# Patient Record
Sex: Female | Born: 1980 | Race: Black or African American | Hispanic: No | Marital: Married | Smoking: Never smoker
Health system: Southern US, Community
[De-identification: ages and names within clinical notes are randomized; demographics above are authoritative.]

## PROBLEM LIST (undated history)

## (undated) DIAGNOSIS — Z9889 Other specified postprocedural states: Secondary | ICD-10-CM

## (undated) DIAGNOSIS — R590 Localized enlarged lymph nodes: Secondary | ICD-10-CM

## (undated) DIAGNOSIS — N361 Urethral diverticulum: Secondary | ICD-10-CM

## (undated) DIAGNOSIS — C8110 Nodular sclerosis classical Hodgkin lymphoma, unspecified site: Secondary | ICD-10-CM

## (undated) DIAGNOSIS — R519 Headache, unspecified: Secondary | ICD-10-CM

## (undated) DIAGNOSIS — Z889 Allergy status to unspecified drugs, medicaments and biological substances status: Secondary | ICD-10-CM

## (undated) DIAGNOSIS — N750 Cyst of Bartholin's gland: Secondary | ICD-10-CM

## (undated) DIAGNOSIS — R112 Nausea with vomiting, unspecified: Secondary | ICD-10-CM

## (undated) DIAGNOSIS — D649 Anemia, unspecified: Secondary | ICD-10-CM

## (undated) DIAGNOSIS — C8190 Hodgkin lymphoma, unspecified, unspecified site: Secondary | ICD-10-CM

## (undated) HISTORY — DX: Urethral diverticulum: N36.1

## (undated) HISTORY — PX: OTHER SURGICAL HISTORY: SHX169

---
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
## 2006-03-27 ENCOUNTER — Other Ambulatory Visit: Admission: RE | Admit: 2006-03-27 | Discharge: 2006-03-27 | Payer: Self-pay | Admitting: Obstetrics and Gynecology

## 2007-11-11 DIAGNOSIS — R519 Headache, unspecified: Secondary | ICD-10-CM | POA: Insufficient documentation

## 2007-11-11 DIAGNOSIS — J310 Chronic rhinitis: Secondary | ICD-10-CM | POA: Insufficient documentation

## 2007-11-12 DIAGNOSIS — R3915 Urgency of urination: Secondary | ICD-10-CM | POA: Insufficient documentation

## 2010-08-03 ENCOUNTER — Ambulatory Visit: Payer: Self-pay | Admitting: Physician Assistant

## 2010-08-03 LAB — CONVERTED CEMR LAB
Eosinophils Absolute: 0.1 10*3/uL (ref 0.0–0.7)
HIV: NONREACTIVE
Hepatitis B Surface Ag: NEGATIVE
Hgb A2 Quant: 2.5 % (ref 2.2–3.2)
Hgb S Quant: 0 % (ref 0.0–0.0)
Lymphocytes Relative: 32 % (ref 12–46)
Lymphs Abs: 2.9 10*3/uL (ref 0.7–4.0)
MCV: 93.2 fL (ref 78.0–100.0)
Neutrophils Relative %: 62 % (ref 43–77)
Nitrite: NEGATIVE
Platelets: 244 10*3/uL (ref 150–400)
Protein, ur: NEGATIVE mg/dL
Urine Glucose: NEGATIVE mg/dL
Urobilinogen, UA: 1 (ref 0.0–1.0)
WBC: 9.2 10*3/uL (ref 4.0–10.5)

## 2010-08-04 ENCOUNTER — Encounter: Payer: Self-pay | Admitting: Physician Assistant

## 2010-08-06 LAB — HM PAP SMEAR: HM Pap smear: NEGATIVE

## 2010-08-31 ENCOUNTER — Ambulatory Visit: Payer: Self-pay | Admitting: Family

## 2010-09-25 ENCOUNTER — Ambulatory Visit (HOSPITAL_COMMUNITY)
Admission: RE | Admit: 2010-09-25 | Discharge: 2010-09-25 | Payer: Self-pay | Source: Home / Self Care | Attending: Obstetrics and Gynecology | Admitting: Obstetrics and Gynecology

## 2010-09-28 ENCOUNTER — Ambulatory Visit
Admission: RE | Admit: 2010-09-28 | Discharge: 2010-09-28 | Payer: Self-pay | Source: Home / Self Care | Attending: Family | Admitting: Family

## 2010-10-26 DIAGNOSIS — Z3480 Encounter for supervision of other normal pregnancy, unspecified trimester: Secondary | ICD-10-CM

## 2010-10-29 ENCOUNTER — Other Ambulatory Visit: Payer: Self-pay | Admitting: Obstetrics & Gynecology

## 2010-10-29 DIAGNOSIS — Z3689 Encounter for other specified antenatal screening: Secondary | ICD-10-CM

## 2010-11-19 ENCOUNTER — Ambulatory Visit (HOSPITAL_COMMUNITY)
Admission: RE | Admit: 2010-11-19 | Discharge: 2010-11-19 | Disposition: A | Discharge status: Home / Self Care | Payer: 59 | Source: Ambulatory Visit | Attending: Obstetrics & Gynecology | Admitting: Obstetrics & Gynecology

## 2010-11-19 DIAGNOSIS — O358XX0 Maternal care for other (suspected) fetal abnormality and damage, not applicable or unspecified: Secondary | ICD-10-CM | POA: Insufficient documentation

## 2010-11-19 DIAGNOSIS — Z3689 Encounter for other specified antenatal screening: Secondary | ICD-10-CM | POA: Insufficient documentation

## 2010-11-20 ENCOUNTER — Encounter: Payer: Self-pay | Admitting: Obstetrics & Gynecology

## 2010-11-20 LAB — CONVERTED CEMR LAB
Basophils Relative: 0 % (ref 0–1)
Eosinophils Absolute: 0.1 10*3/uL (ref 0.0–0.7)
Eosinophils Relative: 1 % (ref 0–5)
HCT: 34.9 % — ABNORMAL LOW (ref 36.0–46.0)
Hemoglobin: 11.9 g/dL — ABNORMAL LOW (ref 12.0–15.0)
MCHC: 34.1 g/dL (ref 30.0–36.0)
MCV: 97.2 fL (ref 78.0–100.0)
Monocytes Absolute: 0.6 10*3/uL (ref 0.1–1.0)
Monocytes Relative: 6 % (ref 3–12)
RBC: 3.59 M/uL — ABNORMAL LOW (ref 3.87–5.11)

## 2010-11-30 DIAGNOSIS — O34219 Maternal care for unspecified type scar from previous cesarean delivery: Secondary | ICD-10-CM

## 2010-11-30 DIAGNOSIS — Z3480 Encounter for supervision of other normal pregnancy, unspecified trimester: Secondary | ICD-10-CM

## 2010-12-14 DIAGNOSIS — Z3480 Encounter for supervision of other normal pregnancy, unspecified trimester: Secondary | ICD-10-CM

## 2010-12-28 DIAGNOSIS — Z3480 Encounter for supervision of other normal pregnancy, unspecified trimester: Secondary | ICD-10-CM

## 2010-12-31 DIAGNOSIS — Z3480 Encounter for supervision of other normal pregnancy, unspecified trimester: Secondary | ICD-10-CM

## 2010-12-31 DIAGNOSIS — N361 Urethral diverticulum: Secondary | ICD-10-CM

## 2010-12-31 DIAGNOSIS — O34219 Maternal care for unspecified type scar from previous cesarean delivery: Secondary | ICD-10-CM

## 2011-01-16 ENCOUNTER — Encounter (INDEPENDENT_AMBULATORY_CARE_PROVIDER_SITE_OTHER): Payer: 59 | Admitting: Obstetrics & Gynecology

## 2011-01-16 DIAGNOSIS — Z3480 Encounter for supervision of other normal pregnancy, unspecified trimester: Secondary | ICD-10-CM

## 2011-01-30 ENCOUNTER — Encounter (INDEPENDENT_AMBULATORY_CARE_PROVIDER_SITE_OTHER): Payer: 59 | Admitting: Obstetrics & Gynecology

## 2011-01-30 DIAGNOSIS — O99019 Anemia complicating pregnancy, unspecified trimester: Secondary | ICD-10-CM

## 2011-01-30 DIAGNOSIS — Z3480 Encounter for supervision of other normal pregnancy, unspecified trimester: Secondary | ICD-10-CM

## 2011-02-06 ENCOUNTER — Encounter (INDEPENDENT_AMBULATORY_CARE_PROVIDER_SITE_OTHER): Payer: 59 | Admitting: Obstetrics & Gynecology

## 2011-02-06 DIAGNOSIS — Z3480 Encounter for supervision of other normal pregnancy, unspecified trimester: Secondary | ICD-10-CM

## 2011-02-15 ENCOUNTER — Encounter (INDEPENDENT_AMBULATORY_CARE_PROVIDER_SITE_OTHER): Payer: 59

## 2011-02-15 DIAGNOSIS — O99019 Anemia complicating pregnancy, unspecified trimester: Secondary | ICD-10-CM

## 2011-02-15 DIAGNOSIS — O34219 Maternal care for unspecified type scar from previous cesarean delivery: Secondary | ICD-10-CM

## 2011-02-15 DIAGNOSIS — IMO0002 Reserved for concepts with insufficient information to code with codable children: Secondary | ICD-10-CM

## 2011-02-15 DIAGNOSIS — Z3480 Encounter for supervision of other normal pregnancy, unspecified trimester: Secondary | ICD-10-CM

## 2011-02-20 ENCOUNTER — Inpatient Hospital Stay (HOSPITAL_COMMUNITY)
Admission: AD | Admit: 2011-02-20 | Discharge: 2011-02-22 | DRG: 775 | Disposition: A | Discharge status: Home / Self Care | Payer: 59 | Source: Ambulatory Visit | Attending: Family Medicine | Admitting: Family Medicine

## 2011-02-20 DIAGNOSIS — O99892 Other specified diseases and conditions complicating childbirth: Secondary | ICD-10-CM | POA: Diagnosis present

## 2011-02-20 DIAGNOSIS — O324XX0 Maternal care for high head at term, not applicable or unspecified: Secondary | ICD-10-CM

## 2011-02-20 DIAGNOSIS — O34219 Maternal care for unspecified type scar from previous cesarean delivery: Secondary | ICD-10-CM | POA: Diagnosis present

## 2011-02-20 DIAGNOSIS — Z22330 Carrier of Group B streptococcus: Secondary | ICD-10-CM

## 2011-02-20 LAB — RPR: RPR Ser Ql: NONREACTIVE

## 2011-02-20 LAB — CBC
HCT: 37.7 % (ref 36.0–46.0)
Hemoglobin: 12.7 g/dL (ref 12.0–15.0)
MCHC: 33.7 g/dL (ref 30.0–36.0)
MCV: 97.2 fL (ref 78.0–100.0)
RDW: 13.6 % (ref 11.5–15.5)

## 2011-02-21 LAB — CBC
Hemoglobin: 9.4 g/dL — ABNORMAL LOW (ref 12.0–15.0)
MCHC: 33.5 g/dL (ref 30.0–36.0)
RDW: 13.8 % (ref 11.5–15.5)

## 2011-02-22 NOTE — Op Note (Addendum)
  NAMEELBONY, Elizabeth Beasley            ACCOUNT NO.:  [id number]  MEDICAL RECORD NO.:  [id number]  LOCATION:  9108                          FACILITY:  [facility]  PHYSICIAN:  Allie Bossier, MD        DATE OF BIRTH:  1981/05/08  DATE OF PROCEDURE:  02/20/2011 DATE OF DISCHARGE:                              OPERATIVE REPORT   PREOPERATIVE DIAGNOSES:  Maternal exhaustion and arrest of descent.  POSTOPERATIVE DIAGNOSES:  Maternal exhaustion and arrest of descent.  PROCEDURE:  Vacuum-assisted vaginal birth after cesarean section.  COMPLICATIONS:  With a second-degree laceration.  ESTIMATED BLOOD LOSS:  500 mL.  INDICATIONS:  This is a 30 year old gravida 2, para 1 at 36 weeks and 6 days with an intrauterine pregnancy with history of previous C-section who presented with spontaneous onset of labor and desired a trial of labor.  After cesarean section, her labor course was complicated by chorioamnionitis for which she was started on gentamicin.  She did push for 2 hours with not much moving of the fetal head.  Fetal presentation was assessed and was found to be in OA presentation.  Due to fetal tachycardia and maternal exhaustion, it was discussed with the patient for vacuum-assisted vaginal delivery.  Risks and benefits were discussed with the patient including but not limited to bleeding, pain, infection, possible 3rd or 4th degrees laceration, episiotomy, cephalohematoma, subdural hematoma for the baby or additional procedures that need to be done.  The patient expressed understanding and agreed to proceed with a vacuum-assisted vaginal delivery.  PROCEDURE IN DETAIL:  After informed consent was obtained, a Kiwi vacuum was then placed on the infant's head.  It was inflated to the green for 1 application, 3 pulls, no pop off after presentation and was deflated, infant was delivered otherwise in LAO presentation of spontaneous cry. Mouth and nose were bulb suctioned.  Cord was cut and clamped  and infant was handed off to awaiting RN.  Apgars were 9 and 9.  Weight was 6 pounds 10 ounces.  Cord blood was sampled.  Placenta was delivered spontaneously intact with three-vessel cord.  The fundus was firm with fundal massage and Pitocin.  The patient had a second-degree laceration that was slightly extensive superficially.  Rectal exam was performed. The patient had good rectal tone and rectal mucosa and muscle were found to be intact.  I proceeded with the repair.  Again as mentioned with 3-0 Vicryl in a normal fashion, hemostasis was found to be adequate.  The patient tolerated the procedure well.  Additional local anesthesia was then used, approximately 25 mL of 1% lidocaine.  The patient tolerated the procedure well and the patient was taken back to postpartum area in stable condition.  We will check a CBC in the morning for blood loss and chorioamnionitis, otherwise mother is recovering in stable condition.    ______________________________ Elizabeth Kaufmann, MD   ______________________________ Allie Bossier, MD    LC/MEDQ  D:  02/20/2011  T:  02/21/2011  Job:  [id number]  Electronically Signed by Nicholaus Bloom MD on 03/12/2011 09:15:34 AM Electronically Signed by Elizabeth Kaufmann MD on 03/26/2011 08:45:15 AM

## 2011-04-03 ENCOUNTER — Ambulatory Visit: Payer: 59 | Admitting: Obstetrics & Gynecology

## 2011-04-05 ENCOUNTER — Encounter: Payer: Self-pay | Admitting: *Deleted

## 2011-04-05 ENCOUNTER — Ambulatory Visit: Payer: 59

## 2011-04-05 DIAGNOSIS — N361 Urethral diverticulum: Secondary | ICD-10-CM | POA: Insufficient documentation

## 2011-04-05 HISTORY — DX: Urethral diverticulum: N36.1

## 2011-04-15 ENCOUNTER — Other Ambulatory Visit (HOSPITAL_COMMUNITY): Payer: Self-pay | Admitting: *Deleted

## 2011-04-18 ENCOUNTER — Other Ambulatory Visit (HOSPITAL_COMMUNITY): Payer: Self-pay | Admitting: *Deleted

## 2011-04-18 DIAGNOSIS — N361 Urethral diverticulum: Secondary | ICD-10-CM

## 2011-04-24 ENCOUNTER — Other Ambulatory Visit (HOSPITAL_COMMUNITY): Payer: Self-pay | Admitting: *Deleted

## 2011-04-24 ENCOUNTER — Ambulatory Visit (HOSPITAL_COMMUNITY)
Admission: RE | Admit: 2011-04-24 | Discharge: 2011-04-24 | Disposition: A | Discharge status: Home / Self Care | Payer: 59 | Source: Ambulatory Visit | Attending: Diagnostic Radiology | Admitting: Diagnostic Radiology

## 2011-04-24 DIAGNOSIS — Z0189 Encounter for other specified special examinations: Secondary | ICD-10-CM | POA: Insufficient documentation

## 2011-04-24 DIAGNOSIS — N361 Urethral diverticulum: Secondary | ICD-10-CM

## 2011-04-24 MED ORDER — GADOBENATE DIMEGLUMINE 529 MG/ML IV SOLN
20.0000 mL | Freq: Once | INTRAVENOUS | Status: AC
  Administered 2011-04-24: 20 mL via INTRAVENOUS

## 2011-08-14 ENCOUNTER — Ambulatory Visit (INDEPENDENT_AMBULATORY_CARE_PROVIDER_SITE_OTHER): Payer: 59 | Admitting: Family Medicine

## 2011-08-14 ENCOUNTER — Encounter: Payer: Self-pay | Admitting: Family Medicine

## 2011-08-14 VITALS — BP 104/53 | HR 75 | Ht 65.0 in | Wt 204.0 lb

## 2011-08-14 DIAGNOSIS — E669 Obesity, unspecified: Secondary | ICD-10-CM

## 2011-08-14 MED ORDER — PHENTERMINE HCL 37.5 MG PO CAPS: 37.5000 mg | ORAL_CAPSULE | ORAL | Status: DC

## 2011-08-14 NOTE — Progress Notes (Signed)
Subjective:    Patient ID: Elizabeth Beasley, female    DOB: [birth year], 30 y.o.   MRN: [id number]  HPI Weight Loss - Tryeing to be mindful of what she eating. No fried foods.  Working out 3 times a week. Tried to walk at work a cuple of times a week. Was caloried counting. Very little soda. Has only lost 3 lbs with her diet and exercise efforts.  Doesn't eat breakfast. No family hx of thryoid problems. No prior hx of heart problems. Pre-preg weight was 192.  Feels her weight has platuead.    Review of Systems  Constitutional: Negative for fever, diaphoresis and unexpected weight change.  HENT: Negative for hearing loss, rhinorrhea and tinnitus.   Eyes: Negative for visual disturbance.  Respiratory: Negative for cough and wheezing.   Cardiovascular: Negative for chest pain and palpitations.  Gastrointestinal: Negative for nausea, vomiting, diarrhea and blood in stool.  Genitourinary: Negative for vaginal bleeding, vaginal discharge and difficulty urinating.  Musculoskeletal: Negative for myalgias and arthralgias.  Skin: Negative for rash.  Neurological: Negative for headaches.  Hematological: Negative for adenopathy. Does not bruise/bleed easily.  Psychiatric/Behavioral: Negative for sleep disturbance and dysphoric mood. The patient is not nervous/anxious.        BP 104/53  Pulse 75  Ht 5\' 5"  (1.651 m)  Wt 204 lb (92.534 kg)  BMI 33.95 kg/m2    No Known Allergies  Past Medical History  Diagnosis Date  . Urethral diverticulum     Past Surgical History  Procedure Date  . Cesarean section 2/10    Breech    History   Social History  . Marital Status: Married    Spouse Name: Ladene Artist    Number of Children: 2  . Years of Education: Assoc   Occupational History  . CMA    Social History Main Topics  . Smoking status: Never Smoker   . Smokeless tobacco: Never Used  . Alcohol Use: No  . Drug Use: No  . Sexually Active: Yes -- Female partner(s)    Birth Control/  Protection: Condom   Other Topics Concern  . Not on file   Social History Narrative   Regular exercise. No regular caffeine.    Family History  Problem Relation Age of Onset  . Colon cancer Father   . Colon polyps Mother   . Hypertension Mother   . Diabetes Paternal Grandmother   . Stroke Paternal Grandmother   . Alcohol abuse Paternal Uncle   . Colon cancer Paternal Aunt     Objective:   Physical Exam  Constitutional: She is oriented to person, place, and time. She appears well-developed and well-nourished.  HENT:  Head: Normocephalic and atraumatic.  Cardiovascular: Normal rate, regular rhythm and normal heart sounds.   Pulmonary/Chest: Effort normal and breath sounds normal.  Musculoskeletal: She exhibits no edema.  Neurological: She is alert and oriented to person, place, and time.  Skin: Skin is warm and dry.  Psychiatric: She has a normal mood and affect. Her behavior is normal.          Assessment & Plan:  Obesity-BMI is 33. We discussed different options including the phentermine which is most interested in. We discussed potential side effects of the medication. If she exhibits any chest pain or shortness of breath she is to stop immediately. We also discussed the importance of following up every month for blood pressure and weight checks to make sure this is well controlled. If she does not lose weight on  the medication then we will not refill it. She agrees to care plan. Also discussed the importance of continuing with eating healthy and exercising regularly. She may need to also go back to calorie counting as I think this might be helpful for her, though it is difficult and time-consuming. Followup one month. I did encourage her to breakfast regularly. Also consider checking a TSH.

## 2011-08-14 NOTE — Patient Instructions (Signed)
Follow up in one month for Blood pressure and weight check.

## 2011-08-21 ENCOUNTER — Ambulatory Visit (INDEPENDENT_AMBULATORY_CARE_PROVIDER_SITE_OTHER): Payer: 59 | Admitting: Family Medicine

## 2011-08-21 VITALS — BP 119/72 | HR 105 | Temp 98.5°F

## 2011-08-21 DIAGNOSIS — J029 Acute pharyngitis, unspecified: Secondary | ICD-10-CM

## 2011-08-21 LAB — POCT RAPID STREP A (OFFICE): Rapid Strep A Screen: NEGATIVE

## 2011-08-21 MED ORDER — PENICILLIN V POTASSIUM 500 MG PO TABS: 500.0000 mg | ORAL_TABLET | Freq: Two times a day (BID) | ORAL | Status: DC

## 2011-08-21 NOTE — Progress Notes (Signed)
  Subjective:    Patient ID: Elizabeth Beasley, female    DOB: 1981-02-28, 30 y.o.   MRN: [id number] Pt here for strep test. White patch on back of throat, painful sore throat x1 day- feels like knife stabbing in it, chills her to have had upper respiratory infections and one child has an ear infection. No fever. HPI    Review of Systems     Objective:   Physical Exam        Assessment & Plan:  Pharyngitis-it could be strep or viral etiology. Because she does have a white spot in her pain is severe I will have prescribed penicillin, as she denies any allergies. And sent for culture as well. If it returns and is negative then she can stop the antibiotic. In the meantime salt water gargles and lozenges for discomfort.

## 2011-09-20 ENCOUNTER — Ambulatory Visit: Payer: 59 | Admitting: Family Medicine

## 2011-09-20 DIAGNOSIS — E669 Obesity, unspecified: Secondary | ICD-10-CM

## 2011-09-20 MED ORDER — PHENTERMINE HCL 37.5 MG PO CAPS: 37.5000 mg | ORAL_CAPSULE | ORAL | Status: DC

## 2011-09-20 NOTE — Progress Notes (Signed)
  Subjective:    Patient ID: Elizabeth Beasley, female    DOB: 1980-11-14, 31 y.o.   MRN: [id number] WT Check; needs Phenteramine refill HPI    Review of Systems     Objective:   Physical Exam        Assessment & Plan:

## 2011-09-20 NOTE — Progress Notes (Signed)
  Subjective:    Patient ID: Elizabeth Beasley, female    DOB: 18-Nov-1980, 31 y.o.   MRN: [id number]  HPI Here for weight/ BP check on phentermine. Doing well. No SE or CP or dizziness. Exercising and eating healthy.    Review of Systems     Objective:   Physical Exam        Assessment & Plan:  Weight Loss - Has lost 4 lbs. Doing well.  BP at goal. Will refill her phentermine.  F/U in 1 mo for BP and weight check.

## 2011-09-27 ENCOUNTER — Ambulatory Visit: Payer: 59

## 2011-10-01 ENCOUNTER — Telehealth: Payer: Self-pay | Admitting: *Deleted

## 2011-10-01 DIAGNOSIS — R635 Abnormal weight gain: Secondary | ICD-10-CM

## 2011-10-01 DIAGNOSIS — R5383 Other fatigue: Secondary | ICD-10-CM

## 2011-10-01 NOTE — Telephone Encounter (Signed)
Lab order entered.

## 2011-10-25 ENCOUNTER — Ambulatory Visit: Payer: 59 | Admitting: Family Medicine

## 2011-10-25 MED ORDER — PHENTERMINE HCL 37.5 MG PO CAPS: 37.5000 mg | ORAL_CAPSULE | ORAL | Status: DC

## 2011-10-25 NOTE — Progress Notes (Signed)
  Subjective:    Patient ID: Elizabeth Beasley, female    DOB: [birth year], 31 y.o.   MRN: [id number]  HPI  Here for BP and weight check. On phentermine. Dong well. No SE, She is doing zumba for exercise.   Review of Systems     Objective:   Physical Exam        Assessment & Plan:  Overweight - BP under control. Has last 7 lbs in 1 months. Will refill meds. F/U in 1 mo for BP and weight check.

## 2011-10-25 NOTE — Progress Notes (Signed)
  Subjective:    Patient ID: Elizabeth Beasley, female    DOB: [birth year], 31 y.o.   MRN: [id number] BP check and weight check. [facility] HPI    Review of Systems     Objective:   Physical Exam        Assessment & Plan:

## 2011-11-29 ENCOUNTER — Ambulatory Visit: Payer: 59 | Admitting: Family Medicine

## 2011-11-29 VITALS — BP 102/62 | HR 98 | Ht 64.5 in | Wt 187.0 lb

## 2011-11-29 DIAGNOSIS — E663 Overweight: Secondary | ICD-10-CM

## 2011-11-29 MED ORDER — PHENTERMINE HCL 37.5 MG PO CAPS: 37.5000 mg | ORAL_CAPSULE | ORAL | Status: DC

## 2011-11-29 NOTE — Progress Notes (Signed)
Patient ID: Elizabeth Beasley, female   DOB: 30-Jun-1981, 31 y.o.   MRN: [id number]  Pt denies chest pain, SOB, dizziness, or heart palpitations.  Taking meds as directed w/o problems.  Denies medication side effects.  5 min spent with pt. Patient doing well on appetite suppressant.  Here for nurse visit, weight, BP, HR check.  Denies problems with insomnia, heart palpitations or tremors.  Satisfied with weight loss thus far and is working on [employer] and regular exercise.      Oveweight - BP well controlled. Weight is down 4 pounds. I will refill her phentermine. Followup in one month for repeat blood pressure and weight check. C.Metheney, MD

## 2012-01-07 ENCOUNTER — Telehealth: Payer: Self-pay | Admitting: *Deleted

## 2012-01-07 NOTE — Telephone Encounter (Signed)
Pt received bill from insurance company for recent visits. Pt called insurance company and was told that they do not cover appointments for obesity. She was also told that provider may consider changing Dx code and resubmit claim. Please advise.

## 2012-01-10 ENCOUNTER — Telehealth: Payer: Self-pay | Admitting: *Deleted

## 2012-01-10 MED ORDER — PHENTERMINE HCL 37.5 MG PO CAPS: 37.5000 mg | ORAL_CAPSULE | ORAL | Status: DC

## 2012-01-10 NOTE — Telephone Encounter (Signed)
Pt requests refill on phentermine. Weight today 182. BP today is 102/63.

## 2012-01-10 NOTE — Telephone Encounter (Signed)
Ok to refill. Recheck in one month.

## 2012-01-22 NOTE — Telephone Encounter (Signed)
Will talk to [facility].

## 2012-02-26 ENCOUNTER — Ambulatory Visit (INDEPENDENT_AMBULATORY_CARE_PROVIDER_SITE_OTHER): Payer: 59 | Admitting: Family Medicine

## 2012-02-26 VITALS — BP 111/68 | HR 97 | Wt 179.0 lb

## 2012-02-26 DIAGNOSIS — R635 Abnormal weight gain: Secondary | ICD-10-CM

## 2012-02-26 MED ORDER — PHENTERMINE HCL 37.5 MG PO CAPS: 37.5000 mg | ORAL_CAPSULE | ORAL | Status: DC

## 2012-02-26 NOTE — Progress Notes (Signed)
  Subjective:    Patient ID: Elizabeth Beasley, female    DOB: [birth year], 31 y.o.   MRN: [id number] BP and Wt check HPI    Review of Systems     Objective:   Physical Exam        Assessment & Plan:  Abnormal weight gain - Down 3 lbs in one month.  BP at goal. Ok to refill med.  F/U in one month. Trying to exercise some C. Metheney, CM.

## 2012-04-17 ENCOUNTER — Ambulatory Visit (INDEPENDENT_AMBULATORY_CARE_PROVIDER_SITE_OTHER): Payer: 59 | Admitting: Family Medicine

## 2012-04-17 VITALS — BP 106/66 | HR 86 | Ht 64.5 in | Wt 177.0 lb

## 2012-04-17 DIAGNOSIS — R635 Abnormal weight gain: Secondary | ICD-10-CM

## 2012-04-17 NOTE — Progress Notes (Signed)
Patient ID: Elizabeth Beasley, female   DOB: [birth year], 31 y.o.   MRN: [id number] Patient doing well on appetite suppressant.  Here for nurse visit, weight, BP, HR check.  Denies problems with insomnia, heart palpitations or tremors.  Satisfied with weight loss thus far and is working on [employer] and regular exercise.     ABnormal Weight Gain - Only lost 2 lbs. Will refill for one more mont. Work on increasing exercise.  Nani Gasser, MD

## 2012-04-20 MED ORDER — PHENTERMINE HCL 37.5 MG PO CAPS: 37.5000 mg | ORAL_CAPSULE | ORAL | Status: DC

## 2012-08-19 ENCOUNTER — Ambulatory Visit (INDEPENDENT_AMBULATORY_CARE_PROVIDER_SITE_OTHER): Payer: 59 | Admitting: Family Medicine

## 2012-08-19 DIAGNOSIS — R51 Headache: Secondary | ICD-10-CM

## 2012-08-19 MED ORDER — KETOROLAC TROMETHAMINE 30 MG/ML IJ SOLN
30.0000 mg | Freq: Once | INTRAMUSCULAR | Status: AC
  Administered 2012-08-19: 30 mg via INTRAMUSCULAR

## 2012-08-19 NOTE — Progress Notes (Signed)
Tolerated well without complication, headache improved.

## 2012-08-19 NOTE — Progress Notes (Signed)
  Subjective:    Patient ID: Elizabeth Beasley, female    DOB: 1981-06-29, 31 y.o.   MRN: [id number]   Vola tolerated injection well.    HPI    Review of Systems     Objective:   Physical Exam        Assessment & Plan:

## 2012-10-05 ENCOUNTER — Encounter: Payer: Self-pay | Admitting: Family Medicine

## 2012-10-05 ENCOUNTER — Ambulatory Visit (INDEPENDENT_AMBULATORY_CARE_PROVIDER_SITE_OTHER): Payer: 59 | Admitting: Family Medicine

## 2012-10-05 VITALS — BP 112/72 | HR 75 | Wt 178.0 lb

## 2012-10-05 DIAGNOSIS — R42 Dizziness and giddiness: Secondary | ICD-10-CM

## 2012-10-05 NOTE — Progress Notes (Signed)
Subjective:    Patient ID: Elizabeth Beasley, female    DOB: October 11, 1980, 32 y.o.   MRN: [id number]  HPI Says a week ago felt a little light headed while sitting at her desk at work, but then progressed to dizziness.  Says did eat some lunch and sugar was 115 about 2 hours later.  Then when laid down that afternoon felt like the room was spinning. Eversince then will get a sense of lightheaded.  No URI sxs.  Some itching in ears. No fever.  No vomiting or nausea.  Says has frequent HA but not different than usually. No vision changes.  No CP or palps or SOB.  Says not sure if drinking enough fluids during the day. She has been trying to eat more consistently..  No history of thyroid problems. She does have heavy periods. She has been anemic at one point in her life. She is not a smoker. She has no respiratory or cardiac disease. No recent swelling of the extremities.   Review of Systems  BP 112/72  Pulse 75  Wt 178 lb (80.74 kg)  SpO2 98%  LMP 09/02/2012    No Known Allergies  Past Medical History  Diagnosis Date  . Urethral diverticulum     Past Surgical History  Procedure Date  . Cesarean section 2/10    Breech    History   Social History  . Marital Status: Married    Spouse Name: Ladene Artist    Number of Children: 2  . Years of Education: Assoc   Occupational History  . CMA    Social History Main Topics  . Smoking status: Never Smoker   . Smokeless tobacco: Never Used  . Alcohol Use: No  . Drug Use: No  . Sexually Active: Yes -- Female partner(s)    Birth Control/ Protection: Condom   Other Topics Concern  . Not on file   Social History Narrative   Regular exercise. No regular caffeine.    Family History  Problem Relation Age of Onset  . Colon cancer Father   . Colon polyps Mother   . Hypertension Mother   . Diabetes Paternal Grandmother   . Stroke Paternal Grandmother   . Alcohol abuse Paternal Uncle   . Colon cancer Paternal Aunt     Outpatient  Encounter Prescriptions as of 10/05/2012  Medication Sig Dispense Refill  . [DISCONTINUED] phentermine 37.5 MG capsule Take 1 capsule (37.5 mg total) by mouth every morning.  30 capsule  0          Objective:   Physical Exam  Constitutional: She is oriented to person, place, and time. She appears well-developed and well-nourished.  HENT:  Head: Normocephalic and atraumatic.  Right Ear: External ear normal.  Left Ear: External ear normal.  Nose: Nose normal.  Mouth/Throat: Oropharynx is clear and moist.       TMs and canals are clear.   Eyes: Conjunctivae normal and EOM are normal. Pupils are equal, round, and reactive to light.  Neck: Neck supple. No thyromegaly present.  Cardiovascular: Normal rate, regular rhythm and normal heart sounds.   Pulmonary/Chest: Effort normal and breath sounds normal. She has no wheezes.  Abdominal: Soft.  Musculoskeletal: She exhibits no edema.  Lymphadenopathy:    She has no cervical adenopathy.  Neurological: She is alert and oriented to person, place, and time.  Skin: Skin is warm and dry.  Psychiatric: She has a normal mood and affect. Her behavior is normal.  Assessment & Plan:  Dizziness - unclear etiology at this point. Urine pregnancy test was negative. Orthostatics were normal. I did encourage her to work on more consistent hydration and eating more regularly. She may have had some benign positional vertigo initially based on her descriptions but now instead of true vertigo she's having more lightheadedness. I would like to do a CBC to evaluate for anemia. Especially with history of prior diagnosis of heavy periods. Also check for thyroid disease. Also check electrolytes. No CP or palpitations.  She has not hypoglycemic.

## 2012-11-26 ENCOUNTER — Ambulatory Visit: Payer: 59 | Admitting: Family Medicine

## 2012-12-11 ENCOUNTER — Ambulatory Visit (INDEPENDENT_AMBULATORY_CARE_PROVIDER_SITE_OTHER): Payer: 59 | Admitting: Family Medicine

## 2012-12-11 ENCOUNTER — Encounter: Payer: Self-pay | Admitting: Family Medicine

## 2012-12-11 VITALS — BP 104/65 | HR 74 | Ht 64.6 in | Wt 184.0 lb

## 2012-12-11 DIAGNOSIS — R635 Abnormal weight gain: Secondary | ICD-10-CM

## 2012-12-11 MED ORDER — PHENTERMINE HCL 37.5 MG PO CAPS: 37.5000 mg | ORAL_CAPSULE | ORAL | Status: DC

## 2012-12-11 NOTE — Progress Notes (Signed)
  Subjective:    Patient ID: Elizabeth Beasley, female    DOB: [birth year], 32 y.o.   MRN: [id number]  HPI  Says has been running for exercise and has been doing weight watchers and My[employer]itness Pal. Says she's been very strict about counting her calories overall. She is very frustrated because she's actually gained 6 pounds instead of actually losing. She denies any actual swelling or edema. Her rings still well. Goal weight of 160.  Her weight today is 184 pounds She and husband have ben working out togetherHas been using 20 min on the treadmill during lunch.    Review of Systems     Objective:   Physical Exam  Constitutional: She is oriented to person, place, and time. She appears well-developed and well-nourished.  HENT:  Head: Normocephalic and atraumatic.  Cardiovascular: Normal rate, regular rhythm and normal heart sounds.   Pulmonary/Chest: Effort normal and breath sounds normal.  Neurological: She is alert and oriented to person, place, and time.  Skin: Skin is warm and dry.  Psychiatric: She has a normal mood and affect. Her behavior is normal.          Assessment & Plan:  Abnormal WT gain- Discussed options. She really sounds like she is doing fantastic job with exercise and diet.  She had tolerated phentermine well in past with no SE such as elevated BP, CP or SOB  Will restart med.  F/U in 1 mo for BP and weight check.  Call if any S.E. Continue exercise and diet routine.

## 2013-02-09 ENCOUNTER — Ambulatory Visit (INDEPENDENT_AMBULATORY_CARE_PROVIDER_SITE_OTHER): Payer: 59 | Admitting: Family Medicine

## 2013-02-09 ENCOUNTER — Encounter: Payer: Self-pay | Admitting: Family Medicine

## 2013-02-09 VITALS — BP 117/70 | HR 81 | Wt 182.0 lb

## 2013-02-09 DIAGNOSIS — N342 Other urethritis: Secondary | ICD-10-CM

## 2013-02-09 DIAGNOSIS — N390 Urinary tract infection, site not specified: Secondary | ICD-10-CM

## 2013-02-09 DIAGNOSIS — N361 Urethral diverticulum: Secondary | ICD-10-CM

## 2013-02-09 LAB — POCT URINALYSIS DIPSTICK
Bilirubin, UA: NEGATIVE
Blood, UA: NEGATIVE
Glucose, UA: NEGATIVE
Ketones, UA: NEGATIVE
Spec Grav, UA: 1.025

## 2013-02-09 MED ORDER — CIPROFLOXACIN HCL 500 MG PO TABS: 500.0000 mg | ORAL_TABLET | Freq: Two times a day (BID) | ORAL | Status: AC

## 2013-02-09 NOTE — Progress Notes (Signed)
  Subjective:    Patient ID: Elizabeth Beasley, female    DOB: 1981-03-11, 32 y.o.   MRN: [id number]  HPI One week of irritaion at the urethra. Says not unusual but not usually this persistant. Has been a little crampy today. 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  Sex can irritate it as well, esp if has sex more than one day in a row.  She has called to schedule her pap smear soon. She is due anyway. She went online and read about adenocarcinomas developing in diverticulum so is worried. No fever, chills, sweats, or abdominal pain.  No weight loss.   Review of Systems     Objective:   Physical Exam  Constitutional: She appears well-developed and well-nourished.  HENT:  Head: Normocephalic and atraumatic.  Skin: Skin is warm and dry.  Psychiatric: She has a normal mood and affect. Her behavior is normal.          Assessment & Plan:  Urethritis - UA only + for LE.  Discussed optoins. Will tx for UTI with ciporo x 3 days. If nto better will send culture. Schedule pap and pelvic. Likley just some irritation of the urethra. No hx of abnormal paps.

## 2013-02-11 ENCOUNTER — Ambulatory Visit: Payer: 59 | Admitting: Sports Medicine

## 2013-02-25 ENCOUNTER — Ambulatory Visit: Payer: 59 | Admitting: Obstetrics & Gynecology

## 2013-03-16 ENCOUNTER — Ambulatory Visit (INDEPENDENT_AMBULATORY_CARE_PROVIDER_SITE_OTHER): Payer: 59 | Admitting: Family Medicine

## 2013-03-16 DIAGNOSIS — R51 Headache: Secondary | ICD-10-CM

## 2013-03-16 MED ORDER — KETOROLAC TROMETHAMINE 60 MG/2ML IM SOLN
60.0000 mg | Freq: Once | INTRAMUSCULAR | Status: AC
  Administered 2013-03-16: 60 mg via INTRAMUSCULAR

## 2013-03-16 NOTE — Progress Notes (Signed)
  Subjective:    Patient ID: Elizabeth Beasley, female    DOB: Apr 29, 1981, 32 y.o.   MRN: [id number] Toradol injection for headache.  Meyer Cory, LPN  HPI    Review of Systems     Objective:   Physical Exam        Assessment & Plan:

## 2013-03-16 NOTE — Progress Notes (Signed)
I was present for all necessary aspects of today's visit  She received this after experiencing a headache throughout the day despite acetaminophen and ibuprofen earlier

## 2013-06-14 ENCOUNTER — Ambulatory Visit: Payer: 59 | Admitting: Family Medicine

## 2013-07-09 ENCOUNTER — Telehealth: Payer: Self-pay | Admitting: Family Medicine

## 2013-07-09 MED ORDER — ZOLMITRIPTAN 5 MG NA SOLN: 1.0000 | NASAL | Status: DC | PRN

## 2013-07-09 NOTE — Telephone Encounter (Signed)
Given Zomig sample for migraines.

## 2013-08-11 ENCOUNTER — Encounter: Payer: Self-pay | Admitting: Physician Assistant

## 2013-08-11 ENCOUNTER — Ambulatory Visit (INDEPENDENT_AMBULATORY_CARE_PROVIDER_SITE_OTHER): Payer: 59 | Admitting: Physician Assistant

## 2013-08-11 VITALS — BP 107/66 | HR 83 | Wt 198.0 lb

## 2013-08-11 DIAGNOSIS — Z13220 Encounter for screening for lipoid disorders: Secondary | ICD-10-CM

## 2013-08-11 DIAGNOSIS — Z131 Encounter for screening for diabetes mellitus: Secondary | ICD-10-CM

## 2013-08-11 DIAGNOSIS — Z6833 Body mass index (BMI) 33.0-33.9, adult: Secondary | ICD-10-CM

## 2013-08-11 DIAGNOSIS — Z0000 Encounter for general adult medical examination without abnormal findings: Secondary | ICD-10-CM

## 2013-08-11 LAB — COMPLETE METABOLIC PANEL WITH GFR
ALT: 12 U/L (ref 0–35)
AST: 12 U/L (ref 0–37)
Creat: 0.67 mg/dL (ref 0.50–1.10)
GFR, Est African American: >89 mL/min
Total Bilirubin: 0.7 mg/dL (ref 0.3–1.2)

## 2013-08-11 LAB — LIPID PANEL
HDL: 55 mg/dL (ref >39)
LDL Cholesterol: 100 mg/dL — ABNORMAL HIGH (ref 0–99)
Triglycerides: 45 mg/dL (ref <150)
VLDL: 9 mg/dL (ref 0–40)

## 2013-08-11 NOTE — Progress Notes (Addendum)
  Subjective:     Elizabeth Beasley is a 32 y.o. female and is here for a comprehensive physical exam. The patient reports no problems.  History   Social History  . Marital Status: Married    Spouse Name: Ladene Artist    Number of Children: 2  . Years of Education: Assoc   Occupational History  . CMA    Social History Main Topics  . Smoking status: Never Smoker   . Smokeless tobacco: Never Used  . Alcohol Use: No  . Drug Use: No  . Sexual Activity: Yes    Partners: Male    Birth Control/ Protection: Condom   Other Topics Concern  . Not on file   Social History Narrative   Regular exercise. No regular caffeine.   Health Maintenance  Topic Date Due  . Pap Smear  08/06/2013  . Influenza Vaccine  04/16/2014  . Tetanus/tdap  09/17/2015    The following portions of the patient's history were reviewed and updated as appropriate: allergies, current medications, past family history, past medical history, past social history, past surgical history and problem list.  Review of Systems A comprehensive review of systems was negative.   Objective:    BP 107/66  Pulse 83  Wt 198 lb (89.812 kg)  LMP 08/11/2013 General appearance: alert, cooperative and appears stated age Head: Normocephalic, without obvious abnormality, atraumatic Eyes: conjunctivae/corneas clear. PERRL, EOM's intact. Fundi benign. Ears: normal TM's and external ear canals both ears Nose: Nares normal. Septum midline. Mucosa normal. No drainage or sinus tenderness. Throat: lips, mucosa, and tongue normal; teeth and gums normal Neck: no adenopathy, no carotid bruit, no JVD, supple, symmetrical, trachea midline and thyroid not enlarged, symmetric, no tenderness/mass/nodules Back: symmetric, no curvature. ROM normal. No CVA tenderness. Lungs: clear to auscultation bilaterally Heart: regular rate and rhythm, S1, S2 normal, no murmur, click, rub or gallop Abdomen: soft, non-tender; bowel sounds normal; no masses,  no  organomegaly Extremities: extremities normal, atraumatic, no cyanosis or edema Pulses: 2+ and symmetric Skin: Skin color, texture, turgor normal. No rashes or lesions Lymph nodes: Cervical, supraclavicular, and axillary nodes normal. Neurologic: Grossly normal    Assessment:    Healthy female exam.     Plan:    CPE- flu shot up-to-date. Patient has had a Pap the last 3 years. She was instructed in the next year to go to her OB/GYN can get and another Pap. Recommended starting calcium 1200 mg a day and vitamin D 800 units a day. Discussed weight gain since last visit. Talked about regular exercise at least 30 minutes a day. Patient has tried phentermine in the past and had good results. I briefly discussed other weight loss options. Patient will make another appointment to discuss weight loss options. BMI of 33 today. Depression Screen was negative. Screening lab slip was given to patient to get drawn today. See After Visit Summary for Counseling Recommendations

## 2013-08-11 NOTE — Patient Instructions (Signed)

## 2013-08-19 ENCOUNTER — Encounter: Payer: Self-pay | Admitting: Family Medicine

## 2013-08-19 ENCOUNTER — Ambulatory Visit (INDEPENDENT_AMBULATORY_CARE_PROVIDER_SITE_OTHER): Payer: 59 | Admitting: Family Medicine

## 2013-08-19 VITALS — BP 111/65 | HR 99 | Temp 98.1°F | Ht 64.6 in | Wt 193.0 lb

## 2013-08-19 DIAGNOSIS — R634 Abnormal weight loss: Secondary | ICD-10-CM

## 2013-08-19 MED ORDER — PHENTERMINE HCL 37.5 MG PO TABS: 37.5000 mg | ORAL_TABLET | Freq: Every day | ORAL | Status: DC

## 2013-08-19 NOTE — Progress Notes (Signed)
   Subjective:    Patient ID: Elizabeth Beasley, female    DOB: 1981/03/02, 32 y.o.   MRN: [id number]  HPI Overweight - She would like to get back on the phentermine. She wants to be 170 lbs.  she had taken it previously and did well. She had not experienced any chest pain, shortness of breath, palpitations. She said it does affect her sleep the first week that she took it but that seemed to level out. She eventually just quit exercising regularly and wasn't eating quite as strictly. And so discontinued the medication. She's ready to get back on track and plans to use my fitness PAL as well as planning to start an exercise regimen. Plans on buying an exercise bike for her home since hard time finding time to exercise.    Review of Systems     Objective:   Physical Exam  Constitutional: She is oriented to person, place, and time. She appears well-developed and well-nourished.  HENT:  Head: Normocephalic and atraumatic.  Cardiovascular: Normal rate, regular rhythm and normal heart sounds.   Pulmonary/Chest: Effort normal and breath sounds normal.  Neurological: She is alert and oriented to person, place, and time.  Skin: Skin is warm and dry.  Psychiatric: She has a normal mood and affect. Her behavior is normal.          Assessment & Plan:  Abnormal weight gain - we'll restart phentermine. Call immediately if any side effects or problems. Follow up in one month for nurse blood pressure weight check. Restart my fitness PAL to help tolerate count and recommend starting some type of exercise routine if it if it only 10-15 minutes a few days a week. Until she's able to get a home exercise bike. Goal is to lose 4-5 pounds per month.  Time spent 15 min , > 50% counseling about abnormal weight gain and need for weight loss.

## 2013-09-22 ENCOUNTER — Ambulatory Visit (INDEPENDENT_AMBULATORY_CARE_PROVIDER_SITE_OTHER): Payer: 59 | Admitting: Family Medicine

## 2013-09-22 ENCOUNTER — Encounter: Payer: Self-pay | Admitting: Family Medicine

## 2013-09-22 VITALS — BP 121/67 | HR 101 | Ht 64.6 in | Wt 191.0 lb

## 2013-09-22 DIAGNOSIS — J329 Chronic sinusitis, unspecified: Secondary | ICD-10-CM

## 2013-09-22 DIAGNOSIS — A499 Bacterial infection, unspecified: Secondary | ICD-10-CM

## 2013-09-22 DIAGNOSIS — B9689 Other specified bacterial agents as the cause of diseases classified elsewhere: Secondary | ICD-10-CM

## 2013-09-22 MED ORDER — AMOXICILLIN-POT CLAVULANATE 500-125 MG PO TABS: ORAL_TABLET | ORAL | Status: AC

## 2013-09-22 NOTE — Progress Notes (Signed)
CC: Elizabeth Beasley is a 33 y.o. female is here for Facial Pain and Otalgia   Subjective: HPI:  Complains of 2-3 weeks the constellation of upper respiratory symptoms described as nasal congestion, subjective postnasal drip, and a cough which is the only thing that has resolved. For the past week above symptoms have been accompanied by pressure and slight discomfort underneath the left eye that radiates into the left ear. Symptoms are worse in cold environments nothing else makes better or worse. No benefit with over-the-counter analgesics.  Symptoms are worsening on a daily basis. Denies fevers, chills, motor sensory disturbances, shortness of breath nor dysphagia   Review Of Systems Outlined In HPI  Past Medical History  Diagnosis Date  . Urethral diverticulum      Family History  Problem Relation Age of Onset  . Colon cancer Father   . Colon polyps Mother   . Hypertension Mother   . Diabetes Paternal Grandmother   . Stroke Paternal Grandmother   . Alcohol abuse Paternal Uncle   . Colon cancer Paternal Aunt      History  Substance Use Topics  . Smoking status: Never Smoker   . Smokeless tobacco: Never Used  . Alcohol Use: No     Objective: Filed Vitals:   09/22/13 1520  BP: 121/67  Pulse: 101    General: Alert and Oriented, No Acute Distress HEENT: Pupils equal, round, reactive to light. Conjunctivae clear.  External ears unremarkable, canals clear with intact TMs with appropriate landmarks.  Middle ear appears open without effusion. Pink inferior turbinates.  Moist mucous membranes, pharynx without inflammation nor lesions however moderate cobblestoning.  Neck supple without palpable lymphadenopathy nor abnormal masses. Left maxillary sinus tenderness Lungs: Clear to auscultation bilaterally, no wheezing/ronchi/rales.  Comfortable work of breathing. Good air movement. Mental Status: No depression, anxiety, nor agitation. Skin: Warm and dry.  Assessment &  Plan: [name] was seen today for facial pain and otalgia.  Diagnoses and associated orders for this visit:  Bacterial sinusitis - amoxicillin-clavulanate (AUGMENTIN) 500-125 MG per tablet; Take one by mouth every 8 hours for ten total days.    Bacterial sinusitis: Start Augmentin consider continuing nasal saline washes and starting Advil or Alka-Seltzer cold and sinus  Return if symptoms worsen or fail to improve.

## 2013-09-27 ENCOUNTER — Ambulatory Visit (INDEPENDENT_AMBULATORY_CARE_PROVIDER_SITE_OTHER): Payer: 59 | Admitting: Family Medicine

## 2013-09-27 ENCOUNTER — Ambulatory Visit: Payer: 59 | Admitting: Physician Assistant

## 2013-09-27 VITALS — BP 98/60 | HR 88 | Wt 189.0 lb

## 2013-09-27 DIAGNOSIS — R635 Abnormal weight gain: Secondary | ICD-10-CM

## 2013-09-27 MED ORDER — PHENTERMINE HCL 37.5 MG PO TABS: 37.5000 mg | ORAL_TABLET | Freq: Every day | ORAL | Status: DC

## 2013-09-27 NOTE — Progress Notes (Signed)
   Subjective:    Patient ID: Elizabeth Beasley, female    DOB: [birth year], 33 y.o.   MRN: [id number]  HPI    Review of Systems     Objective:   Physical Exam        Assessment & Plan:  Abnormal weight gain-patient doing well overall. Has lost 4 pounds over the last 5 weeks with phentermine. Tolerating it well without any side effects. Will refill today. Follow up in one month for blood pressure weight check. Beatrice Lecher, MD

## 2013-09-27 NOTE — Progress Notes (Signed)
Patient was in office for weight check for medication. Rhonda Cunningham,CMA

## 2014-03-04 ENCOUNTER — Ambulatory Visit (INDEPENDENT_AMBULATORY_CARE_PROVIDER_SITE_OTHER): Payer: 59 | Admitting: Family Medicine

## 2014-03-04 ENCOUNTER — Encounter: Payer: Self-pay | Admitting: Family Medicine

## 2014-03-04 VITALS — BP 126/79 | HR 116 | Temp 98.2°F | Ht 64.6 in | Wt 189.0 lb

## 2014-03-04 DIAGNOSIS — J329 Chronic sinusitis, unspecified: Secondary | ICD-10-CM

## 2014-03-04 DIAGNOSIS — B9689 Other specified bacterial agents as the cause of diseases classified elsewhere: Secondary | ICD-10-CM

## 2014-03-04 DIAGNOSIS — A499 Bacterial infection, unspecified: Secondary | ICD-10-CM

## 2014-03-04 MED ORDER — AMOXICILLIN-POT CLAVULANATE 500-125 MG PO TABS: ORAL_TABLET | ORAL | Status: AC

## 2014-03-04 NOTE — Progress Notes (Signed)
CC: Sofiya Ezelle is a 33 y.o. female is here for Dizziness, Headache, Cough and Nasal Congestion   Subjective: HPI:  Nasal congestion, facial pressure beneath right and left eyes, nonproductive cough, fatigue that has been present for the past 2 weeks with some similar symptoms that began months ago. Symptoms are worse than a weekly basis. Present all hours of the day worse first thing in the morning. Interventions include nasal saline washes with no drastic improvement. Review of systems positive for mild shortness of breath. There been no fevers, chills, motor or sensory disturbances, ear drainage.   Review Of Systems Outlined In HPI  Past Medical History  Diagnosis Date  . Urethral diverticulum     Past Surgical History  Procedure Laterality Date  . Cesarean section  2/10    Breech   Family History  Problem Relation Age of Onset  . Colon cancer Father   . Colon polyps Mother   . Hypertension Mother   . Diabetes Paternal Grandmother   . Stroke Paternal Grandmother   . Alcohol abuse Paternal Uncle   . Colon cancer Paternal Aunt     History   Social History  . Marital Status: Married    Spouse Name: Montine Circle    Number of Children: 2  . Years of Education: Assoc   Occupational History  . CMA    Social History Main Topics  . Smoking status: Never Smoker   . Smokeless tobacco: Never Used  . Alcohol Use: No  . Drug Use: No  . Sexual Activity: Yes    Partners: Male    Birth Control/ Protection: Condom   Other Topics Concern  . Not on file   Social History Narrative   Regular exercise. No regular caffeine.     Objective: BP 126/79  Pulse 116  Temp(Src) 98.2 F (36.8 C) (Oral)  Ht 5' 4.6" (1.641 m)  Wt 189 lb (85.73 kg)  BMI 31.84 kg/m2  General: Alert and Oriented, No Acute Distress HEENT: Pupils equal, round, reactive to light. Conjunctivae clear.  External ears unremarkable, canals clear with intact TMs with appropriate landmarks.  Middle ear appears  open without effusion. Pink inferior turbinates.  Moist mucous membranes, pharynx without inflammation nor lesions however moderate cobblestoning and postnasal drip.  Neck supple without palpable lymphadenopathy nor abnormal masses. Lungs: Clear to auscultation bilaterally, no wheezing/ronchi/rales.  Comfortable work of breathing. Good air movement. Extremities: No peripheral edema.  Strong peripheral pulses.  Mental Status: No depression, anxiety, nor agitation. Skin: Warm and dry.  Assessment & Plan: Henry was seen today for dizziness, headache, cough and nasal congestion.  Diagnoses and associated orders for this visit:  Bacterial sinusitis - amoxicillin-clavulanate (AUGMENTIN) 500-125 MG per tablet; Take one by mouth every 8 hours for ten total days.    Bacterial sinusitis: Start Augmentin with Alka-Seltzer cold and sinus continue nasal saline washes.  Call me on Sunday if no better next step would be prednisone   Return if symptoms worsen or fail to improve.

## 2014-03-07 ENCOUNTER — Telehealth: Payer: Self-pay | Admitting: Family Medicine

## 2014-03-07 MED ORDER — PREDNISONE 20 MG PO TABS: ORAL_TABLET | ORAL | Status: AC

## 2014-03-07 NOTE — Telephone Encounter (Signed)
Sinus pressure no better, sending prednisone

## 2014-03-16 ENCOUNTER — Other Ambulatory Visit: Payer: Self-pay | Admitting: Sports Medicine

## 2014-03-16 DIAGNOSIS — J0100 Acute maxillary sinusitis, unspecified: Secondary | ICD-10-CM

## 2014-03-16 DIAGNOSIS — J019 Acute sinusitis, unspecified: Secondary | ICD-10-CM | POA: Insufficient documentation

## 2014-03-16 MED ORDER — AZITHROMYCIN 250 MG PO TABS: ORAL_TABLET | ORAL | Status: DC

## 2014-03-16 MED ORDER — FLUTICASONE PROPIONATE 50 MCG/ACT NA SUSP: NASAL | Status: DC

## 2014-03-16 MED ORDER — PHENYLEPHRINE-ACETAMINOPHEN 5-325 MG PO TABS: ORAL_TABLET | ORAL | Status: DC

## 2014-03-16 NOTE — Assessment & Plan Note (Signed)
Improved with Augmentin but still with vestibular symptoms. Also with an element of eustachian tube dysfunction. Flonase, azithromycin, over-the-counter decongestants such as Tylenol Sinus with phenylephrine. Return as needed.

## 2014-05-16 ENCOUNTER — Telehealth: Payer: Self-pay | Admitting: *Deleted

## 2014-05-16 DIAGNOSIS — D500 Iron deficiency anemia secondary to blood loss (chronic): Secondary | ICD-10-CM

## 2014-05-16 MED ORDER — FUSION PLUS PO CAPS: 1.0000 | ORAL_CAPSULE | Freq: Two times a day (BID) | ORAL | Status: DC

## 2014-05-16 NOTE — Telephone Encounter (Signed)
Pt asked me to check her hemoglobin due to feeling really sluggish and because she's having an abnormally heavy period.  The result was 9.8.  She wants to know if you could send her over an rx for iron.

## 2014-05-16 NOTE — Telephone Encounter (Signed)
Will start iron supplement and do baseline labs.  Can repeat la sin 6 weeks.

## 2014-07-14 ENCOUNTER — Ambulatory Visit (INDEPENDENT_AMBULATORY_CARE_PROVIDER_SITE_OTHER): Payer: 59 | Admitting: Obstetrics & Gynecology

## 2014-07-14 ENCOUNTER — Encounter: Payer: Self-pay | Admitting: Obstetrics & Gynecology

## 2014-07-14 VITALS — BP 111/66 | HR 89 | Resp 16 | Ht 64.0 in | Wt 198.0 lb

## 2014-07-14 DIAGNOSIS — Z124 Encounter for screening for malignant neoplasm of cervix: Secondary | ICD-10-CM

## 2014-07-14 DIAGNOSIS — Z1151 Encounter for screening for human papillomavirus (HPV): Secondary | ICD-10-CM

## 2014-07-14 DIAGNOSIS — Z01419 Encounter for gynecological examination (general) (routine) without abnormal findings: Secondary | ICD-10-CM

## 2014-07-14 MED ORDER — NORGESTIMATE-ETH ESTRADIOL 0.25-35 MG-MCG PO TABS: 1.0000 | ORAL_TABLET | Freq: Every day | ORAL | Status: DC

## 2014-07-14 NOTE — Patient Instructions (Signed)

## 2014-07-14 NOTE — Addendum Note (Signed)
Addended by: Asencion Islam on: 07/14/2014 03:51 PM   Modules accepted: Orders

## 2014-07-14 NOTE — Progress Notes (Deleted)
  Subjective:     Elizabeth Beasley is a 33 y.o. female and is here for a comprehensive physical exam. The patient reports {problems:16946}.  History   Social History  . Marital Status: Married    Spouse Name: Montine Circle    Number of Children: 2  . Years of Education: Assoc   Occupational History  . CMA    Social History Main Topics  . Smoking status: Never Smoker   . Smokeless tobacco: Never Used  . Alcohol Use: No  . Drug Use: No  . Sexual Activity: Yes    Partners: Male    Birth Control/ Protection: Condom   Other Topics Concern  . Not on file   Social History Narrative   Regular exercise. No regular caffeine.   Health Maintenance  Topic Date Due  . Pap Smear  08/06/2013  . Influenza Vaccine  04/16/2014  . Tetanus/tdap  09/17/2015    {Common ambulatory SmartLinks:19316}  Review of Systems {ros; complete:30496}   Objective:    {Exam, [phone]}    Assessment:    Healthy female exam. ***     Plan:     See After Visit Summary for Counseling Recommendations

## 2014-07-14 NOTE — Progress Notes (Signed)
  Subjective:     Elizabeth Beasley is a 33 y.o. female here for a routine exam.  Current complaints: none.  Personal health questionnaire reviewed: yes.   Gynecologic History Patient's last menstrual period was 07/07/2014. Contraception: condoms Last Pap: 2011. Results were: normal   Obstetric History OB History  Gravida Para Term Preterm AB SAB TAB Ectopic Multiple Living  2 2 2  0 0 0 0 0 0 2    # Outcome Date GA Lbr Len/2nd Weight Sex Delivery Anes PTL Lv  2 TRM 02/26/11 [redacted]w[redacted]d 16:00 6 lb 10 oz (3.005 kg) F SVD EPI N Y  1 TRM 10/17/08 [redacted]w[redacted]d  6 lb 2.4 oz (2.79 kg) F CS  N Y       The following portions of the patient's history were reviewed and updated as appropriate: allergies, current medications, past family history, past medical history, past social history, past surgical history and problem list.  Review of Systems Pertinent items are noted in HPI.    Objective:   Filed Vitals:   07/14/14 1505  BP: 111/66  Pulse: 89  Resp: 16  Height: 5\' 4"  (1.626 m)  Weight: 198 lb (89.812 kg)   Vitals:  WNL General appearance: alert, cooperative and no distress Head: Normocephalic, without obvious abnormality, atraumatic Eyes: negative Throat: lips, mucosa, and tongue normal; teeth and gums normal Lungs: clear to auscultation bilaterally Breasts: normal appearance, no masses or tenderness, No nipple retraction or dimpling, No nipple discharge or bleeding Heart: regular rate and rhythm Abdomen: soft, non-tender; bowel sounds normal; no masses,  no organomegaly  Pelvic:  External Genitalia:  Tanner V, no lesion Urethra:  Large diverticulum near meatus Vagina:  Pink, normal rugae, no blood or discharge Cervix:  No CMT, no lesion Uterus:  Normal size and contour, non tender Adnexa:  Normal, no masses, non tender  Extremities: no edema, redness or tenderness in the calves or thighs Skin: no lesions or rash Lymph nodes: Axillary adenopathy: none     Assessment:    Healthy female exam.  Urethral diverticulum   Plan:    Contraception: OCP (estrogen/progesterone). Hormone replacement therapy: not applicable. Follow up in: 1 year. Sunscreen advised Orthocyclen  (condoms x2 weeks since starting mid cycle.

## 2014-07-18 ENCOUNTER — Encounter: Payer: Self-pay | Admitting: Obstetrics & Gynecology

## 2014-07-18 LAB — CYTOLOGY - PAP

## 2015-05-07 ENCOUNTER — Other Ambulatory Visit: Payer: Self-pay | Admitting: Obstetrics & Gynecology

## 2015-05-07 MED ORDER — NORETHIN ACE-ETH ESTRAD-FE 1-20 MG-MCG(24) PO TABS: 1.0000 | ORAL_TABLET | Freq: Every day | ORAL | Status: DC

## 2015-05-10 ENCOUNTER — Other Ambulatory Visit: Payer: Self-pay | Admitting: *Deleted

## 2015-05-10 DIAGNOSIS — Z0000 Encounter for general adult medical examination without abnormal findings: Secondary | ICD-10-CM

## 2015-05-17 ENCOUNTER — Encounter: Payer: 59 | Admitting: Physician Assistant

## 2015-05-25 ENCOUNTER — Ambulatory Visit (INDEPENDENT_AMBULATORY_CARE_PROVIDER_SITE_OTHER): Payer: 59 | Admitting: Family Medicine

## 2015-05-25 ENCOUNTER — Encounter: Payer: Self-pay | Admitting: Family Medicine

## 2015-05-25 VITALS — BP 111/70 | HR 86 | Ht 64.5 in | Wt 199.0 lb

## 2015-05-25 DIAGNOSIS — R635 Abnormal weight gain: Secondary | ICD-10-CM

## 2015-05-25 DIAGNOSIS — Z6833 Body mass index (BMI) 33.0-33.9, adult: Secondary | ICD-10-CM

## 2015-05-25 MED ORDER — PHENTERMINE HCL 37.5 MG PO TABS: 37.5000 mg | ORAL_TABLET | Freq: Every day | ORAL | Status: DC

## 2015-05-25 NOTE — Progress Notes (Signed)
   Subjective:    Patient ID: Elizabeth Beasley, female    DOB: 01-14-1981, 34 y.o.   MRN: [id number]  HPI She is doing the couch to 5K. Has been working out  3-5 days.  Averaging 3 days per week most weeks. Has been walking some at lunch. She has taken phentermine before and did well with it and is interested in getting back on it. There she wants to start with a half a tab first just to make sure it's not going to make her irritable since she started a new birth control pill recently.. She has tried using a weight loss, on and off. She does currently does better when she is using it regularly. She had previously been using my fitness pal. She denies any previous heart conditions. No chest pain, palpitations or recent shortness of breath episodes.  Goal weight is 170-180.  She is happy with the goal. She just wants to be healthy overall and wants to reduce her risk of future complications such as blood pressure and glucose issues.   Review of Systems     Objective:   Physical Exam  Constitutional: She is oriented to person, place, and time. She appears well-developed and well-nourished.  HENT:  Head: Normocephalic and atraumatic.  Cardiovascular: Normal rate, regular rhythm and normal heart sounds.   Pulmonary/Chest: Effort normal and breath sounds normal.  Neurological: She is alert and oriented to person, place, and time.  Skin: Skin is warm and dry.  Psychiatric: She has a normal mood and affect. Her behavior is normal.          Assessment & Plan:  Abnormal weight gain/BMI 33-discussed options. Encouraged her to think about nutrition counseling. She says she's interested but wants to hold off for now. We'll go ahead and restart phentermine which she has taken before without any side effects or problems. She will need to follow-up in one month for blood pressure and weight check. Stop immediately if any chest pain or discomfort. Encouraged her to get back on the smart phone  application called my fitness pal. She will need to reset her data. Also encouraged her not to skip breakfast which she often does. Encouraged her to get a little more protein in her diet which will help her feel more full as well, especially since she is working out. Did encourage her to also try to increase her exercise to 5 days per week to really promote increased metabolism in activity.

## 2015-06-30 ENCOUNTER — Ambulatory Visit (INDEPENDENT_AMBULATORY_CARE_PROVIDER_SITE_OTHER): Payer: 59 | Admitting: Osteopathic Medicine

## 2015-06-30 VITALS — BP 113/61 | HR 84 | Ht 64.0 in | Wt 195.0 lb

## 2015-06-30 DIAGNOSIS — Z79899 Other long term (current) drug therapy: Secondary | ICD-10-CM

## 2015-06-30 MED ORDER — PHENTERMINE HCL 37.5 MG PO TABS: 37.5000 mg | ORAL_TABLET | Freq: Every day | ORAL | Status: DC

## 2015-06-30 NOTE — Progress Notes (Signed)
Patient was in office for weight check. Patient denied anything abnormal.Pt has has not taken the pills everyday and has been taking a half tablet. Has continued her current exercise regimen. Angella Montas,CMA

## 2015-07-03 ENCOUNTER — Ambulatory Visit (INDEPENDENT_AMBULATORY_CARE_PROVIDER_SITE_OTHER): Payer: 59 | Admitting: Family Medicine

## 2015-07-03 DIAGNOSIS — Z23 Encounter for immunization: Secondary | ICD-10-CM

## 2015-07-03 DIAGNOSIS — Z111 Encounter for screening for respiratory tuberculosis: Secondary | ICD-10-CM

## 2015-07-03 NOTE — Progress Notes (Signed)
   Subjective:    Patient ID: Elizabeth Beasley, female    DOB: 11-24-1980, 34 y.o.   MRN: [id number]  HPI  [name] is here today for T-dap and PPD placement.   Review of Systems     Objective:   Physical Exam        Assessment & Plan:  Patient tolerated injection well without complications.

## 2015-07-03 NOTE — Progress Notes (Signed)
   Subjective:    Patient ID: Elizabeth Beasley, female    DOB: Aug 04, 1981, 34 y.o.   MRN: [id number]  HPI    Review of Systems     Objective:   Physical Exam        Assessment & Plan:  Agree with below.   Beatrice Lecher, MD

## 2015-07-04 ENCOUNTER — Encounter: Payer: Self-pay | Admitting: Obstetrics & Gynecology

## 2015-07-04 ENCOUNTER — Ambulatory Visit (INDEPENDENT_AMBULATORY_CARE_PROVIDER_SITE_OTHER): Payer: 59 | Admitting: Obstetrics & Gynecology

## 2015-07-04 VITALS — BP 135/82 | HR 98 | Resp 16 | Ht 64.0 in | Wt 198.0 lb

## 2015-07-04 DIAGNOSIS — Z3041 Encounter for surveillance of contraceptive pills: Secondary | ICD-10-CM | POA: Diagnosis not present

## 2015-07-04 MED ORDER — NORETHIN ACE-ETH ESTRAD-FE 1-20 MG-MCG(24) PO TABS: 1.0000 | ORAL_TABLET | Freq: Every day | ORAL | Status: DC

## 2015-07-04 NOTE — Progress Notes (Signed)
   Subjective:    Patient ID: Elizabeth Beasley, female    DOB: November 03, 1980, 34 y.o.   MRN: [id number]  HPI  Patient is a 34 year old G2 para 39 female who presents for oral contraception surveillance. She is very happy with Loestrin E 24. She only bleeds during the placebo pills. She has not gotten her urethral diverticulum fixed, but is planning on getting a surgical correction Patient has no complaints. She does not have frequency urgency or discharge from her urethra. Review of Systems  Constitutional: Negative.   Gastrointestinal: Negative.   Genitourinary: Negative.        Objective:   Physical Exam  Constitutional: She is oriented to person, place, and time. She appears well-developed and well-nourished. No distress.  HENT:  Head: Normocephalic and atraumatic.  Eyes: Conjunctivae are normal.  Pulmonary/Chest: Effort normal.  Abdominal: Soft.  Musculoskeletal: She exhibits no edema.  Neurological: She is alert and oriented to person, place, and time.  Skin: Skin is warm and dry.  Psychiatric: She has a normal mood and affect.  Vitals reviewed.   Filed Vitals:   07/04/15 1535  BP: 135/82  Pulse: 98  Resp: 16  Height: 5\' 4"  (1.626 m)  Weight: 198 lb (89.812 kg)        Assessment & Plan:  34 year old female for oral contraceptive surveillance  Loestrin Fe 1/20 for prescribed for 12 months. Pap smear up-to-date family history negative for cancers Return to clinic in 1 year.

## 2015-07-05 LAB — TB SKIN TEST
Induration: 0 mm
TB Skin Test: NEGATIVE

## 2015-11-22 ENCOUNTER — Encounter: Payer: Self-pay | Admitting: Osteopathic Medicine

## 2015-11-22 ENCOUNTER — Ambulatory Visit (INDEPENDENT_AMBULATORY_CARE_PROVIDER_SITE_OTHER): Payer: 59 | Admitting: Osteopathic Medicine

## 2015-11-22 VITALS — BP 111/48 | HR 66 | Wt 194.0 lb

## 2015-11-22 DIAGNOSIS — M542 Cervicalgia: Secondary | ICD-10-CM | POA: Diagnosis not present

## 2015-11-22 DIAGNOSIS — M9901 Segmental and somatic dysfunction of cervical region: Secondary | ICD-10-CM

## 2015-11-22 DIAGNOSIS — M9902 Segmental and somatic dysfunction of thoracic region: Secondary | ICD-10-CM

## 2015-11-22 NOTE — Progress Notes (Signed)
HPI: Elizabeth Beasley is a 35 y.o. female who presents to [place] today for chief complaint of:  Chief Complaint  Patient presents with  . Back Pain    request OMM treatment     . Location: upper back and neck, worse on L . Quality: soreness, tightness . Duration: few days . Context:thinks may have pulled something while doing an aerobic exercise video, admits not consistent with warmup/cooldowns and stretching when exercising. Modifying factors: Ibuprofen minimally helpful   Past medical, social and family history reviewed: Past Medical History  Diagnosis Date  . Urethral diverticulum    Past Surgical History  Procedure Laterality Date  . Cesarean section  2/10    Breech   Social History  Substance Use Topics  . Smoking status: Never Smoker   . Smokeless tobacco: Never Used  . Alcohol Use: No   Family History  Problem Relation Age of Onset  . Colon cancer Father   . Colon polyps Mother   . Hypertension Mother   . Diabetes Paternal Grandmother   . Stroke Paternal Grandmother   . Alcohol abuse Paternal Uncle   . Colon cancer Paternal Aunt     Current Outpatient Prescriptions  Medication Sig Dispense Refill  . Norethindrone Acetate-Ethinyl Estrad-FE (LOESTRIN 24 FE) 1-20 MG-MCG(24) tablet Take 1 tablet by mouth daily. 1 Package 11   No current facility-administered medications for this visit.   No Known Allergies    Review of Systems: HEAD/EYES/EARS/NOSE/THROAT: No  headache, no vision change, no hearing change,  CARDIAC: No  chest pain MUSCULOSKELETAL: (+) myalgia/arthralgia as per HPI NEUROLOGIC: No  weakness, No  dizziness  Exam:  BP 111/48 mmHg  Pulse 66  Wt 194 lb (87.998 kg) Constitutional: VS see above. General Appearance: alert, well-developed, well-nourished, NAD Musculoskeletal: Gait normal. No clubbing/cyanosis of digits. (+) paraspinal tenderness C7-T3 on L, worse in felxion and extension Neurological: No  cranial nerve deficit on limited exam Skin: warm, dry, intact.  Psychiatric: Normal judgment/insight. Normal mood and affect.    ASSESSMENT/PLAN: Procedure - OMT procedures applied to cervical and thoracic regions, ME and MFR and FPR to (+) patient relief  Neck pain  Somatic dysfunction of cervical region  Somatic dysfunction of thoracic region   Total time spent 30 minutes, greater than 50% of the visit was counseling and coordinating care for diagnosis of neck pain.   No Follow-up on file.

## 2015-12-27 ENCOUNTER — Other Ambulatory Visit: Payer: Self-pay | Admitting: Family Medicine

## 2015-12-27 MED ORDER — NORETHIN ACE-ETH ESTRAD-FE 1-20 MG-MCG(24) PO TABS: 1.0000 | ORAL_TABLET | Freq: Every day | ORAL | Status: DC

## 2015-12-27 MED FILL — LARIN 24 FE 1 MG-20 MCG TAB: 1-20 | 84 days supply | Qty: 84 | Fill #0

## 2015-12-27 NOTE — Progress Notes (Signed)
Refill of LOESTRIN 24 FE sent to pt pharmacy. Ok per Dr. Madilyn Fireman.

## 2016-02-29 ENCOUNTER — Encounter: Payer: Self-pay | Admitting: Family Medicine

## 2016-02-29 ENCOUNTER — Ambulatory Visit (INDEPENDENT_AMBULATORY_CARE_PROVIDER_SITE_OTHER): Payer: 59 | Admitting: Family Medicine

## 2016-02-29 VITALS — BP 114/58 | HR 85 | Wt 197.0 lb

## 2016-02-29 DIAGNOSIS — Z841 Family history of disorders of kidney and ureter: Secondary | ICD-10-CM

## 2016-02-29 NOTE — Progress Notes (Signed)
Subjective:    CC: Questions about being a living kidney donor.   HPI:  Patient comes in to discuss possibly being a living kidney donor for a relative. It is her father's cousin. He is in his 20s and unfortunately secondary to diabetes is now the point of needing a kidney transplant. She is not sure of her own blood type or his blood type. But she just had some questions. She does report a family history of diabetes in second-degree relatives and high blood pressure in first-degree relatives. She herself does not have any of these medical conditions. She has no prior history of renal or kidney problems that she does have a urethral diverticulum.   Past medical history, Surgical history, Family history not pertinant except as noted below, Social history, Allergies, and medications have been entered into the medical record, reviewed, and corrections made.   Review of Systems: No fevers, chills, night sweats, weight loss, chest pain, or shortness of breath.   Objective:    General: Well Developed, well nourished, and in no acute distress.  Neuro: Alert and oriented x3, extra-ocular muscles intact, sensation grossly intact.  HEENT: Normocephalic, atraumatic  Skin: Warm and dry, no rashes. Cardiac: Regular rate and rhythm, no murmurs rubs or gallops, no lower extremity edema.  Respiratory: Clear to auscultation bilaterally. Not using accessory muscles, speaking in full sentences.   Impression and Recommendations:   Family history kidney disease-consultation. Additional information provided from up-to-date with articles about the process for donation as well as the potential risks.  She plans to check with her OB about blood typing.     58min spent, > 50% spent counseling about possibility of donation and potential risk.

## 2016-03-11 ENCOUNTER — Ambulatory Visit (INDEPENDENT_AMBULATORY_CARE_PROVIDER_SITE_OTHER): Payer: 59 | Admitting: Sports Medicine

## 2016-03-11 DIAGNOSIS — M609 Myositis, unspecified: Secondary | ICD-10-CM | POA: Diagnosis not present

## 2016-03-11 DIAGNOSIS — M62838 Other muscle spasm: Secondary | ICD-10-CM | POA: Insufficient documentation

## 2016-03-11 DIAGNOSIS — M6249 Contracture of muscle, multiple sites: Secondary | ICD-10-CM | POA: Diagnosis not present

## 2016-03-11 DIAGNOSIS — M791 Myalgia: Secondary | ICD-10-CM

## 2016-03-11 DIAGNOSIS — IMO0001 Reserved for inherently not codable concepts without codable children: Secondary | ICD-10-CM

## 2016-03-11 MED ORDER — MELOXICAM 15 MG PO TABS: ORAL_TABLET | ORAL | Status: DC

## 2016-03-11 MED ORDER — CYCLOBENZAPRINE HCL 10 MG PO TABS: ORAL_TABLET | ORAL | Status: DC

## 2016-03-11 MED FILL — CYCLOBENZAPRINE 10 MG TAB: 10 | 10 days supply | Qty: 30 | Fill #0

## 2016-03-11 MED FILL — MELOXICAM 15 MG TABLET: 15 | 30 days supply | Qty: 30 | Fill #0

## 2016-03-11 NOTE — Assessment & Plan Note (Signed)
Trigger point injection 3 into the left trapezius, Toradol 30, meloxicam, Flexeril at bedtime, rehabilitation exercises given. Return in one month as needed.

## 2016-03-11 NOTE — Progress Notes (Signed)
   Subjective:    I'm seeing this patient as a consultation for:  Dr. Beatrice Lecher  CC: neck pain  HPI: For the past couple of days this pleasant 34 year old female has had increasing pain that she localizes along her left cervical paraspinal muscles as well as left trapezius, loss of range of motion, no radicular symptoms, no constitutional symptoms, no trauma.  Past medical history, Surgical history, Family history not pertinant except as noted below, Social history, Allergies, and medications have been entered into the medical record, reviewed, and no changes needed.   Review of Systems: No headache, visual changes, nausea, vomiting, diarrhea, constipation, dizziness, abdominal pain, skin rash, fevers, chills, night sweats, weight loss, swollen lymph nodes, body aches, joint swelling, muscle aches, chest pain, shortness of breath, mood changes, visual or auditory hallucinations.   Objective:   General: Well Developed, well nourished, and in no acute distress.  Neuro/Psych: Alert and oriented x3, extra-ocular muscles intact, able to move all 4 extremities, sensation grossly intact. Skin: Warm and dry, no rashes noted.  Respiratory: Not using accessory muscles, speaking in full sentences, trachea midline.  Cardiovascular: Pulses palpable, no extremity edema. Abdomen: Does not appear distended. Neck: Negative spurling's Full neck range of motion Grip strength and sensation normal in bilateral hands Strength good C4 to T1 distribution No sensory change to C4 to T1 Reflexes normal Tender to palpation along the left trapezius in multiple locations.  Procedure:  Injection of #3 left trapezial trigger points Consent obtained and verified. Time-out conducted. Noted no overlying erythema, induration, or other signs of local infection. Skin prepped in a sterile fashion. Topical analgesic spray: Ethyl chloride. Completed without difficulty. Meds:a total of 1 mL kenalog 40, 5 mL  lidocaine spray not between the 3 trigger points. Pain immediately improved suggesting accurate placement of the medication. Advised to call if fevers/chills, erythema, induration, drainage, or persistent bleeding.  Impression and Recommendations:   This case required medical decision making of moderate complexity.

## 2016-03-29 ENCOUNTER — Other Ambulatory Visit: Payer: Self-pay | Admitting: *Deleted

## 2016-03-29 MED FILL — LARIN 24 FE 1 MG-20 MCG TAB: 1-20 | 84 days supply | Qty: 84 | Fill #1

## 2016-04-08 ENCOUNTER — Other Ambulatory Visit: Payer: Self-pay | Admitting: Physician Assistant

## 2016-04-08 MED ORDER — HYDROCORTISONE 2.5 % RE CREA: 1.0000 "application " | TOPICAL_CREAM | Freq: Two times a day (BID) | RECTAL | 2 refills | Status: DC

## 2016-04-08 MED ORDER — HYDROCORTISONE ACE-PRAMOXINE 1-1 % RE FOAM: 1.0000 | Freq: Two times a day (BID) | RECTAL | 1 refills | Status: DC

## 2016-04-08 MED FILL — PROCTOFOAM-HC 1%-1% FOAM: 1-1 | 10 days supply | Qty: 10 | Fill #0

## 2016-04-08 MED FILL — PROCTOSOL-HC 2.5% CREAM: 2.5 | 28 days supply | Qty: 57 | Fill #0

## 2016-04-25 ENCOUNTER — Encounter: Payer: Self-pay | Admitting: Physician Assistant

## 2016-04-25 ENCOUNTER — Ambulatory Visit (INDEPENDENT_AMBULATORY_CARE_PROVIDER_SITE_OTHER): Payer: 59 | Admitting: Physician Assistant

## 2016-04-25 VITALS — BP 110/68 | HR 61 | Ht 64.0 in | Wt 194.0 lb

## 2016-04-25 DIAGNOSIS — E669 Obesity, unspecified: Secondary | ICD-10-CM | POA: Diagnosis not present

## 2016-04-25 DIAGNOSIS — R635 Abnormal weight gain: Secondary | ICD-10-CM | POA: Diagnosis not present

## 2016-04-25 MED ORDER — PHENTERMINE HCL 37.5 MG PO TABS: 37.5000 mg | ORAL_TABLET | Freq: Every day | ORAL | 0 refills | Status: DC

## 2016-04-25 MED FILL — PHENTERMINE 37.5 MG TABLET: 37.5 | 60 days supply | Qty: 60 | Fill #0

## 2016-04-25 NOTE — Progress Notes (Signed)
   Subjective:    Patient ID: Elizabeth Beasley, female    DOB: [birth year], 35 y.o.   MRN: [id number]  HPI A patient is a 35 year old female who presents to the clinic to discuss weight loss. She is going on a cruise in a few weeks so like to lose weight. She has tried phentermine in the past and has worked well. She has not had any side effects to medication. I one point she was down to 177 with phentermine. She is exercising twice a week. She admits that her diet is not wearing should be but she is drinking lots of water.   Review of Systems  All other systems reviewed and are negative.      Objective:   Physical Exam  Constitutional: She is oriented to person, place, and time. She appears well-developed and well-nourished.  HENT:  Head: Normocephalic and atraumatic.  Cardiovascular: Normal rate, regular rhythm and normal heart sounds.   Pulmonary/Chest: Effort normal and breath sounds normal.  Neurological: She is alert and oriented to person, place, and time.  Skin: Skin is dry.  Psychiatric: She has a normal mood and affect. Her behavior is normal.          Assessment & Plan:  Obesity/abnormal weight gain-BMI 33. Will restart phentermine. Discussed side effects. Patient will follow-up in one to 2 months with a nurse visit for vital recheck. Discussed long-term options for obesity. Patient will consider saxenda in the future. Discussed potential side effects of medication. Follow-up as needed.

## 2016-06-13 ENCOUNTER — Ambulatory Visit (INDEPENDENT_AMBULATORY_CARE_PROVIDER_SITE_OTHER): Payer: 59 | Admitting: Family Medicine

## 2016-06-13 VITALS — BP 115/66 | HR 87

## 2016-06-13 DIAGNOSIS — G43809 Other migraine, not intractable, without status migrainosus: Secondary | ICD-10-CM

## 2016-06-13 MED ORDER — KETOROLAC TROMETHAMINE 60 MG/2ML IM SOLN
60.0000 mg | Freq: Once | INTRAMUSCULAR | Status: AC
  Administered 2016-06-13: 60 mg via INTRAMUSCULAR

## 2016-06-13 NOTE — Progress Notes (Signed)
   Subjective:    Patient ID: Elizabeth Beasley, female    DOB: [birth year], 35 y.o.   MRN: [id number]  HPI Migraine HA x 2-3 days. Just not going away.  Home meds not working well.  Has been nauseated as well.  Would like toradol  Injection for pain.    Review of Systems     Objective:   Physical Exam        Assessment & Plan:  Migraine HA - given toradol injection IM.  Pt tolerated well.  Beatrice Lecher, MD

## 2016-06-13 NOTE — Progress Notes (Signed)
Pt given 60mg  Toradol RUOQ.  No complications.

## 2016-06-28 ENCOUNTER — Telehealth: Payer: Self-pay | Admitting: *Deleted

## 2016-06-28 DIAGNOSIS — N361 Urethral diverticulum: Secondary | ICD-10-CM

## 2016-06-28 NOTE — Telephone Encounter (Signed)
Pt has an upcoming appt with her urologist and would like to get an MRI done prior to her appt so that they can have the results.Elizabeth Beasley

## 2016-07-05 MED FILL — LARIN 24 FE 1 MG-20 MCG TAB: 1-20 | 84 days supply | Qty: 84 | Fill #2

## 2016-07-08 ENCOUNTER — Ambulatory Visit (INDEPENDENT_AMBULATORY_CARE_PROVIDER_SITE_OTHER): Payer: 59

## 2016-07-08 DIAGNOSIS — Z0389 Encounter for observation for other suspected diseases and conditions ruled out: Secondary | ICD-10-CM | POA: Diagnosis not present

## 2016-07-08 DIAGNOSIS — R935 Abnormal findings on diagnostic imaging of other abdominal regions, including retroperitoneum: Secondary | ICD-10-CM

## 2016-07-08 DIAGNOSIS — N361 Urethral diverticulum: Secondary | ICD-10-CM

## 2016-07-08 MED ORDER — GADOBENATE DIMEGLUMINE 529 MG/ML IV SOLN
20.0000 mL | Freq: Once | INTRAVENOUS | Status: AC | PRN
  Administered 2016-07-08: 17 mL via INTRAVENOUS

## 2016-07-10 ENCOUNTER — Ambulatory Visit (INDEPENDENT_AMBULATORY_CARE_PROVIDER_SITE_OTHER): Payer: 59 | Admitting: Family Medicine

## 2016-07-10 VITALS — Temp 99.5°F

## 2016-07-10 DIAGNOSIS — Z23 Encounter for immunization: Secondary | ICD-10-CM | POA: Diagnosis not present

## 2016-07-10 NOTE — Progress Notes (Signed)
Pt here for flu immunization. Immunization given in R deltoid. Pt tolerated well.Elizabeth Beasley, [name]

## 2016-08-07 ENCOUNTER — Encounter: Payer: Self-pay | Admitting: Physician Assistant

## 2016-08-07 ENCOUNTER — Ambulatory Visit (INDEPENDENT_AMBULATORY_CARE_PROVIDER_SITE_OTHER): Payer: 59 | Admitting: Physician Assistant

## 2016-08-07 VITALS — BP 116/68 | HR 70 | Ht 64.0 in | Wt 195.0 lb

## 2016-08-07 DIAGNOSIS — J0100 Acute maxillary sinusitis, unspecified: Secondary | ICD-10-CM

## 2016-08-07 MED ORDER — AMOXICILLIN 875 MG PO TABS: 875.0000 mg | ORAL_TABLET | Freq: Two times a day (BID) | ORAL | 0 refills | Status: DC

## 2016-08-10 ENCOUNTER — Encounter: Payer: Self-pay | Admitting: Physician Assistant

## 2016-08-10 NOTE — Progress Notes (Signed)
   Subjective:    Patient ID: Elizabeth Beasley, female    DOB: 08-Sep-1981, 35 y.o.   MRN: [id number]  HPI Pt is a 35 female who presents to the clinic with a 2-3 week history of sinus pressure, congestion, ear pressure, ST, dry cough. She felt like she was getting better about 4 days ago with OTC treatment and then got worse again. She is taking mucinex, sudafed, cough drops, tylenol with little relief. She would like to feel better for thanksgiving holiday.    Review of Systems  All other systems reviewed and are negative.      Objective:   Physical Exam  Constitutional: She is oriented to person, place, and time. She appears well-developed and well-nourished.  HENT:  Head: Normocephalic and atraumatic.  Right Ear: External ear normal.  Left Ear: External ear normal.  TM's erythematous with good light reflex.  Oropharynx erythematous without any tonsilar swelling or exudate.  Nasal turbinates red and swollen.  Tenderness over maxillary sinuses to palpation.   Eyes: Conjunctivae are normal. Right eye exhibits no discharge. Left eye exhibits no discharge.  Cardiovascular: Normal rate, regular rhythm and normal heart sounds.   Pulmonary/Chest: Effort normal and breath sounds normal. She has no wheezes.  Lymphadenopathy:    She has cervical adenopathy.  Neurological: She is alert and oriented to person, place, and time.  Psychiatric: She has a normal mood and affect. Her behavior is normal.          Assessment & Plan:  Marland KitchenMarland KitchenDiagnoses and all orders for this visit:  Acute maxillary sinusitis, recurrence not specified -     amoxicillin (AMOXIL) 875 MG tablet; Take 1 tablet (875 mg total) by mouth 2 (two) times daily.   Due to duration treated with abx. Discussed symptomatic treatment. Follow up in not improving.

## 2016-08-20 DIAGNOSIS — N361 Urethral diverticulum: Secondary | ICD-10-CM | POA: Diagnosis not present

## 2016-08-21 ENCOUNTER — Other Ambulatory Visit: Payer: Self-pay | Admitting: Urology

## 2016-08-21 ENCOUNTER — Encounter: Payer: Self-pay | Admitting: Family Medicine

## 2016-08-21 ENCOUNTER — Ambulatory Visit (INDEPENDENT_AMBULATORY_CARE_PROVIDER_SITE_OTHER): Payer: 59 | Admitting: Family Medicine

## 2016-08-21 VITALS — BP 117/66 | HR 64 | Ht 64.0 in | Wt 194.0 lb

## 2016-08-21 DIAGNOSIS — N920 Excessive and frequent menstruation with regular cycle: Secondary | ICD-10-CM

## 2016-08-21 MED ORDER — DESOGESTREL-ETHINYL ESTRADIOL 0.15-0.02/0.01 MG (21/5) PO TABS: 1.0000 | ORAL_TABLET | Freq: Every day | ORAL | 11 refills | Status: DC

## 2016-08-21 MED ORDER — NORELGESTROMIN-ETH ESTRADIOL 150-35 MCG/24HR TD PTWK: 1.0000 | MEDICATED_PATCH | TRANSDERMAL | 12 refills | Status: DC

## 2016-08-21 NOTE — Progress Notes (Signed)
   Subjective:    Patient ID: Elizabeth Beasley, female    DOB: 1981-06-03, 35 y.o.   MRN: [id number]  HPI 35 yo female on OCPs who comes in with period that has lasted 2 weeks. She just stared on antibiotics on 08/07/16. Her period are regular. Usually last 3 days but this time has continue. Was regular flow until Sat.  O/W no major changes in routine.  Even today she is having some pelvic cramping. She is sexually active with her husband. No other pelvic sxs. Has been fatigued as well.    Review of Systems     Objective:   Physical Exam  Constitutional: She is oriented to person, place, and time. She appears well-developed and well-nourished.  HENT:  Head: Normocephalic and atraumatic.  Eyes: Conjunctivae and EOM are normal.  Cardiovascular: Normal rate.   Pulmonary/Chest: Effort normal.  Neurological: She is alert and oriented to person, place, and time.  Skin: Skin is dry. No pallor.  Psychiatric: She has a normal mood and affect. Her behavior is normal.  Vitals reviewed.      Assessment & Plan:  Menorrhagia - likely secondary to taking recent antibiotic.  Offered to change her OCP. Will change to Ortho Evra patch since she often misses a pill. Neg pregancy test today.  Hemoglobin is normal.

## 2016-08-22 ENCOUNTER — Other Ambulatory Visit: Payer: Self-pay | Admitting: *Deleted

## 2016-08-22 MED ORDER — NORELGESTROMIN-ETH ESTRADIOL 150-35 MCG/24HR TD PTWK: 1.0000 | MEDICATED_PATCH | TRANSDERMAL | 12 refills | Status: DC

## 2016-08-28 ENCOUNTER — Encounter: Payer: Self-pay | Admitting: *Deleted

## 2016-08-29 ENCOUNTER — Encounter (HOSPITAL_BASED_OUTPATIENT_CLINIC_OR_DEPARTMENT_OTHER): Payer: Self-pay | Admitting: *Deleted

## 2016-08-29 ENCOUNTER — Other Ambulatory Visit: Payer: Self-pay | Admitting: *Deleted

## 2016-08-29 MED ORDER — NORELGESTROMIN-ETH ESTRADIOL 150-35 MCG/24HR TD PTWK: 1.0000 | MEDICATED_PATCH | TRANSDERMAL | 12 refills | Status: DC

## 2016-08-29 NOTE — Progress Notes (Signed)
NPO AFTER MN WITH EXCEPTION CLEAR LIQUIDS UNTIL 0630 (NO CREAM/ MILK PRODUCTS).  ARRIVE AT 1115.  NEEDS HG AND URINE PREG.

## 2016-08-30 ENCOUNTER — Encounter (HOSPITAL_BASED_OUTPATIENT_CLINIC_OR_DEPARTMENT_OTHER)
Admission: RE | Disposition: A | Discharge status: Home / Self Care | Payer: Self-pay | Source: Ambulatory Visit | Attending: Urology

## 2016-08-30 ENCOUNTER — Ambulatory Visit (HOSPITAL_BASED_OUTPATIENT_CLINIC_OR_DEPARTMENT_OTHER)
Admission: RE | Admit: 2016-08-30 | Discharge: 2016-08-30 | Disposition: A | Discharge status: Home / Self Care | Payer: 59 | Source: Ambulatory Visit | Attending: Urology | Admitting: Urology

## 2016-08-30 ENCOUNTER — Encounter (HOSPITAL_BASED_OUTPATIENT_CLINIC_OR_DEPARTMENT_OTHER): Payer: Self-pay

## 2016-08-30 ENCOUNTER — Ambulatory Visit (HOSPITAL_BASED_OUTPATIENT_CLINIC_OR_DEPARTMENT_OTHER): Payer: 59 | Admitting: Anesthesiology

## 2016-08-30 DIAGNOSIS — Z793 Long term (current) use of hormonal contraceptives: Secondary | ICD-10-CM | POA: Insufficient documentation

## 2016-08-30 DIAGNOSIS — N750 Cyst of Bartholin's gland: Secondary | ICD-10-CM | POA: Diagnosis not present

## 2016-08-30 DIAGNOSIS — N361 Urethral diverticulum: Secondary | ICD-10-CM

## 2016-08-30 DIAGNOSIS — N758 Other diseases of Bartholin's gland: Secondary | ICD-10-CM | POA: Diagnosis not present

## 2016-08-30 DIAGNOSIS — Z87891 Personal history of nicotine dependence: Secondary | ICD-10-CM | POA: Diagnosis not present

## 2016-08-30 DIAGNOSIS — M62838 Other muscle spasm: Secondary | ICD-10-CM | POA: Diagnosis not present

## 2016-08-30 HISTORY — DX: Cyst of Bartholin's gland: N75.0

## 2016-08-30 HISTORY — PX: URETHRAL CYST REMOVAL: SHX5128

## 2016-08-30 LAB — POCT HEMOGLOBIN-HEMACUE: HEMOGLOBIN: 13.9 g/dL (ref 12.0–15.0)

## 2016-08-30 LAB — POCT PREGNANCY, URINE: PREG TEST UR: NEGATIVE

## 2016-08-30 SURGERY — EXCISION, CYST, PERIURETHRAL
Anesthesia: General | Site: Vagina

## 2016-08-30 MED ORDER — ESTRADIOL 0.1 MG/GM VA CREA
TOPICAL_CREAM | VAGINAL | Status: DC | PRN
  Administered 2016-08-30: 1 via VAGINAL

## 2016-08-30 MED ORDER — OXYCODONE HCL 5 MG PO TABS
5.0000 mg | ORAL_TABLET | Freq: Once | ORAL | Status: DC | PRN
  Filled 2016-08-30: qty 1

## 2016-08-30 MED ORDER — FENTANYL CITRATE (PF) 100 MCG/2ML IJ SOLN
25.0000 ug | INTRAMUSCULAR | Status: DC | PRN
  Filled 2016-08-30: qty 1

## 2016-08-30 MED ORDER — MIDAZOLAM HCL 2 MG/2ML IJ SOLN
INTRAMUSCULAR | Status: AC
  Filled 2016-08-30: qty 2

## 2016-08-30 MED ORDER — TRAMADOL HCL 50 MG PO TABS: 50.0000 mg | ORAL_TABLET | Freq: Four times a day (QID) | ORAL | 0 refills | Status: DC | PRN

## 2016-08-30 MED ORDER — ONDANSETRON HCL 4 MG/2ML IJ SOLN
INTRAMUSCULAR | Status: AC
  Filled 2016-08-30: qty 2

## 2016-08-30 MED ORDER — KETOROLAC TROMETHAMINE 30 MG/ML IJ SOLN
30.0000 mg | Freq: Once | INTRAMUSCULAR | Status: DC | PRN
  Filled 2016-08-30: qty 1

## 2016-08-30 MED ORDER — ONDANSETRON HCL 4 MG/2ML IJ SOLN
INTRAMUSCULAR | Status: DC | PRN
  Administered 2016-08-30: 8 mg via INTRAVENOUS

## 2016-08-30 MED ORDER — LIDOCAINE HCL (CARDIAC) 20 MG/ML IV SOLN
INTRAVENOUS | Status: DC | PRN
  Administered 2016-08-30: 100 mg via INTRAVENOUS

## 2016-08-30 MED ORDER — DEXAMETHASONE SODIUM PHOSPHATE 4 MG/ML IJ SOLN
INTRAMUSCULAR | Status: DC | PRN
  Administered 2016-08-30: 10 mg via INTRAVENOUS

## 2016-08-30 MED ORDER — CEFAZOLIN SODIUM-DEXTROSE 2-3 GM-% IV SOLR
INTRAVENOUS | Status: DC | PRN
  Administered 2016-08-30: 2 g via INTRAVENOUS

## 2016-08-30 MED ORDER — PROMETHAZINE HCL 25 MG/ML IJ SOLN
6.2500 mg | INTRAMUSCULAR | Status: DC | PRN
  Filled 2016-08-30: qty 1

## 2016-08-30 MED ORDER — OXYCODONE HCL 5 MG/5ML PO SOLN
5.0000 mg | Freq: Once | ORAL | Status: DC | PRN
  Filled 2016-08-30: qty 5

## 2016-08-30 MED ORDER — PROPOFOL 10 MG/ML IV BOLUS
INTRAVENOUS | Status: DC | PRN
  Administered 2016-08-30 (×2): 100 mg via INTRAVENOUS
  Administered 2016-08-30: 200 mg via INTRAVENOUS

## 2016-08-30 MED ORDER — FENTANYL CITRATE (PF) 100 MCG/2ML IJ SOLN
INTRAMUSCULAR | Status: AC
  Filled 2016-08-30: qty 2

## 2016-08-30 MED ORDER — LIDOCAINE-EPINEPHRINE (PF) 1 %-1:200000 IJ SOLN
INTRAMUSCULAR | Status: DC | PRN
  Administered 2016-08-30: 5 mL

## 2016-08-30 MED ORDER — PROPOFOL 10 MG/ML IV BOLUS
INTRAVENOUS | Status: AC
  Filled 2016-08-30: qty 20

## 2016-08-30 MED ORDER — KETOROLAC TROMETHAMINE 30 MG/ML IJ SOLN
INTRAMUSCULAR | Status: AC
  Filled 2016-08-30: qty 1

## 2016-08-30 MED ORDER — KETOROLAC TROMETHAMINE 30 MG/ML IJ SOLN
INTRAMUSCULAR | Status: DC | PRN
  Administered 2016-08-30: 30 mg via INTRAVENOUS

## 2016-08-30 MED ORDER — CEPHALEXIN 500 MG PO CAPS: 500.0000 mg | ORAL_CAPSULE | Freq: Four times a day (QID) | ORAL | 0 refills | Status: DC

## 2016-08-30 MED ORDER — PHENAZOPYRIDINE HCL 200 MG PO TABS: 200.0000 mg | ORAL_TABLET | Freq: Three times a day (TID) | ORAL | 0 refills | Status: DC | PRN

## 2016-08-30 MED ORDER — LIDOCAINE 2% (20 MG/ML) 5 ML SYRINGE
INTRAMUSCULAR | Status: AC
  Filled 2016-08-30: qty 5

## 2016-08-30 MED ORDER — FENTANYL CITRATE (PF) 100 MCG/2ML IJ SOLN
INTRAMUSCULAR | Status: DC | PRN
  Administered 2016-08-30 (×4): 25 ug via INTRAVENOUS
  Administered 2016-08-30: 50 ug via INTRAVENOUS
  Administered 2016-08-30 (×3): 25 ug via INTRAVENOUS
  Administered 2016-08-30: 50 ug via INTRAVENOUS
  Administered 2016-08-30: 25 ug via INTRAVENOUS

## 2016-08-30 MED ORDER — MIDAZOLAM HCL 5 MG/5ML IJ SOLN
INTRAMUSCULAR | Status: DC | PRN
  Administered 2016-08-30: 2 mg via INTRAVENOUS

## 2016-08-30 MED ORDER — DEXAMETHASONE SODIUM PHOSPHATE 10 MG/ML IJ SOLN
INTRAMUSCULAR | Status: AC
  Filled 2016-08-30: qty 1

## 2016-08-30 MED ORDER — LACTATED RINGERS IV SOLN
INTRAVENOUS | Status: DC
  Administered 2016-08-30 (×3): via INTRAVENOUS
  Filled 2016-08-30: qty 1000

## 2016-08-30 MED ORDER — CEFAZOLIN SODIUM 1 G IJ SOLR
INTRAMUSCULAR | Status: AC
  Filled 2016-08-30: qty 20

## 2016-08-30 MED FILL — CEPHALEXIN 500 MG CAPSULE: 500 | 5 days supply | Qty: 20 | Fill #0

## 2016-08-30 MED FILL — traMADol HCL 50 MG TABS: 50 | 2 days supply | Qty: 15 | Fill #0

## 2016-08-30 MED FILL — PHENAZOPYRIDINE 200 MG TAB: 200 | 4 days supply | Qty: 10 | Fill #0

## 2016-08-30 SURGICAL SUPPLY — 52 items
BAG URINE LEG 500ML (DRAIN) ×2 IMPLANT
BLADE CLIPPER SURG (BLADE) IMPLANT
BLADE HEX COATED 2.75 (ELECTRODE) IMPLANT
BLADE SURG 15 STRL LF DISP TIS (BLADE) ×1 IMPLANT
BLADE SURG 15 STRL SS (BLADE) ×3
CANISTER SUCTION 1200CC (MISCELLANEOUS) ×3 IMPLANT
CATH FOLEY 2WAY SLVR  5CC 16FR (CATHETERS) ×2
CATH FOLEY 2WAY SLVR 5CC 16FR (CATHETERS) IMPLANT
COVER BACK TABLE 60X90IN (DRAPES) ×3 IMPLANT
DRAIN PENROSE 18X1/4 LTX STRL (WOUND CARE) IMPLANT
DRAPE LG THREE QUARTER DISP (DRAPES) ×3 IMPLANT
DRAPE UNDERBUTTOCKS STRL (DRAPE) ×3 IMPLANT
ELECT REM PT RETURN 9FT ADLT (ELECTROSURGICAL) ×3
ELECTRODE REM PT RTRN 9FT ADLT (ELECTROSURGICAL) ×1 IMPLANT
GAUZE SPONGE 4X4 12PLY STRL LF (GAUZE/BANDAGES/DRESSINGS) IMPLANT
GAUZE SPONGE 4X4 16PLY XRAY LF (GAUZE/BANDAGES/DRESSINGS) IMPLANT
GLOVE BIO SURGEON STRL SZ7.5 (GLOVE) ×6 IMPLANT
GLOVE ECLIPSE 7.5 STRL STRAW (GLOVE) IMPLANT
GOWN STRL REUS W/ TWL LRG LVL3 (GOWN DISPOSABLE) ×2 IMPLANT
GOWN STRL REUS W/ TWL XL LVL3 (GOWN DISPOSABLE) ×1 IMPLANT
GOWN STRL REUS W/TWL LRG LVL3 (GOWN DISPOSABLE) ×3
GOWN STRL REUS W/TWL XL LVL3 (GOWN DISPOSABLE) ×3
HOLDER FOLEY CATH W/STRAP (MISCELLANEOUS) ×2 IMPLANT
KIT ROOM TURNOVER WOR (KITS) ×3 IMPLANT
MANIFOLD NEPTUNE II (INSTRUMENTS) IMPLANT
NEEDLE HYPO 22GX1.5 SAFETY (NEEDLE) ×3 IMPLANT
NS IRRIG 500ML POUR BTL (IV SOLUTION) ×3 IMPLANT
PACK BASIN DAY SURGERY FS (CUSTOM PROCEDURE TRAY) ×3 IMPLANT
PACKING VAGINAL (PACKING) IMPLANT
PENCIL BUTTON HOLSTER BLD 10FT (ELECTRODE) ×3 IMPLANT
PLUG CATH AND CAP STER (CATHETERS) ×2 IMPLANT
RETRACTOR LONRSTAR 16.6X16.6CM (MISCELLANEOUS) IMPLANT
RETRACTOR STAY HOOK 5MM (MISCELLANEOUS) ×2 IMPLANT
RETRACTOR STER APS 16.6X16.6CM (MISCELLANEOUS) ×3
SHEET LAVH (DRAPES) ×3 IMPLANT
SURGILUBE 2OZ TUBE FLIPTOP (MISCELLANEOUS) ×3 IMPLANT
SUT SILK 2 0 30  PSL (SUTURE)
SUT SILK 2 0 30 PSL (SUTURE) IMPLANT
SUT VIC AB 3-0 SH 27 (SUTURE)
SUT VIC AB 3-0 SH 27X BRD (SUTURE) IMPLANT
SUT VIC AB 4-0 RB1 27 (SUTURE)
SUT VIC AB 4-0 RB1 27X BRD (SUTURE) IMPLANT
SUT VICRYL 4-0 PS2 18IN ABS (SUTURE) ×2 IMPLANT
SYR BULB IRRIGATION 50ML (SYRINGE) ×3 IMPLANT
SYR CONTROL 10ML LL (SYRINGE) ×2 IMPLANT
SYRINGE 10CC LL (SYRINGE) ×2 IMPLANT
TOWEL OR 17X24 6PK STRL BLUE (TOWEL DISPOSABLE) ×5 IMPLANT
TRAY DSU PREP LF (CUSTOM PROCEDURE TRAY) ×3 IMPLANT
TUBE CONNECTING 12'X1/4 (SUCTIONS) ×1
TUBE CONNECTING 12X1/4 (SUCTIONS) ×3 IMPLANT
WATER STERILE IRR 500ML POUR (IV SOLUTION) IMPLANT
YANKAUER SUCT BULB TIP NO VENT (SUCTIONS) ×3 IMPLANT

## 2016-08-30 NOTE — Discharge Instructions (Signed)
°  Post Anesthesia Home Care Instructions  Activity: Get plenty of rest for the remainder of the day. A responsible adult should stay with you for 24 hours following the procedure.  For the next 24 hours, DO NOT: -Drive a car -Paediatric nurse -Drink alcoholic beverages -Take any medication unless instructed by your physician -Make any legal decisions or sign important papers.  Meals: Start with liquid foods such as gelatin or soup. Progress to regular foods as tolerated. Avoid greasy, spicy, heavy foods. If nausea and/or vomiting occur, drink only clear liquids until the nausea and/or vomiting subsides. Call your physician if vomiting continues.  Special Instructions/Symptoms: Your throat may feel dry or sore from the anesthesia or the breathing tube placed in your throat during surgery. If this causes discomfort, gargle with warm salt water. The discomfort should disappear within 24 hours.  If you had a scopolamine patch placed behind your ear for the management of post- operative nausea and/or vomiting:  1. The medication in the patch is effective for 72 hours, after which it should be removed.  Wrap patch in a tissue and discard in the trash. Wash hands thoroughly with soap and water. 2. You may remove the patch earlier than 72 hours if you experience unpleasant side effects which may include dry mouth, dizziness or visual disturbances. 3. Avoid touching the patch. Wash your hands with soap and water after contact with the patch.     Diet:  You may return to your normal diet within 24 hours following your surgery. You may note some mild nausea and possibly vomiting the first 6-8 hours following surgery. This is usually due to the side effects of anesthesia, and will disappear quite soon. I would suggest clear liquids and a very light meal the first evening following your surgery.  Activity:  Your physical activity is to be restricted, especially during the first 2 weeks home.  During this time use the following guidelines:   No lifting heavy objects (anything greater than 10 pounds).  No driving a car and limit long car rides.  No strenuous exercise, limits stairclimbing to a minimum.  Nothing per vagina for 1 month  Bowels:  You may need a stool softener and. A bowel movement every other day is reasonable. Use a mild laxative if needed, such as milk of magnesia 2-3 tablespoons, or 2 Dulcolax tablets. Call if you continue to have problems. If you had been taking narcotics for pain, before, during or after your surgery, you may be constipated. Take a laxative if necessary.  Medication:  You should resume your pre-surgery medications unless told not to. In addition you may be given an antibiotic to prevent or treat infection. Antibiotics are not always necessary. Pain pills (Tylox or Vicodin) may also be given to help with the incision and catheter discomfort. Tylenol (acetaminophen) or Advil (ibuprofen) which have no narcotics Arbetter if the pain is not too bad. All medication should be taken as prescribed until the bottles are finished unless you are having an unusual reaction to one of the drugs.  Problems you should report to [place]:  a. Fever greater than 101F. b. Heavy bleeding, or clots (see notes above about blood in urine). c. Inability to urinate. d. Drug reactions (hives, rash, nausea, vomiting, diarrhea). e. Severe burning or pain with urination that is not improving.

## 2016-08-30 NOTE — Anesthesia Preprocedure Evaluation (Signed)
Anesthesia Evaluation  Patient identified by MRN, date of birth, ID band Patient awake    Reviewed: Allergy & Precautions, NPO status , Patient's Chart, lab work & pertinent test results  Airway Mallampati: II  TM Distance: >3 FB Neck ROM: Full    Dental no notable dental hx.    Pulmonary neg pulmonary ROS,    Pulmonary exam normal breath sounds clear to auscultation       Cardiovascular negative cardio ROS Normal cardiovascular exam Rhythm:Regular Rate:Normal     Neuro/Psych negative neurological ROS  negative psych ROS   GI/Hepatic negative GI ROS, Neg liver ROS,   Endo/Other  negative endocrine ROS  Renal/GU negative Renal ROS  negative genitourinary   Musculoskeletal negative musculoskeletal ROS (+)   Abdominal   Peds negative pediatric ROS (+)  Hematology negative hematology ROS (+)   Anesthesia Other Findings   Reproductive/Obstetrics negative OB ROS                             Anesthesia Physical Anesthesia Plan  ASA: I  Anesthesia Plan: General   Post-op Pain Management:    Induction: Intravenous  Airway Management Planned: LMA  Additional Equipment:   Intra-op Plan:   Post-operative Plan: Extubation in OR  Informed Consent: I have reviewed the patients History and Physical, chart, labs and discussed the procedure including the risks, benefits and alternatives for the proposed anesthesia with the patient or authorized representative who has indicated his/her understanding and acceptance.   Dental advisory given  Plan Discussed with: CRNA and Surgeon  Anesthesia Plan Comments:         Anesthesia Quick Evaluation  

## 2016-08-30 NOTE — Transfer of Care (Signed)
Immediate Anesthesia Transfer of Care Note  Patient: Elizabeth Beasley  Procedure(s) Performed: Procedure(s) (LRB): BARTHOLIN'S GLAND CYST EXCISION (N/A)  Patient Location: PACU  Anesthesia Type: General  Level of Consciousness: awake, sedated, patient cooperative and responds to stimulation  Airway & Oxygen Therapy: Patient Spontanous Breathing and Patient connected to face mask oxygen  Post-op Assessment: Report given to PACU RN, Post -op Vital signs reviewed and stable and Patient moving all extremities  Post vital signs: Reviewed and stable  Complications: No apparent anesthesia complications

## 2016-08-30 NOTE — Anesthesia Procedure Notes (Signed)
Procedure Name: LMA Insertion Date/Time: 08/30/2016 2:30 PM Performed by: Justice Rocher Pre-anesthesia Checklist: Patient identified, Emergency Drugs available, Suction available and Patient being monitored Patient Re-evaluated:Patient Re-evaluated prior to inductionOxygen Delivery Method: Circle system utilized Preoxygenation: Pre-oxygenation with 100% oxygen Intubation Type: IV induction Ventilation: Mask ventilation without difficulty LMA: LMA inserted LMA Size: 4.0 Number of attempts: 1 Airway Equipment and Method: Bite block Placement Confirmation: positive ETCO2 and breath sounds checked- equal and bilateral Tube secured with: Tape Dental Injury: Teeth and Oropharynx as per pre-operative assessment

## 2016-08-30 NOTE — H&P (Signed)
Urethral Diverticulum  HPI: Elizabeth Beasley is a 35 year-old female patient who was referred by Dr. 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 The patient has had prior imaging to evaluate the urethra. Her symptoms have been stable over the last year.   She does have burning or discomfort when she urinates. She does have pain with intercourse . She does dribble at the end of urination.   Her urinary stream does not start and stop during voiding. She does have a split stream when she urinates. She is not having problems getting her urine stream started. She does not have to strain or bear down to start her urinary stream.   She does not have a history of urinary infections. She did not see the blood in her unine. She has not had discharge.   Patient states this has been bothersome for years since her pregnancy and would like to have it removed. She does have urinary leakage requiring her to wear pads.      ALLERGIES: None   MEDICATIONS: Amoxicillin 875 mg tablet  Birth Control     GU PSH: None   NON-GU PSH: Cesarean Delivery, 2010    GU PMH: None   NON-GU PMH: None   FAMILY HISTORY: 2 daughters - Daughter Colon Cancer - Father Diabetes - Mother   SOCIAL HISTORY: Marital Status: Married Current Smoking Status: Patient does not smoke anymore.  Does drink.  Drinks 1 caffeinated drink per day. 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    REVIEW OF SYSTEMS:    GU Review Female:   Patient reports leakage of urine. Patient denies frequent urination, hard to postpone urination, burning /pain with urination, get up at night to urinate, stream starts and stops, trouble starting your stream, have to strain to urinate, and currently pregnant.  Gastrointestinal (Upper):   Patient denies nausea, vomiting, and indigestion/ heartburn.  Gastrointestinal (Lower):   Patient denies diarrhea and constipation.  Constitutional:   Patient denies fever, night  sweats, weight loss, and fatigue.  Skin:   Patient denies itching and skin rash/ lesion.  Eyes:   Patient denies blurred vision and double vision.  Ears/ Nose/ Throat:   Patient denies sore throat and sinus problems.  Hematologic/Lymphatic:   Patient denies swollen glands and easy bruising.  Cardiovascular:   Patient denies leg swelling and chest pains.  Respiratory:   Patient denies cough and shortness of breath.  Endocrine:   Patient denies excessive thirst.  Musculoskeletal:   Patient denies back pain and joint pain.  Neurological:   Patient denies headaches and dizziness.  Psychologic:   Patient denies depression and anxiety.   VITAL SIGNS:      08/20/2016 02:10 PM  Weight 194 lb / 88 kg  Height 64 in / 162.56 cm  BP 106/67 mmHg  Pulse 91 /min  Temperature 98.4 F / 37 C  BMI 33.3 kg/m   GU PHYSICAL EXAMINATION:    External Genitalia: No hirsutism, no rash, no scarring, no cyst, no erythematous lesion, no papular lesion, no blanched lesion, no warty lesion. No edema.  Urethral Meatus: Normal size. Normal position. it is pushed laterally from mass-effect from the lesion that is lovated 4-6 o'clock. The area is soft and fluctuant and I was able to express purulent drainage. The area is tender.  Urethra: Urethral left lateral diverticulum, 1-3 cm diverticulum. soft, tender and fluctuant. No tenderness, no mass, no scarring. No urethral hypermobility. No leakage.  Bladder: Normal to palpation, no tenderness, no mass, normal size.  Vagina: No atrophy, no stenosis. No rectocele. No cystocele. No enterocele.   MULTI-SYSTEM PHYSICAL EXAMINATION:    Constitutional: Well-nourished. No physical deformities. Normally developed. Good grooming.  Neck: Neck symmetrical, not swollen. Normal tracheal position.  Respiratory: No labored breathing, no use of accessory muscles.   Cardiovascular: Normal temperature, normal extremity pulses, no swelling, no varicosities.  Lymphatic: No enlargement of  neck, axillae, groin.  Skin: No paleness, no jaundice, no cyanosis. No lesion, no ulcer, no rash.  Neurologic / Psychiatric: Oriented to time, oriented to place, oriented to person. No depression, no anxiety, no agitation.  Gastrointestinal: No mass, no tenderness, no rigidity, non obese abdomen.  Eyes: Normal conjunctivae. Normal eyelids.  Ears, Nose, Mouth, and Throat: Left ear no scars, no lesions, no masses. Right ear no scars, no lesions, no masses. Nose no scars, no lesions, no masses. Normal hearing. Normal lips.  Musculoskeletal: Normal gait and station of head and neck.     PAST DATA REVIEWED:  Source Of History:  Patient  Records Review:   Previous Patient Records  X-Ray Review: MRI Pelvis: Reviewed Films.     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 She is symptomatic and wishes to proceed with surgical expiration.   PLAN:           Orders Labs Urine Culture and Sensitivity          Document Letter(s):  Created for Patient: Clinical Summary         Notes:   I discussed the procedure of surgical excision of her cyst. She understands that with the proximity of the urethra that she is likely to require a catheter for a few days after the operation. She understands the risk of infection/bleeding and recurrence. We will try to ge this scheduled prior to the end of the year, but may  have to push this to the spring/summer if we are not able to schedule it before January.

## 2016-08-30 NOTE — Anesthesia Postprocedure Evaluation (Signed)
Anesthesia Post Note  Patient: Elizabeth Beasley Decesare  Procedure(s) Performed: Procedure(s) (LRB): BARTHOLIN'S GLAND CYST EXCISION (N/A)  Patient location during evaluation: PACU Anesthesia Type: General Level of consciousness: awake and alert Pain management: pain level controlled Vital Signs Assessment: post-procedure vital signs reviewed and stable Respiratory status: spontaneous breathing, nonlabored ventilation, respiratory function stable and patient connected to nasal cannula oxygen Cardiovascular status: blood pressure returned to baseline and stable Postop Assessment: no signs of nausea or vomiting Anesthetic complications: no    Last Vitals:  Vitals:   08/30/16 1645 08/30/16 1735  BP: 117/66 110/71  Pulse: 75 81  Resp: 14 16  Temp:  36.6 C    Last Pain:  Vitals:   08/30/16 1658  TempSrc:   PainSc: 0-No pain                 Catalina Gravel

## 2016-08-31 NOTE — Op Note (Signed)
Preoperative diagnosis:  1. Bartholin's Gland Cyst   Postoperative diagnosis:  1. same   Procedure: 1. Bartholin's Gland Cyst Excision  Surgeon: Ardis Hughs, MD  Anesthesia: General  Complications: None  Intraoperative findings: The cyst was located at the 4-6 O'clock location, had been decompressed prior to the procedure. Excising the capsule deep was challenging because of the depth and difficulty seeing.  At least three sides were excised completely.  EBL: 300cc  Specimens: None  Indication: Elizabeth Beasley is a 35 y.o. patient with symptomatic bartholin's gland cyst which is consistent with the MRI that was obtained.  After reviewing the management options for treatment, he elected to proceed with the above surgical procedure(s). We have discussed the potential benefits and risks of the procedure, side effects of the proposed treatment, the likelihood of the patient achieving the goals of the procedure, and any potential problems that might occur during the procedure or recuperation. Informed consent has been obtained.  Description of procedure:  The patient was taken to the operating room and general anesthesia was induced.  The patient was placed in the dorsal lithotomy position, prepped and draped in the usual sterile fashion, and preoperative antibiotics were administered. A preoperative time-out was performed.   Using the lone star retractor and gummi-hooks the labia were retracted.  A 30F foley was inserted and her bladder drained.  Using 0.5% marcaine with epi the field of dissection was injected.  The area was then marked and an incision was made with a 15 blade.  The overlying tissue was then dissected off the encapsulated cyst.  I then used an alis clamp to grab the cyst and retract it.  I was able to work the surrounding tissue off the edges of the cyst anteriorly, medially and laterally.  At this point the cyst tore and the edges were sent to pathology.  The  posterior edge was the grasp again and I removed as much of the wall of the cyst as I could see.    At then closed the space posteriorly with 4-0 vicryl and then a 3-0 in the more anterior space.  I closed the anterior vaginal mucosa with a 2-0 Vicryl. I then injected the remaining local into and around the area of dissection.  The foley catheter was then removed.  Vaginal packing was then placed that had been coated with Estrace cream.    The patient was subsequently extubated and returned to the PACU in stable condition.  Ardis Hughs, M.D.

## 2016-09-02 ENCOUNTER — Encounter (HOSPITAL_BASED_OUTPATIENT_CLINIC_OR_DEPARTMENT_OTHER): Payer: Self-pay | Admitting: Urology

## 2016-09-27 DIAGNOSIS — N361 Urethral diverticulum: Secondary | ICD-10-CM | POA: Diagnosis not present

## 2016-10-09 ENCOUNTER — Telehealth: Payer: Self-pay | Admitting: Family Medicine

## 2016-10-09 MED ORDER — IPRATROPIUM BROMIDE 0.06 % NA SOLN: 2.0000 | NASAL | 6 refills | Status: DC | PRN

## 2016-10-09 MED FILL — IPRATROPIUM 0.06% SPRAY: 0.06 | 7 days supply | Qty: 15 | Fill #0

## 2016-10-09 NOTE — Telephone Encounter (Signed)
Runny nose, cough, congestion.  Plan tx Atrovent nasal spray.

## 2016-10-15 ENCOUNTER — Telehealth: Payer: Self-pay | Admitting: *Deleted

## 2016-10-15 NOTE — Telephone Encounter (Signed)
Pt states that she has been on the birth control patch for 2 weeks now & is experiencing side effects such as sore breasts, stomach pain, moodiness & irritability.  She wants to know if she should finish the month out on the patches or if she can switch back to the pills now.  Please advise.

## 2016-10-15 NOTE — Telephone Encounter (Signed)
We can switch to a pill. Will just need to use a back up method for 1 week.  Does she have a preference for pill?

## 2016-10-16 ENCOUNTER — Other Ambulatory Visit: Payer: Self-pay | Admitting: *Deleted

## 2016-10-16 MED ORDER — NORETHIN ACE-ETH ESTRAD-FE 1-20 MG-MCG(24) PO TABS: 1.0000 | ORAL_TABLET | Freq: Every day | ORAL | 11 refills | Status: DC

## 2016-10-16 MED FILL — LARIN 24 FE 1 MG-20 MCG TAB: 1-20 | 84 days supply | Qty: 84 | Fill #0

## 2016-10-16 NOTE — Telephone Encounter (Signed)
Pt notified refill sent to pharmacy.

## 2016-10-23 ENCOUNTER — Encounter: Payer: 59 | Admitting: Physician Assistant

## 2016-10-23 ENCOUNTER — Telehealth: Payer: Self-pay | Admitting: *Deleted

## 2016-10-23 MED ORDER — OSELTAMIVIR PHOSPHATE 75 MG PO CAPS: 75.0000 mg | ORAL_CAPSULE | Freq: Every day | ORAL | 0 refills | Status: DC

## 2016-10-23 MED FILL — OSELTAMIVIR PHOS 75 MG CAP: 75 | 10 days supply | Qty: 10 | Fill #0

## 2016-10-23 NOTE — Telephone Encounter (Signed)
Pt's daughter had a positive flu test and will need a rx for tamiflu sent to the pharmacy.Elizabeth Beasley

## 2016-10-23 NOTE — Progress Notes (Signed)
   Subjective:    Patient ID: Elizabeth Beasley, female    DOB: January 04, 1981, 36 y.o.   MRN: [id number]  HPI Pt is a 36 yo female who presents to the clinic  Review of Systems     Objective:   Physical Exam        Assessment & Plan:    This encounter was created in error - please disregard.

## 2017-07-10 ENCOUNTER — Encounter: Payer: Self-pay | Admitting: Family Medicine

## 2017-07-10 ENCOUNTER — Ambulatory Visit (INDEPENDENT_AMBULATORY_CARE_PROVIDER_SITE_OTHER): Payer: 59 | Admitting: Family Medicine

## 2017-07-10 VITALS — BP 114/67 | HR 78 | Ht 64.17 in | Wt 196.0 lb

## 2017-07-10 DIAGNOSIS — L219 Seborrheic dermatitis, unspecified: Secondary | ICD-10-CM

## 2017-07-10 DIAGNOSIS — Z0000 Encounter for general adult medical examination without abnormal findings: Secondary | ICD-10-CM | POA: Diagnosis not present

## 2017-07-10 DIAGNOSIS — Z800 Family history of malignant neoplasm of digestive organs: Secondary | ICD-10-CM

## 2017-07-10 MED ORDER — HYDROCORTISONE VALERATE 0.2 % EX CREA: 1.0000 "application " | TOPICAL_CREAM | Freq: Every day | CUTANEOUS | 1 refills | Status: DC | PRN

## 2017-07-10 NOTE — Progress Notes (Signed)
Subjective:     Elizabeth Beasley is a 36 y.o. female and is here for a comprehensive physical exam. The patient reports problems - gets occ rash on sides of nose and in eyebrow area. comes and goes.  .  She will sometimes use over-the-counter hydrocortisone on it and that seems to help some.  Today it looks good.  She has started weight watchers through work.  They have a discounted price and she has been using the FreeStyle.  So far it is actually been going really well.  She is not planning on having any more children so is planning on having a ParaGard placed soon.  She also has a family history of colon cancer in her father as well as her father's sister.  Her father was diagnosed around age 39 and her father sister was diagnosed around age 48.  Social History   Social History  . Marital status: Married    Spouse name: Montine Circle  . Number of children: 2  . Years of education: Assoc   Occupational History  . CMA [employer]   Social History Main Topics  . Smoking status: Never Smoker  . Smokeless tobacco: Never Used  . Alcohol use No  . Drug use: No  . Sexual activity: Yes    Partners: Male    Birth control/ protection: Pill   Other Topics Concern  . Not on file   Social History Narrative   Regular exercise. No regular caffeine.   Health Maintenance  Topic Date Due  . PAP SMEAR  07/14/2017  . TETANUS/TDAP  07/02/2025  . INFLUENZA VACCINE  Completed  . HIV Screening  Completed    The following portions of the patient's history were reviewed and updated as appropriate: allergies, current medications, past family history, past medical history, past social history, past surgical history and problem list.  Review of Systems A comprehensive review of systems was negative.   Objective:    BP 114/67   Pulse 78   Ht 5' 4.17" (1.63 m)   Wt 196 lb (88.9 kg)   LMP 07/04/2017   SpO2 100%   BMI 33.46 kg/m  General appearance: alert, cooperative and appears stated  age Head: Normocephalic, without obvious abnormality, atraumatic Eyes: conj claer, EOMI, PEERLA Ears: normal TM's and external ear canals both ears Nose: Nares normal. Septum midline. Mucosa normal. No drainage or sinus tenderness. Throat: lips, mucosa, and tongue normal; teeth and gums normal Neck: no adenopathy, no carotid bruit, no JVD, supple, symmetrical, trachea midline and thyroid not enlarged, symmetric, no tenderness/mass/nodules Back: symmetric, no curvature. ROM normal. No CVA tenderness. Lungs: clear to auscultation bilaterally Heart: regular rate and rhythm, S1, S2 normal, no murmur, click, rub or gallop Abdomen: soft, non-tender; bowel sounds normal; no masses,  no organomegaly Extremities: extremities normal, atraumatic, no cyanosis or edema Pulses: 2+ and symmetric Skin: Skin color, texture, turgor normal. No rashes or lesions Lymph nodes: Cervical adenopathy: nl and Axillary adenopathy: not perfromed Neurologic: Alert and oriented X 3, normal strength and tone. Normal symmetric reflexes. Normal coordination and gait    Assessment:    Healthy female exam.      Plan:     See After Visit Summary for Counseling Recommendations   Keep up a regular exercise program and make sure you are eating a healthy diet Try to eat 4 servings of dairy a day, or if you are lactose intolerant take a calcium with vitamin D daily.  Your vaccines are up to date.  Seborrheic dermatitis-we will try Westcort.  That is not helpful then can switch to an antifungal if needed.  Family history of colon cancer-she may be a candidate for genetic testing.

## 2017-07-23 DIAGNOSIS — H524 Presbyopia: Secondary | ICD-10-CM | POA: Diagnosis not present

## 2017-07-23 DIAGNOSIS — H52223 Regular astigmatism, bilateral: Secondary | ICD-10-CM | POA: Diagnosis not present

## 2017-07-23 DIAGNOSIS — H43812 Vitreous degeneration, left eye: Secondary | ICD-10-CM | POA: Diagnosis not present

## 2017-07-23 DIAGNOSIS — H5213 Myopia, bilateral: Secondary | ICD-10-CM | POA: Diagnosis not present

## 2017-07-25 ENCOUNTER — Telehealth: Payer: Self-pay | Admitting: *Deleted

## 2017-07-25 MED ORDER — MISOPROSTOL 200 MCG PO TABS: ORAL_TABLET | ORAL | 0 refills | Status: DC

## 2017-07-25 NOTE — Telephone Encounter (Signed)
Pt scheduled for IUD insert 07/29/17.  Per protocol Cytotec 200 mg sent to pharmacy to take PO the night prior to procedure

## 2017-07-29 ENCOUNTER — Ambulatory Visit (INDEPENDENT_AMBULATORY_CARE_PROVIDER_SITE_OTHER): Payer: 59 | Admitting: Obstetrics and Gynecology

## 2017-07-29 ENCOUNTER — Encounter: Payer: Self-pay | Admitting: Obstetrics and Gynecology

## 2017-07-29 VITALS — BP 114/66 | HR 90 | Resp 16 | Ht 64.0 in | Wt 194.0 lb

## 2017-07-29 DIAGNOSIS — Z30430 Encounter for insertion of intrauterine contraceptive device: Secondary | ICD-10-CM

## 2017-07-29 DIAGNOSIS — Z124 Encounter for screening for malignant neoplasm of cervix: Secondary | ICD-10-CM

## 2017-07-29 DIAGNOSIS — Z01419 Encounter for gynecological examination (general) (routine) without abnormal findings: Secondary | ICD-10-CM | POA: Diagnosis not present

## 2017-07-29 DIAGNOSIS — Z3202 Encounter for pregnancy test, result negative: Secondary | ICD-10-CM

## 2017-07-29 DIAGNOSIS — Z1151 Encounter for screening for human papillomavirus (HPV): Secondary | ICD-10-CM

## 2017-07-29 MED ORDER — PARAGARD INTRAUTERINE COPPER IU IUD
INTRAUTERINE_SYSTEM | Freq: Once | INTRAUTERINE | Status: AC
  Administered 2017-07-29: 17:00:00 via INTRAUTERINE

## 2017-07-29 NOTE — Addendum Note (Signed)
Addended by: Asencion Islam on: 07/29/2017 04:36 PM   Modules accepted: Orders

## 2017-07-29 NOTE — Progress Notes (Signed)
Subjective:     Elizabeth Beasley is a 36 y.o. female G6P2 with BMI 33 who is here for a comprehensive physical exam. The patient reports no problems. The patient report a monthly period lasting 5-7 days. She is sexually active using condoms for contraception. Patient is interested in [place] IUD for birth control. Patient is without any other complaints  Past Medical History:  Diagnosis Date  . Bartholin gland cyst   . 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 CESAREAN SECTION  2010   Family History  Problem Relation Age of Onset  . Colon cancer Father 66  . Colon polyps Mother   . Hypertension Mother   . Diabetes Paternal Grandmother   . Stroke Paternal Grandmother   . Alcohol abuse Paternal Uncle   . Colon cancer Paternal Aunt 59    Social History   Socioeconomic History  . Marital status: Married    Spouse name: Montine Circle  . Number of children: 2  . Years of education: Assoc  . Highest education level: Not on file  Social Needs  . Financial resource strain: Not on file  . Food insecurity - worry: Not on file  . Food insecurity - inability: Not on file  . Transportation needs - medical: Not on file  . Transportation needs - non-medical: Not on file  Occupational History  . Occupation: CMA    Employer: [employer]  Tobacco Use  . Smoking status: Never Smoker  . Smokeless tobacco: Never Used  Substance and Sexual Activity  . Alcohol use: No  . Drug use: No  . Sexual activity: Yes    Partners: Male    Birth control/protection: None  Other Topics Concern  . Not on file  Social History Narrative   Regular exercise. No regular caffeine.   Health Maintenance  Topic Date Due  . PAP SMEAR  07/14/2017  . TETANUS/TDAP  07/02/2025  . INFLUENZA VACCINE  Completed  . HIV Screening  Completed     Review of Systems Pertinent items are noted in HPI.   Objective:  Blood pressure 114/66,  pulse 90, resp. rate 16, height 5\' 4"  (1.626 m), weight 194 lb (88 kg), last menstrual period 07/04/2017.     GENERAL: Well-developed, well-nourished female in no acute distress.  HEENT: Normocephalic, atraumatic. Sclerae anicteric.  NECK: Supple. Normal thyroid.  LUNGS: Clear to auscultation bilaterally.  HEART: Regular rate and rhythm. BREASTS: Symmetric in size. No palpable masses or lymphadenopathy, skin changes, or nipple drainage. ABDOMEN: Soft, nontender, nondistended. No organomegaly. PELVIC: Normal external female genitalia. Vagina is pink and rugated.  Normal discharge. Normal appearing cervix. Uterus is normal in size. No adnexal mass or tenderness. EXTREMITIES: No cyanosis, clubbing, or edema, 2+ distal pulses.    Assessment:    Healthy female exam.      Plan:    Pap smear collected Patient advised to perform monthly self breast exam Patient will be contacted with abnormal results  IUD insertion IUD Procedure Note Patient identified, informed consent performed, signed copy in chart, time out was performed.  Urine pregnancy test negative.  Speculum placed in the vagina.  Cervix visualized.  Cleaned with Betadine x 2.  Grasped anteriorly with a single tooth tenaculum.  Uterus sounded to 8 cm.  [place] IUD placed in a sterile fashion per manufacturer's recommendations.  Strings trimmed to 2 cm. Tenaculum was removed, good hemostasis noted.  Patient tolerated  procedure well.   Patient given post procedure instructions and IUD care card with expiration date.  Patient is asked to check IUD strings periodically and follow up in 4-6 weeks for IUD check.   See After Visit Summary for Counseling Recommendations

## 2017-08-04 LAB — CYTOLOGY - PAP
Diagnosis: NEGATIVE
HPV: NOT DETECTED

## 2017-08-26 ENCOUNTER — Ambulatory Visit: Payer: 59 | Admitting: Obstetrics and Gynecology

## 2017-09-15 ENCOUNTER — Encounter: Payer: Self-pay | Admitting: Family Medicine

## 2017-09-15 ENCOUNTER — Other Ambulatory Visit: Payer: Self-pay

## 2017-09-15 ENCOUNTER — Ambulatory Visit (INDEPENDENT_AMBULATORY_CARE_PROVIDER_SITE_OTHER): Payer: 59 | Admitting: Family Medicine

## 2017-09-15 VITALS — BP 111/57 | HR 87 | Ht 64.0 in | Wt 194.0 lb

## 2017-09-15 DIAGNOSIS — H20011 Primary iridocyclitis, right eye: Secondary | ICD-10-CM | POA: Diagnosis not present

## 2017-09-15 DIAGNOSIS — Z1159 Encounter for screening for other viral diseases: Secondary | ICD-10-CM | POA: Diagnosis not present

## 2017-09-15 DIAGNOSIS — H5711 Ocular pain, right eye: Secondary | ICD-10-CM

## 2017-09-15 DIAGNOSIS — H5789 Other specified disorders of eye and adnexa: Secondary | ICD-10-CM

## 2017-09-15 MED FILL — PREDNISOLONE AC 1% EYE DROP: 1 | 20 days supply | Qty: 5 | Fill #0

## 2017-09-15 NOTE — Progress Notes (Signed)
Subjective:    Patient ID: Elizabeth Beasley, female    DOB: Oct 24, 1980, 36 y.o.   MRN: [id number]  HPI 36 year old female comes in today complaining of right eye pain.  She said she had contacts on on Saturday and had some makeup on and when she was traveling back from [place] started to feel a little irritation in the eye.  She eventually took her contact out and then actually felt almost like it was scratched.  Since then she has been avoiding wearing contacts or make in the eye just feels sore and painful.  She is even having some light sensitivity.  No discharge drainage or crusting of the eye.  No fevers or chills.  She denies any significant visual changes. She did notices that she is seeing halos around lights that she didn't previously experience.     Review of Systems  BP (!) 111/57   Pulse 87   Ht 5\' 4"  (1.626 m)   Wt 194 lb (88 kg)   SpO2 100%   BMI 33.30 kg/m     No Known Allergies  Past Medical History:  Diagnosis Date  . Bartholin gland cyst   . 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 CESAREAN SECTION  2010  . URETHRAL CYST REMOVAL N/A 08/30/2016   Procedure: BARTHOLIN'S GLAND CYST EXCISION;  Surgeon: Ardis Hughs, MD;  Location: [facility];  Service: Urology;  Laterality: N/A;    Social History   Socioeconomic History  . Marital status: Married    Spouse name: Montine Circle  . Number of children: 2  . Years of education: Assoc  . Highest education level: Not on file  Social Needs  . Financial resource strain: Not on file  . Food insecurity - worry: Not on file  . Food insecurity - inability: Not on file  . Transportation needs - medical: Not on file  . Transportation needs - non-medical: Not on file  Occupational History  . Occupation: CMA    Employer: [employer]  Tobacco Use  . Smoking status: Never Smoker  . Smokeless tobacco: Never Used  Substance  and Sexual Activity  . Alcohol use: No  . Drug use: No  . Sexual activity: Yes    Partners: Male    Birth control/protection: None  Other Topics Concern  . Not on file  Social History Narrative   Regular exercise. No regular caffeine.    Family History  Problem Relation Age of Onset  . Colon cancer Father 83  . Colon polyps Mother   . Hypertension Mother   . Diabetes Paternal Grandmother   . Stroke Paternal Grandmother   . Alcohol abuse Paternal Uncle   . Colon cancer Paternal Aunt 49    Outpatient Encounter Medications as of 09/15/2017  Medication Sig  . hydrocortisone valerate cream (WESTCORT) 0.2 % Apply 1 application topically daily as needed.   No facility-administered encounter medications on file as of 09/15/2017.          Objective:   Physical Exam  Constitutional: She is oriented to person, place, and time. She appears well-developed and well-nourished.  HENT:  Head: Normocephalic and atraumatic.  Eyes: Conjunctivae and EOM are normal. Pupils are equal, round, and reactive to light. Right eye exhibits no discharge. Left eye exhibits no discharge.  Fluorescent drop along with tetracaine drop applied to right eye.  No corneal abrasion seen  with black light.  No crusting or discharge.  I was able to visualize some of the blood vessels but was unable to get a sharp clear view of the optic nerve.  I did not see any clouding.  Cardiovascular: Normal rate.  Pulmonary/Chest: Effort normal.  Neurological: She is alert and oriented to person, place, and time.  Skin: Skin is dry. No pallor.  Psychiatric: She has a normal mood and affect. Her behavior is normal.  Vitals reviewed.      Assessment & Plan:  Acute painful red right eye-no sign of corneal abrasion or scratch.  No sign of infection such as discharge or crusting.  Refer acutely to eye doctor for further evaluation.  I did not see any clamminess consistent with iritis.   We try to call her and get her in with  her established eye doctor but they were unable to fit her in for an appointment today.  Called [facility] and they were able to get her in at 1: 20 today.  Work and referral faxed.

## 2017-09-16 LAB — HEPATITIS B SURFACE ANTIBODY,QUALITATIVE: Hep B S Ab: REACTIVE — AB

## 2017-09-17 LAB — LIPID PANEL W/REFLEX DIRECT LDL
Cholesterol: 169 mg/dL (ref <200)
HDL: 48 mg/dL — ABNORMAL LOW (ref >50)
LDL Cholesterol (Calc): 107 mg/dL (calc) — ABNORMAL HIGH
NON-HDL CHOLESTEROL (CALC): 121 mg/dL (ref <130)
TRIGLYCERIDES: 57 mg/dL (ref <150)
Total CHOL/HDL Ratio: 3.5 (calc) (ref <5.0)

## 2017-09-17 LAB — COMPLETE METABOLIC PANEL WITH GFR
AG Ratio: 1 (calc) (ref 1.0–2.5)
ALKALINE PHOSPHATASE (APISO): 73 U/L (ref 33–115)
ALT: 12 U/L (ref 6–29)
AST: 12 U/L (ref 10–30)
Albumin: 3.6 g/dL (ref 3.6–5.1)
BUN: 12 mg/dL (ref 7–25)
CO2: 26 mmol/L (ref 20–32)
CREATININE: 0.62 mg/dL (ref 0.50–1.10)
Calcium: 8.7 mg/dL (ref 8.6–10.2)
Chloride: 103 mmol/L (ref 98–110)
GFR, Est African American: 134 mL/min/{1.73_m2} (ref >=60)
GFR, Est Non African American: 116 mL/min/{1.73_m2} (ref >=60)
GLUCOSE: 92 mg/dL (ref 65–99)
Globulin: 3.7 g/dL (calc) (ref 1.9–3.7)
Potassium: 4 mmol/L (ref 3.5–5.3)
Sodium: 137 mmol/L (ref 135–146)
Total Bilirubin: 0.5 mg/dL (ref 0.2–1.2)
Total Protein: 7.3 g/dL (ref 6.1–8.1)

## 2017-09-17 LAB — CBC
HEMATOCRIT: 31.5 % — AB (ref 35.0–45.0)
HEMOGLOBIN: 10.3 g/dL — AB (ref 11.7–15.5)
MCH: 27.7 pg (ref 27.0–33.0)
MCHC: 32.7 g/dL (ref 32.0–36.0)
MCV: 84.7 fL (ref 80.0–100.0)
MPV: 9.7 fL (ref 7.5–12.5)
Platelets: 402 10*3/uL — ABNORMAL HIGH (ref 140–400)
RBC: 3.72 10*6/uL — ABNORMAL LOW (ref 3.80–5.10)
RDW: 13 % (ref 11.0–15.0)
WBC: 9 10*3/uL (ref 3.8–10.8)

## 2017-09-17 LAB — FERRITIN: Ferritin: 23 ng/mL (ref 10–154)

## 2017-09-17 LAB — TSH: TSH: 2.83 m[IU]/L

## 2017-10-15 ENCOUNTER — Telehealth: Payer: Self-pay | Admitting: Physician Assistant

## 2017-10-15 MED ORDER — OSELTAMIVIR PHOSPHATE 75 MG PO CAPS: 75.0000 mg | ORAL_CAPSULE | Freq: Every day | ORAL | 0 refills | Status: DC

## 2017-10-15 MED FILL — OSELTAMIVIR PHOSPHATE 75 MG: 75 | 5 days supply | Qty: 10 | Fill #0

## 2017-10-15 NOTE — Telephone Encounter (Signed)
Pt's daughter tested positive for flu, requesting tamiflu be sent to [facility]. Rx sent.

## 2018-12-22 ENCOUNTER — Encounter: Payer: 59 | Admitting: Family Medicine

## 2019-01-07 ENCOUNTER — Telehealth: Payer: Self-pay

## 2019-01-07 DIAGNOSIS — Z800 Family history of malignant neoplasm of digestive organs: Secondary | ICD-10-CM

## 2019-01-07 DIAGNOSIS — K921 Melena: Secondary | ICD-10-CM

## 2019-01-07 NOTE — Telephone Encounter (Signed)
Elizabeth Beasley called and left a message stating she has blood in her stools. She would like a referral to GI for colonoscopy. Her father died from colon cancer and her mom has colon polyps. Please advise.

## 2019-01-07 NOTE — Telephone Encounter (Signed)
Referral placed. Elizabeth Beasley did want the virtual visit. Scheduled for tomorrow.

## 2019-01-07 NOTE — Telephone Encounter (Signed)
[facility] for referral.  Can use blood in stool and fam history. She is welcome to do an e-visit if she would like as well, but if just wants referral then that is OK too.

## 2019-01-08 ENCOUNTER — Ambulatory Visit (INDEPENDENT_AMBULATORY_CARE_PROVIDER_SITE_OTHER): Payer: No Typology Code available for payment source | Admitting: Family Medicine

## 2019-01-08 ENCOUNTER — Encounter: Payer: Self-pay | Admitting: Family Medicine

## 2019-01-08 VITALS — BP 120/64 | HR 95 | Ht 64.0 in

## 2019-01-08 DIAGNOSIS — K921 Melena: Secondary | ICD-10-CM | POA: Diagnosis not present

## 2019-01-08 NOTE — Progress Notes (Signed)
Virtual Visit via Telephone Note  I connected with Elizabeth Beasley on 01/08/19 at  2:20 PM EDT by a telephone teledicine application and verified that I am speaking with the correct person using two identifiers.   I discussed the limitations of evaluation and management by telemedicine and the availability of in person appointments. The patient expressed understanding and agreed to proceed.  Subjective:    CC: Blood in stool.  HPI:  38 year old female complains of seeing blood in her stool.  She has a family history of colon cancer.  Her father died from colon cancer ( dx at 36) and her mother has had polyps.  Noticed yesterday blood after BM. Notices it was bright red. No straining or pain with BM . Denies any hard stools or itchiness around her anus. She did start a new MVI. And stopped eating beef and pork 3 mos prior and recently ate a hamburger. She also reports that she is mid-cycle and was experiencing some abdominal pain and  Gassiness. She hasn't been taking any NSAIDS. No nausea.   This has never happened before. Does have hemorrhoids but that haven't been painful or flared. Mom dx with Adenoma at 77.     Past medical history, Surgical history, Family history not pertinant except as noted below, Social history, Allergies, and medications have been entered into the medical record, reviewed, and corrections made.   Review of Systems: No fevers, chills, night sweats, weight loss, chest pain, or shortness of breath.   Objective:    General: Speaking clearly in complete sentences without any shortness of breath.  Alert and oriented x3.  Normal judgment. No apparent acute distress.    Impression and Recommendations:     Blood in stool-it does not sound like it is gastritis related.  She is not having any pain or discomfort to suggest inflamed hemorrhoids or a tear.  She had a normal bowel movement without any pain or discomfort and then saw blood.  She has not had another bowel  movement yet to see if she is continued to bleed or not.  Encouraged her to let me know if she is continuing to see blood over the weekend.  With her family history she still greater than 10 years from when her father was diagnosed but interestingly her mother had an adenoma in her late 49s.  I would like to go ahead and refer her to GI for further work-up.  Without recurrence of bleeding it is unlikely to be ulcerative colitis or Crohn's disease but I do think further work-up is warranted.  Did explain that she may not be scheduled until the end of May or into June because of [place].  GI referral placed.    I discussed the assessment and treatment plan with the patient. The patient was provided an opportunity to ask questions and all were answered. The patient agreed with the plan and demonstrated an understanding of the instructions.   The patient was advised to call back or seek an in-person evaluation if the symptoms worsen or if the condition fails to improve as anticipated.   Beatrice Lecher, MD

## 2019-01-08 NOTE — Progress Notes (Signed)
Noticed yesterday. Denies any hard stools or itchiness around her anus. She did start a new MVI. And stopped eating beef and pork 3 mos prior and recently ate a hamburger. She also reports that she is mid-cycle and was experiencing some abdominal pain gassiness.Marland KitchenMarland KitchenMarland KitchenElouise Munroe, [place]

## 2019-01-12 ENCOUNTER — Ambulatory Visit (INDEPENDENT_AMBULATORY_CARE_PROVIDER_SITE_OTHER): Payer: No Typology Code available for payment source | Admitting: Gastroenterology

## 2019-01-12 ENCOUNTER — Encounter: Payer: Self-pay | Admitting: Gastroenterology

## 2019-01-12 ENCOUNTER — Other Ambulatory Visit: Payer: Self-pay

## 2019-01-12 VITALS — Ht 64.0 in | Wt 197.0 lb

## 2019-01-12 DIAGNOSIS — K625 Hemorrhage of anus and rectum: Secondary | ICD-10-CM

## 2019-01-12 DIAGNOSIS — Z800 Family history of malignant neoplasm of digestive organs: Secondary | ICD-10-CM

## 2019-01-12 NOTE — Progress Notes (Signed)
This patient contacted our office requesting a physician telemedicine video consultation regarding clinical questions and/or test results.  If new patient, they were referred by Elizabeth Lecher, MD  Participants on the conference : myself and the patient   The patient consented to phone consultation and was aware that a charge will be placed through their insurance.  I was in my office and the patient was at home   Encounter time:  Total time 22 minutes, with 15 minutes spent with patient on phone/webex    Elizabeth Lund, MD   _____________________________________________________________________________________________              [facility] Consult Note:  History: Elizabeth Beasley 01/12/2019  Referring physician: [name], MD  Reason for consult/chief complaint: Blood In Stools (one occurrence last thursday  bright red in color.  Also c/o bloating and cramping since last thursday)   Subjective  HPI:  Elizabeth Beasley has no chronic medical issues and no chronic digestive symptoms.  She has regular bowel movements which is usually every other day for her.  5 days ago she had painless rectal bleeding with bright red blood in the toilet bowl.  There is been no more bleeding since then, she has had regular BMs since then as well.  There is some lower abdominal bloating is slowly subsiding.  Father with colon cancer diagnosed in his early 10s. Mother apparently had a "large" colon polyp in her 79s.  ROS:  Review of Systems She denies chest pain dyspnea or dysuria  Past Medical History: Past Medical History:  Diagnosis Date  . Bartholin gland cyst   . 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 CESAREAN SECTION  2010  . URETHRAL CYST REMOVAL N/A 08/30/2016   Procedure: BARTHOLIN'S GLAND CYST EXCISION;  Surgeon: Ardis Hughs, MD;   Location: [facility];  Service: Urology;  Laterality: N/A;     Family History: Family History  Problem Relation Age of Onset  . Colon cancer Father 49  . Colon polyps Mother   . Hypertension Mother   . Diabetes Paternal Grandmother   . Stroke Paternal Grandmother   . Alcohol abuse Paternal Uncle   . Colon cancer Paternal Aunt 62    Social History: Social History   Socioeconomic History  . Marital status: Married    Spouse name: Montine Circle  . Number of children: 2  . Years of education: Assoc  . Highest education level: Not on file  Occupational History  . Occupation: CMA    Employer: [employer]  . Financial resource strain: Not on file  . Food insecurity:    Worry: Not on file    Inability: Not on file  . Transportation needs:    Medical: Not on file    Non-medical: Not on file  Tobacco Use  . Smoking status: Never Smoker  . Smokeless tobacco: Never Used  Substance and Sexual Activity  . Alcohol use: Yes    Comment: ocassional  . Drug use: No  . Sexual activity: Yes    Partners: Male    Birth control/protection: None  Lifestyle  . Physical activity:    Days per week: Not on file    Minutes per session: Not on file  . Stress: Not on file  Relationships  . Social connections:    Talks on phone: Not on file  Gets together: Not on file    Attends religious service: Not on file    Active member of club or organization: Not on file    Attends meetings of clubs or organizations: Not on file    Relationship status: Not on file  Other Topics Concern  . Not on file  Social History Narrative   Regular exercise. No regular caffeine.   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  Allergies: No Known Allergies  Outpatient Meds: Current Outpatient Medications  Medication Sig Dispense Refill  . Multiple Vitamins-Minerals (ADULT ONE DAILY GUMMIES PO) Take by mouth daily.     No current  facility-administered medications for this visit.       ___________________________________________________________________ Objective   No exam - virtual visit  No recent data  Assessment: Encounter Diagnoses  Name Primary?  . Rectal bleeding Yes  . Family history of colon cancer     Single episode of rectal bleeding, most likely benign such as hemorrhoids.  No pain, recent change in bowel habits or weight loss.  She is understandably concerned about this, especially in light of family history.  Plan:  Our staff will contact her to schedule a colonoscopy within the next 4 to 6 weeks.  She is agreeable after discussion of procedure and risks.  Thank you for the courtesy of this consult.  Please call me with any questions or concerns.  Nelida Meuse III  CC: Referring provider noted above

## 2019-01-21 ENCOUNTER — Encounter: Payer: 59 | Admitting: Family Medicine

## 2019-03-09 ENCOUNTER — Ambulatory Visit: Payer: No Typology Code available for payment source | Admitting: *Deleted

## 2019-03-09 ENCOUNTER — Other Ambulatory Visit: Payer: Self-pay

## 2019-03-09 VITALS — Ht 65.0 in | Wt 200.0 lb

## 2019-03-09 DIAGNOSIS — K625 Hemorrhage of anus and rectum: Secondary | ICD-10-CM

## 2019-03-09 DIAGNOSIS — Z800 Family history of malignant neoplasm of digestive organs: Secondary | ICD-10-CM

## 2019-03-09 MED ORDER — SUPREP BOWEL PREP KIT 17.5-3.13-1.6 GM/177ML PO SOLN: 1.0000 | Freq: Once | ORAL | 0 refills | Status: AC

## 2019-03-09 MED FILL — SUPREP BOWEL PREP KIT: 17.5-3.13-1 | 2 days supply | Qty: 354 | Fill #0

## 2019-03-09 NOTE — Progress Notes (Signed)
No egg or soy allergy known to patient  No issues with past sedation with any surgeries  or procedures, no intubation problems  No diet pills per patient No home 02 use per patient  No blood thinners per patient  Pt denies issues with constipation  No A fib or A flutter  EMMI video sent to pt's e mail   Pt verified name, DOB, address and insurance during PV today. Pt mailed instruction packet to included paper to complete and mail back to [facility] with addressed and stamped envelope, Emmi video, copy of consent form to read and not return, and instructions. PV completed over the phone. Pt encouraged to call with questions or issues   Pt is aware that care partner will wait in the car during parking lot; if they feel like they will be too hot to wait in the car; they may wait in the lobby.  We want them to wear a mask (we do not have any that we can provide them), practice social distancing, and we will check their temperatures when they get here.  I did remind patient that their care partner needs to stay in the parking lot the entire time. Pt will wear mask into building

## 2019-03-15 ENCOUNTER — Ambulatory Visit (INDEPENDENT_AMBULATORY_CARE_PROVIDER_SITE_OTHER): Payer: No Typology Code available for payment source | Admitting: Family Medicine

## 2019-03-15 ENCOUNTER — Encounter: Payer: Self-pay | Admitting: Family Medicine

## 2019-03-15 VITALS — BP 115/59 | HR 88 | Ht 65.0 in | Wt 202.0 lb

## 2019-03-15 DIAGNOSIS — Z0000 Encounter for general adult medical examination without abnormal findings: Secondary | ICD-10-CM

## 2019-03-15 NOTE — Patient Instructions (Signed)
Health Maintenance, Female Adopting a healthy lifestyle and getting preventive care are important in promoting health and wellness. Ask your health care provider about:  The right schedule for you to have regular tests and exams.  Things you can do on your own to prevent diseases and keep yourself healthy. What should I know about diet, weight, and exercise? Eat a healthy diet   Eat a diet that includes plenty of vegetables, fruits, low-fat dairy products, and lean protein.  Do not eat a lot of foods that are high in solid fats, added sugars, or sodium. Maintain a healthy weight Body mass index (BMI) is used to identify weight problems. It estimates body fat based on height and weight. Your health care provider can help determine your BMI and help you achieve or maintain a healthy weight. Get regular exercise Get regular exercise. This is one of the most important things you can do for your health. Most adults should:  Exercise for at least 150 minutes each week. The exercise should increase your heart rate and make you sweat (moderate-intensity exercise).  Do strengthening exercises at least twice a week. This is in addition to the moderate-intensity exercise.  Spend less time sitting. Even light physical activity can be beneficial. Watch cholesterol and blood lipids Have your blood tested for lipids and cholesterol at 38 years of age, then have this test every 5 years. Have your cholesterol levels checked more often if:  Your lipid or cholesterol levels are high.  You are older than 38 years of age.  You are at high risk for heart disease. What should I know about cancer screening? Depending on your health history and family history, you may need to have cancer screening at various ages. This may include screening for:  Breast cancer.  Cervical cancer.  Colorectal cancer.  Skin cancer.  Lung cancer. What should I know about heart disease, diabetes, and high blood  pressure? Blood pressure and heart disease  High blood pressure causes heart disease and increases the risk of stroke. This is more likely to develop in people who have high blood pressure readings, are of African descent, or are overweight.  Have your blood pressure checked: ? Every 3-5 years if you are 18-39 years of age. ? Every year if you are 40 years old or older. Diabetes Have regular diabetes screenings. This checks your fasting blood sugar level. Have the screening done:  Once every three years after age 40 if you are at a normal weight and have a low risk for diabetes.  More often and at a younger age if you are overweight or have a high risk for diabetes. What should I know about preventing infection? Hepatitis B If you have a higher risk for hepatitis B, you should be screened for this virus. Talk with your health care provider to find out if you are at risk for hepatitis B infection. Hepatitis C Testing is recommended for:  Everyone born from 1945 through 1965.  Anyone with known risk factors for hepatitis C. Sexually transmitted infections (STIs)  Get screened for STIs, including gonorrhea and chlamydia, if: ? You are sexually active and are younger than 38 years of age. ? You are older than 38 years of age and your health care provider tells you that you are at risk for this type of infection. ? Your sexual activity has changed since you were last screened, and you are at increased risk for chlamydia or gonorrhea. Ask your health care provider if   you are at risk.  Ask your health care provider about whether you are at high risk for HIV. Your health care provider may recommend a prescription medicine to help prevent HIV infection. If you choose to take medicine to prevent HIV, you should first get tested for HIV. You should then be tested every 3 months for as long as you are taking the medicine. Pregnancy  If you are about to stop having your period (premenopausal) and  you may become pregnant, seek counseling before you get pregnant.  Take 400 to 800 micrograms (mcg) of folic acid every day if you become pregnant.  Ask for birth control (contraception) if you want to prevent pregnancy. Osteoporosis and menopause Osteoporosis is a disease in which the bones lose minerals and strength with aging. This can result in bone fractures. If you are 65 years old or older, or if you are at risk for osteoporosis and fractures, ask your health care provider if you should:  Be screened for bone loss.  Take a calcium or vitamin D supplement to lower your risk of fractures.  Be given hormone replacement therapy (HRT) to treat symptoms of menopause. Follow these instructions at home: Lifestyle  Do not use any products that contain nicotine or tobacco, such as cigarettes, e-cigarettes, and chewing tobacco. If you need help quitting, ask your health care provider.  Do not use street drugs.  Do not share needles.  Ask your health care provider for help if you need support or information about quitting drugs. Alcohol use  Do not drink alcohol if: ? Your health care provider tells you not to drink. ? You are pregnant, may be pregnant, or are planning to become pregnant.  If you drink alcohol: ? Limit how much you use to 0-1 drink a day. ? Limit intake if you are breastfeeding.  Be aware of how much alcohol is in your drink. In the [place] one drink equals one 12 oz bottle of beer (355 mL), one 5 oz glass of wine (148 mL), or one 1 oz glass of hard liquor (44 mL). General instructions  Schedule regular health, dental, and eye exams.  Stay current with your vaccines.  Tell your health care provider if: ? You often feel depressed. ? You have ever been abused or do not feel safe at home. Summary  Adopting a healthy lifestyle and getting preventive care are important in promoting health and wellness.  Follow your health care provider's instructions about healthy  diet, exercising, and getting tested or screened for diseases.  Follow your health care provider's instructions on monitoring your cholesterol and blood pressure. This information is not intended to replace advice given to you by your health care provider. Make sure you discuss any questions you have with your health care provider. Document Released: 03/18/2011 Document Revised: 08/26/2018 Document Reviewed: 08/26/2018 Elsevier Patient Education  2020 [employer]  

## 2019-03-15 NOTE — Progress Notes (Signed)
Subjective:     Elizabeth Beasley is a 38 y.o. female and is here for a comprehensive physical exam. The patient reports no problems.  She is doing well.  She is been exercising most days with walking and doing the treadmill on the weekends. She has been doing exercise videos with [employer].   Social History   Socioeconomic History  . Marital status: Married    Spouse name: Montine Circle  . Number of children: 2  . Years of education: Assoc  . Highest education level: Not on file  Occupational History  . Occupation: CMA    Employer: [employer]  . Financial resource strain: Not on file  . Food insecurity    Worry: Not on file    Inability: Not on file  . Transportation needs    Medical: Not on file    Non-medical: Not on file  Tobacco Use  . Smoking status: Never Smoker  . Smokeless tobacco: Never Used  Substance and Sexual Activity  . Alcohol use: Yes    Comment: ocassional  . Drug use: No  . Sexual activity: Yes    Partners: Male    Birth control/protection: None  Lifestyle  . Physical activity    Days per week: Not on file    Minutes per session: Not on file  . Stress: Not on file  Relationships  . Social Herbalist on phone: Not on file    Gets together: Not on file    Attends religious service: Not on file    Active member of club or organization: Not on file    Attends meetings of clubs or organizations: Not on file    Relationship status: Not on file  . Intimate partner violence    Fear of current or ex partner: Not on file    Emotionally abused: Not on file    Physically abused: Not on file    Forced sexual activity: Not on file  Other Topics Concern  . Not on file  Social History Narrative   Regular exercise. No regular caffeine.   Health Maintenance  Topic Date Due  . INFLUENZA VACCINE  04/17/2019  . PAP SMEAR-Modifier  07/29/2020  . TETANUS/TDAP  07/02/2025  . HIV Screening  Completed    The following portions of the  patient's history were reviewed and updated as appropriate: allergies, current medications, past family history, past medical history, past social history, past surgical history and problem list.  Review of Systems A comprehensive review of systems was negative.   Objective:    BP (!) 115/59   Pulse 88   Ht 5\' 5"  (1.651 m)   Wt 202 lb (91.6 kg)   LMP 03/13/2019 (Exact Date) Comment: Copper IUD   SpO2 100%   BMI 33.61 kg/m  General appearance: alert, cooperative and appears stated age Head: Normocephalic, without obvious abnormality, atraumatic Eyes: conj clear, EOMi, PEERLA Ears: normal TM's and external ear canals both ears Nose: Nares normal. Septum midline. Mucosa normal. No drainage or sinus tenderness. Throat: lips, mucosa, and tongue normal; teeth and gums normal Neck: no adenopathy, no carotid bruit, no JVD, supple, symmetrical, trachea midline and thyroid not enlarged, symmetric, no tenderness/mass/nodules Back: symmetric, no curvature. ROM normal. No CVA tenderness. Lungs: clear to auscultation bilaterally Heart: regular rate and rhythm, S1, S2 normal, no murmur, click, rub or gallop Abdomen: soft, non-tender; bowel sounds normal; no masses,  no organomegaly Extremities: extremities normal, atraumatic, no cyanosis or edema  Pulses: 2+ and symmetric Skin: Skin color, texture, turgor normal. No rashes or lesions Lymph nodes: Cervical, supraclavicular, and axillary nodes normal. Neurologic: Alert and oriented X 3, normal strength and tone. Normal symmetric reflexes. Normal coordination and gait    Assessment:    Healthy female exam.      Plan:     See After Visit Summary for Counseling Recommendations    Keep up a regular exercise program and make sure you are eating a healthy diet Try to eat 4 servings of dairy a day, or if you are lactose intolerant take a calcium with vitamin D daily.  Your vaccines are up to date.

## 2019-03-16 LAB — CBC
HCT: 28.6 % — ABNORMAL LOW (ref 35.0–45.0)
Hemoglobin: 8.5 g/dL — ABNORMAL LOW (ref 11.7–15.5)
MCH: 22 pg — ABNORMAL LOW (ref 27.0–33.0)
MCHC: 29.7 g/dL — ABNORMAL LOW (ref 32.0–36.0)
MCV: 73.9 fL — ABNORMAL LOW (ref 80.0–100.0)
MPV: 9.3 fL (ref 7.5–12.5)
Platelets: 471 10*3/uL — ABNORMAL HIGH (ref 140–400)
RBC: 3.87 10*6/uL (ref 3.80–5.10)
RDW: 17.6 % — ABNORMAL HIGH (ref 11.0–15.0)
WBC: 7.9 10*3/uL (ref 3.8–10.8)

## 2019-03-16 LAB — COMPLETE METABOLIC PANEL WITH GFR
AG Ratio: 0.9 (calc) — ABNORMAL LOW (ref 1.0–2.5)
ALT: 10 U/L (ref 6–29)
AST: 10 U/L (ref 10–30)
Albumin: 3.6 g/dL (ref 3.6–5.1)
Alkaline phosphatase (APISO): 61 U/L (ref 31–125)
BUN: 12 mg/dL (ref 7–25)
CO2: 27 mmol/L (ref 20–32)
Calcium: 8.9 mg/dL (ref 8.6–10.2)
Chloride: 106 mmol/L (ref 98–110)
Creat: 0.58 mg/dL (ref 0.50–1.10)
GFR, Est African American: 136 mL/min/{1.73_m2} (ref >=60)
GFR, Est Non African American: 118 mL/min/{1.73_m2} (ref >=60)
Globulin: 4 g/dL (calc) — ABNORMAL HIGH (ref 1.9–3.7)
Glucose, Bld: 91 mg/dL (ref 65–99)
Potassium: 4.3 mmol/L (ref 3.5–5.3)
Sodium: 139 mmol/L (ref 135–146)
Total Bilirubin: 0.5 mg/dL (ref 0.2–1.2)
Total Protein: 7.6 g/dL (ref 6.1–8.1)

## 2019-03-16 LAB — LIPID PANEL W/REFLEX DIRECT LDL
Cholesterol: 152 mg/dL (ref <200)
HDL: 44 mg/dL — ABNORMAL LOW (ref >=50)
LDL Cholesterol (Calc): 95 mg/dL (calc)
Non-HDL Cholesterol (Calc): 108 mg/dL (calc) (ref <130)
Total CHOL/HDL Ratio: 3.5 (calc) (ref <5.0)
Triglycerides: 44 mg/dL (ref <150)

## 2019-03-17 ENCOUNTER — Other Ambulatory Visit: Payer: Self-pay | Admitting: *Deleted

## 2019-03-17 DIAGNOSIS — R771 Abnormality of globulin: Secondary | ICD-10-CM

## 2019-03-17 DIAGNOSIS — R790 Abnormal level of blood mineral: Secondary | ICD-10-CM

## 2019-03-18 ENCOUNTER — Telehealth: Payer: Self-pay | Admitting: Gastroenterology

## 2019-03-18 NOTE — Telephone Encounter (Signed)
Discussed with pt that taking the MVI with iron should not be a problem.

## 2019-03-18 NOTE — Telephone Encounter (Signed)
Patient has procedure on 03/23/19. Patient states that she recently had her physical and says hemoglobin was 8.3. she says that her primary care told her to start taking multivitamin with iron in it. She wanted to let us know prior to her procedure and wanted to know if it is ok to take this prior to procedure.

## 2019-03-22 ENCOUNTER — Telehealth: Payer: Self-pay | Admitting: Gastroenterology

## 2019-03-22 NOTE — Telephone Encounter (Signed)

## 2019-03-23 ENCOUNTER — Other Ambulatory Visit: Payer: Self-pay

## 2019-03-23 ENCOUNTER — Ambulatory Visit (AMBULATORY_SURGERY_CENTER): Payer: No Typology Code available for payment source | Admitting: Gastroenterology

## 2019-03-23 ENCOUNTER — Encounter: Payer: Self-pay | Admitting: Gastroenterology

## 2019-03-23 VITALS — BP 114/67 | HR 97 | Temp 99.3°F | Resp 16 | Ht 65.0 in | Wt 202.0 lb

## 2019-03-23 DIAGNOSIS — Z1211 Encounter for screening for malignant neoplasm of colon: Secondary | ICD-10-CM

## 2019-03-23 DIAGNOSIS — Z800 Family history of malignant neoplasm of digestive organs: Secondary | ICD-10-CM

## 2019-03-23 DIAGNOSIS — Z83719 Family history of colon polyps, unspecified: Secondary | ICD-10-CM

## 2019-03-23 DIAGNOSIS — Z8371 Family history of colonic polyps: Secondary | ICD-10-CM | POA: Diagnosis present

## 2019-03-23 MED ORDER — SODIUM CHLORIDE 0.9 % IV SOLN: 500.0000 mL | Freq: Once | INTRAVENOUS | Status: DC

## 2019-03-23 NOTE — Progress Notes (Signed)
Pt's states no medical or surgical changes since previsit or office visit. 

## 2019-03-23 NOTE — Patient Instructions (Signed)
YOU HAD AN ENDOSCOPIC PROCEDURE TODAY AT [facility]:   Refer to the procedure report that was given to you for any specific questions about what was found during the examination.  If the procedure report does not answer your questions, please call your gastroenterologist to clarify.  If you requested that your care partner not be given the details of your procedure findings, then the procedure report has been included in a sealed envelope for you to review at your convenience later.  YOU SHOULD EXPECT: Some feelings of bloating in the abdomen. Passage of more gas than usual.  Walking can help get rid of the air that was put into your GI tract during the procedure and reduce the bloating. If you had a lower endoscopy (such as a colonoscopy or flexible sigmoidoscopy) you may notice spotting of blood in your stool or on the toilet paper. If you underwent a bowel prep for your procedure, you may not have a normal bowel movement for a few days.  Please Note:  You might notice some irritation and congestion in your nose or some drainage.  This is from the oxygen used during your procedure.  There is no need for concern and it should clear up in a day or so.  SYMPTOMS TO REPORT IMMEDIATELY:   Following lower endoscopy (colonoscopy or flexible sigmoidoscopy):  Excessive amounts of blood in the stool  Significant tenderness or worsening of abdominal pains  Swelling of the abdomen that is new, acute  Fever of 100F or higher  For urgent or emergent issues, a gastroenterologist can be reached at any hour by calling [phone].   DIET:  We do recommend a small meal at first, but then you may proceed to your regular diet.  Drink plenty of fluids but you should avoid alcoholic beverages for 24 hours.  ACTIVITY:  You should plan to take it easy for the rest of today and you should NOT DRIVE or use heavy machinery until tomorrow (because of the sedation medicines used during the test).     FOLLOW UP: Our staff will call the number listed on your records 48-72 hours following your procedure to check on you and address any questions or concerns that you may have regarding the information given to you following your procedure. If we do not reach you, we will leave a message.  We will attempt to reach you two times.  During this call, we will ask if you have developed any symptoms of COVID 19. If you develop any symptoms (ie: fever, flu-like symptoms, shortness of breath, cough etc.) before then, please call [phone].  If you test positive for Covid 19 in the 2 weeks post procedure, please call and report this information to [place].    If any biopsies were taken you will be contacted by phone or by letter within the next 1-3 weeks.  Please call us at [phone] if you have not heard about the biopsies in 3 weeks.   Repeat Colonoscopy in 5 years  SIGNATURES/CONFIDENTIALITY: You and/or your care partner have signed paperwork which will be entered into your electronic medical record.  These signatures attest to the fact that that the information above on your After Visit Summary has been reviewed and is understood.  Full responsibility of the confidentiality of this discharge information lies with you and/or your care-partner.

## 2019-03-23 NOTE — Progress Notes (Signed)
To PACU, VSS. Report to [email or web address] 

## 2019-03-23 NOTE — Op Note (Signed)
[facility] Patient Name: Elizabeth Beasley Procedure Date: 03/23/2019 10:19 AM MRN: [id number] Endoscopist: Mallie Mussel L. Loletha Carrow , MD Age: 38 Referring MD:  Date of Birth: 11-09-1980 Gender: Female Account #: [id number] Procedure:                Colonoscopy Indications:              Colon cancer screening in patient with 1st-degree                            relative having advanced adenoma of the colon                            before age 37 (mother reportedly with advanced                            polyp in 78's), also Colorectal cancer in father Medicines:                Monitored Anesthesia Care Procedure:                Pre-Anesthesia Assessment:                           - Prior to the procedure, a History and Physical                            was performed, and patient medications and                            allergies were reviewed. The patient's tolerance of                            previous anesthesia was also reviewed. The risks                            and benefits of the procedure and the sedation                            options and risks were discussed with the patient.                            All questions were answered, and informed consent                            was obtained. Prior Anticoagulants: The patient has                            taken no previous anticoagulant or antiplatelet                            agents. ASA Grade Assessment: II - A patient with                            mild systemic disease. After reviewing the risks  and benefits, the patient was deemed in                            satisfactory condition to undergo the procedure.                           After obtaining informed consent, the colonoscope                            was passed under direct vision. Throughout the                            procedure, the patient's blood pressure, pulse, and                            oxygen  saturations were monitored continuously. The                            Colonoscope was introduced through the anus and                            advanced to the the cecum, identified by                            appendiceal orifice and ileocecal valve. The                            colonoscopy was performed without difficulty. The                            patient tolerated the procedure well. The quality                            of the bowel preparation was good. The ileocecal                            valve, appendiceal orifice, and rectum were                            photographed. Scope In: 10:23:36 AM Scope Out: 10:37:22 AM Scope Withdrawal Time: 0 hours 8 minutes 51 seconds  Total Procedure Duration: 0 hours 13 minutes 46 seconds  Findings:                 The perianal and digital rectal examinations were                            normal.                           The entire examined colon appeared normal on direct                            and retroflexion views. Complications:            No immediate complications. Estimated Blood Loss:  Estimated blood loss: none. Impression:               - The entire examined colon is normal on direct and                            retroflexion views.                           - No specimens collected.                           Patient had recerntly experienced a single,                            self-limited episode of benign anal bleeding. Recommendation:           - Patient has a contact number available for                            emergencies. The signs and symptoms of potential                            delayed complications were discussed with the                            patient. Return to normal activities tomorrow.                            Written discharge instructions were provided to the                            patient.                           - Resume previous diet.                           -  Continue present medications.                           - Repeat colonoscopy in 5 years for screening                            purposes. Landers Prajapati L. Loletha Carrow, MD 03/23/2019 10:45:06 AM This report has been signed electronically.

## 2019-03-25 ENCOUNTER — Telehealth: Payer: Self-pay

## 2019-03-25 NOTE — Telephone Encounter (Signed)
  Follow up Call-  Call back number 03/23/2019  Post procedure Call Back phone  # [phone]  Permission to leave phone message Yes  Some recent data might be hidden     Patient questions:  Do you have a fever, pain , or abdominal swelling? No. Pain Score  0 *  Have you tolerated food without any problems? Yes.    Have you been able to return to your normal activities? Yes.    Do you have any questions about your discharge instructions: Diet   No. Medications  No. Follow up visit  No.  Do you have questions or concerns about your Care? No.  Actions: * If pain score is 4 or above: No action needed, pain <4. 1. Have you developed a fever since your procedure? no  2.   Have you had an respiratory symptoms (SOB or cough) since your procedure? no  3.   Have you tested positive for COVID 19 since your procedure no  4.   Have you had any family members/close contacts diagnosed with the COVID 19 since your procedure?  no   If yes to any of these questions please route to Joylene John, RN and Alphonsa Gin, Therapist, sports.

## 2019-05-14 ENCOUNTER — Other Ambulatory Visit: Payer: Self-pay

## 2019-05-14 ENCOUNTER — Encounter: Payer: Self-pay | Admitting: Family Medicine

## 2019-05-14 ENCOUNTER — Ambulatory Visit (INDEPENDENT_AMBULATORY_CARE_PROVIDER_SITE_OTHER): Payer: No Typology Code available for payment source | Admitting: Family Medicine

## 2019-05-14 ENCOUNTER — Ambulatory Visit (INDEPENDENT_AMBULATORY_CARE_PROVIDER_SITE_OTHER): Payer: No Typology Code available for payment source

## 2019-05-14 VITALS — BP 117/63 | HR 125 | Temp 97.3°F | Ht 65.0 in | Wt 195.0 lb

## 2019-05-14 DIAGNOSIS — D509 Iron deficiency anemia, unspecified: Secondary | ICD-10-CM

## 2019-05-14 DIAGNOSIS — Z23 Encounter for immunization: Secondary | ICD-10-CM | POA: Diagnosis not present

## 2019-05-14 DIAGNOSIS — E8809 Other disorders of plasma-protein metabolism, not elsewhere classified: Secondary | ICD-10-CM

## 2019-05-14 DIAGNOSIS — R221 Localized swelling, mass and lump, neck: Secondary | ICD-10-CM | POA: Diagnosis not present

## 2019-05-14 DIAGNOSIS — R779 Abnormality of plasma protein, unspecified: Secondary | ICD-10-CM

## 2019-05-14 DIAGNOSIS — I889 Nonspecific lymphadenitis, unspecified: Secondary | ICD-10-CM

## 2019-05-14 DIAGNOSIS — R590 Localized enlarged lymph nodes: Secondary | ICD-10-CM

## 2019-05-14 NOTE — Progress Notes (Signed)
Acute Office Visit  Subjective:    Patient ID: Elizabeth Beasley, female    DOB: 1981/08/03, 38 y.o.   MRN: [id number]  Chief Complaint  Patient presents with   lump on neck    R side x    HPI Patient is in today for a lump on the left side of her neck x 2 weeks.  Otherwise healthy young female.  For couple of months now she is just had a little bit of sinus irritation she says she is been having some postnasal drip and occasionally some itching and irritation in the ears.  Usually it is worse on the right side and right ear.  She said she really did not think much of it just thought maybe it was just some allergy symptoms.  But then more recently started noticing some discomfort and tenderness in the right side of her neck.  Again initially did not think much of it because at work the ergonomic position of her keyboard and computer is a little awkward and she oftentimes has to turn her head and tends to raise her shoulders because she gets a lot of tension in her upper back and neck.  But has started bothering her bothering her more than usual but then she felt like she was actually feeling a few little lumps.  She said she did check the rest of her body including the axillary region and the breast tissue and so she did not palpate any other lesions.  She denies any fevers sweats, night sweats, or chills.  She did have blood work about 2 months ago and was noted to be severely anemic with a hemoglobin of 8.5 and an MCV of 73.  She was only mildly anemic with a hemoglobin 10.3 about a year ago.  But says that since having had the copper T IUD placed she is been having prolonged menstrual cycles and so felt like she probably been just losing a lot of blood.  She denies any significant changes in weight.  Though she is down about 7 pounds from about 6 weeks ago.  She says she was on her period at that time and so felt like she was probably retaining a little bit of fluid. Using Heat, ice, IBU and  Zyrtec.    She denies any family history of any type of lymphoma or leukemia.  There is a family history of colon cancer.  She denies any chest symptoms or stomach issues.  No abdominal pain.  She has noticed some voice change in the last month or so.  Again she thought maybe it was just from irritation in her sinuses from allergies.  She did recently start on an iron supplement to help with the iron deficiency.  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   Pt is scheduled for surgery       Past Surgical History:  Procedure Laterality Date   CESAREAN SECTION  2010   URETHRAL CYST REMOVAL N/A 08/30/2016   Procedure: BARTHOLIN'S GLAND CYST EXCISION;  Surgeon: Ardis Hughs, MD;  Location: [facility];  Service: Urology;  Laterality: N/A;   v back     2012    Family History  Problem Relation Age of Onset   Colon cancer Father 93   Colon polyps Mother    Hypertension Mother    Diabetes Paternal Grandmother    Stroke Paternal Grandmother    Alcohol  abuse Paternal Uncle    Colon cancer Paternal Aunt 29   Esophageal cancer Neg Hx    Rectal cancer Neg Hx    Stomach cancer Neg Hx     Social History   Socioeconomic History   Marital status: Married    Spouse name: Derrick   Number of children: 2   Years of education: Assoc   Highest education level: Not on file  Occupational History   Occupation: CMA    Employer: [employer] strain: Not on file   Food insecurity    Worry: Not on file    Inability: Not on file   Transportation needs    Medical: Not on file    Non-medical: Not on file  Tobacco Use   Smoking status: Never Smoker   Smokeless tobacco: Never Used  Substance and Sexual Activity   Alcohol use: Yes    Comment: ocassional   Drug use: No   Sexual activity: Yes    Partners: Male    Birth control/protection: None  Lifestyle     Physical activity    Days per week: Not on file    Minutes per session: Not on file   Stress: Not on file  Relationships   Social connections    Talks on phone: Not on file    Gets together: Not on file    Attends religious service: Not on file    Active member of club or organization: Not on file    Attends meetings of clubs or organizations: Not on file    Relationship status: Not on file   Intimate partner violence    Fear of current or ex partner: Not on file    Emotionally abused: Not on file    Physically abused: Not on file    Forced sexual activity: Not on file  Other Topics Concern   Not on file  Social History Narrative   Regular exercise. No regular caffeine.    Outpatient Medications Prior to Visit  Medication Sig Dispense Refill   Multiple Vitamins-Minerals (ADULT ONE DAILY GUMMIES PO) Take by mouth daily.     paragard intrauterine copper IUD IUD 1 each by Intrauterine route once.     Facility-Administered Medications Prior to Visit  Medication Dose Route Frequency Provider Last Rate Last Dose   0.9 %  sodium chloride infusion  500 mL Intravenous Once Danis, Estill Cotta III, MD        No Known Allergies  ROS     Objective:    Physical Exam  Constitutional: She is oriented to person, place, and time. She appears well-developed and well-nourished.  HENT:  Head: Normocephalic and atraumatic.  Right Ear: External ear normal.  Left Ear: External ear normal.  Nose: Nose normal.  Mouth/Throat: Oropharynx is clear and moist.  TMs and canals are clear.   Eyes: Pupils are equal, round, and reactive to light. Conjunctivae and EOM are normal.  Neck: Neck supple. No thyromegaly present.  Cardiovascular: Normal rate, regular rhythm and normal heart sounds.  Pulmonary/Chest: Effort normal and breath sounds normal. She has no wheezes.  Musculoskeletal:     Comments: Right side of neck in the posterior triangle she has a general soft fullness and has about 5  palpable LNs. See in circles below. The larges being oval shaped and approx 1 cm in diameter.    Lymphadenopathy:    She has no cervical adenopathy.  Neurological: She is alert and oriented to person,  place, and time.  Skin: Skin is warm and dry.  Psychiatric: She has a normal mood and affect.           BP 117/63    Pulse (!) 125    Temp (!) 97.3 F (36.3 C)    Ht 5\' 5"  (1.651 m)    Wt 195 lb (88.5 kg)    SpO2 100%    BMI 32.45 kg/m  Wt Readings from Last 3 Encounters:  05/14/19 195 lb (88.5 kg)  03/23/19 202 lb (91.6 kg)  03/15/19 202 lb (91.6 kg)    Health Maintenance Due  Topic Date Due   INFLUENZA VACCINE  04/17/2019    There are no preventive care reminders to display for this patient.   Lab Results  Component Value Date   TSH 2.83 07/10/2017   Lab Results  Component Value Date   WBC 7.9 03/15/2019   HGB 8.5 (L) 03/15/2019   HCT 28.6 (L) 03/15/2019   MCV 73.9 (L) 03/15/2019   PLT 471 (H) 03/15/2019   Lab Results  Component Value Date   NA 139 03/15/2019   K 4.3 03/15/2019   CO2 27 03/15/2019   GLUCOSE 91 03/15/2019   BUN 12 03/15/2019   CREATININE 0.58 03/15/2019   BILITOT 0.5 03/15/2019   ALKPHOS 46 08/11/2013   AST 10 03/15/2019   ALT 10 03/15/2019   PROT 7.6 03/15/2019   ALBUMIN 4.0 08/11/2013   CALCIUM 8.9 03/15/2019   Lab Results  Component Value Date   CHOL 152 03/15/2019   Lab Results  Component Value Date   HDL 44 (L) 03/15/2019   Lab Results  Component Value Date   LDLCALC 95 03/15/2019   Lab Results  Component Value Date   TRIG 44 03/15/2019   Lab Results  Component Value Date   CHOLHDL 3.5 03/15/2019   No results found for: HGBA1C     Assessment & Plan:   Problem List Items Addressed This Visit    None    Visit Diagnoses    Microcytic anemia    -  Primary   Relevant Orders   CBC with Differential/Platelet   COMPLETE METABOLIC PANEL WITH GFR   Fe+TIBC+Fer   B12   US Soft Tissue Head/Neck (Completed)    Sedimentation rate   Urine Microalbumin w/creat. ratio   Pathologist smear review   Need for immunization against influenza       Relevant Orders   Flu Vaccine QUAD 36+ mos IM (Completed)   Neck mass       Relevant Orders   CBC with Differential/Platelet   COMPLETE METABOLIC PANEL WITH GFR   Fe+TIBC+Fer   B12   US Soft Tissue Head/Neck (Completed)   Sedimentation rate   Urine Microalbumin w/creat. ratio   Pathologist smear review     Neck mass-unclear etiology.  Based on the location of the posterior triangle I am quite concerned about the possibility of cancer so I do want to work this up more aggressively.  Certainly she has been having some persistent sinus and allergy symptoms for the last couple of months which could be contributing and causing some inflammation the lymph nodes. Going to get her scheduled for an ultrasound this afternoon.  We will do some additional blood work including a CBC and smear.  Will call with results as soon as available.  Depending on what we find related to consider further imaging such as CT or MRI.  If the nodes look more supraclavicular then  consider thoracic and abdominal work-up as well if not then consider further imaging of the head if needed.  Okay to use Tylenol if needed for pain.  Follow if develops any new symptoms.  Will monitor weight carefully.  Microcytic anemia-could certainly be secondary to prolonged menses.  And she was mildly anemic a year ago.  Continue with daily iron supplement and will recheck CBC and iron levels today.  No orders of the defined types were placed in this encounter.    Beatrice Lecher, MD

## 2019-05-17 ENCOUNTER — Encounter: Payer: Self-pay | Admitting: Family Medicine

## 2019-05-17 ENCOUNTER — Other Ambulatory Visit: Payer: Self-pay

## 2019-05-17 ENCOUNTER — Ambulatory Visit (HOSPITAL_COMMUNITY)
Admission: RE | Admit: 2019-05-17 | Discharge: 2019-05-17 | Disposition: A | Discharge status: Home / Self Care | Payer: No Typology Code available for payment source | Source: Ambulatory Visit | Attending: Family Medicine | Admitting: Family Medicine

## 2019-05-17 DIAGNOSIS — I889 Nonspecific lymphadenitis, unspecified: Secondary | ICD-10-CM | POA: Insufficient documentation

## 2019-05-17 DIAGNOSIS — R221 Localized swelling, mass and lump, neck: Secondary | ICD-10-CM | POA: Insufficient documentation

## 2019-05-17 LAB — CBC WITH DIFFERENTIAL/PLATELET
Absolute Monocytes: 574 cells/uL (ref 200–950)
Basophils Absolute: 40 cells/uL (ref 0–200)
Basophils Relative: 0.4 %
Eosinophils Absolute: 257 cells/uL (ref 15–500)
Eosinophils Relative: 2.6 %
HCT: 28.9 % — ABNORMAL LOW (ref 35.0–45.0)
Hemoglobin: 8.7 g/dL — ABNORMAL LOW (ref 11.7–15.5)
Lymphs Abs: 1861 cells/uL (ref 850–3900)
MCH: 21.7 pg — ABNORMAL LOW (ref 27.0–33.0)
MCHC: 30.1 g/dL — ABNORMAL LOW (ref 32.0–36.0)
MCV: 72.1 fL — ABNORMAL LOW (ref 80.0–100.0)
MPV: 9.4 fL (ref 7.5–12.5)
Monocytes Relative: 5.8 %
Neutro Abs: 7168 cells/uL (ref 1500–7800)
Neutrophils Relative %: 72.4 %
Platelets: 511 10*3/uL — ABNORMAL HIGH (ref 140–400)
RBC: 4.01 10*6/uL (ref 3.80–5.10)
RDW: 18.3 % — ABNORMAL HIGH (ref 11.0–15.0)
Total Lymphocyte: 18.8 %
WBC: 9.9 10*3/uL (ref 3.8–10.8)

## 2019-05-17 LAB — IRON,TIBC AND FERRITIN PANEL
%SAT: 4 % (calc) — ABNORMAL LOW (ref 16–45)
Ferritin: 50 ng/mL (ref 16–154)
Iron: 11 ug/dL — ABNORMAL LOW (ref 40–190)
TIBC: 258 mcg/dL (calc) (ref 250–450)

## 2019-05-17 LAB — COMPLETE METABOLIC PANEL WITH GFR
AG Ratio: 0.7 (calc) — ABNORMAL LOW (ref 1.0–2.5)
ALT: 9 U/L (ref 6–29)
AST: 11 U/L (ref 10–30)
Albumin: 3.5 g/dL — ABNORMAL LOW (ref 3.6–5.1)
Alkaline phosphatase (APISO): 71 U/L (ref 31–125)
BUN: 7 mg/dL (ref 7–25)
CO2: 28 mmol/L (ref 20–32)
Calcium: 9.3 mg/dL (ref 8.6–10.2)
Chloride: 101 mmol/L (ref 98–110)
Creat: 0.66 mg/dL (ref 0.50–1.10)
GFR, Est African American: 131 mL/min/{1.73_m2} (ref >=60)
GFR, Est Non African American: 113 mL/min/{1.73_m2} (ref >=60)
Globulin: 4.7 g/dL (calc) — ABNORMAL HIGH (ref 1.9–3.7)
Glucose, Bld: 108 mg/dL — ABNORMAL HIGH (ref 65–99)
Potassium: 4.4 mmol/L (ref 3.5–5.3)
Sodium: 139 mmol/L (ref 135–146)
Total Bilirubin: 0.8 mg/dL (ref 0.2–1.2)
Total Protein: 8.2 g/dL — ABNORMAL HIGH (ref 6.1–8.1)

## 2019-05-17 LAB — MICROALBUMIN / CREATININE URINE RATIO
Creatinine, Urine: 87 mg/dL (ref 20–275)
Microalb Creat Ratio: 3 mcg/mg creat (ref <30)
Microalb, Ur: 0.3 mg/dL

## 2019-05-17 LAB — VITAMIN B12: Vitamin B-12: 1087 pg/mL (ref 200–1100)

## 2019-05-17 LAB — PATHOLOGIST SMEAR REVIEW

## 2019-05-17 LAB — SEDIMENTATION RATE: Sed Rate: 116 mm/h — ABNORMAL HIGH (ref 0–20)

## 2019-05-17 MED ORDER — IOHEXOL 300 MG/ML  SOLN
75.0000 mL | Freq: Once | INTRAMUSCULAR | Status: AC | PRN
  Administered 2019-05-17: 16:00:00 75 mL via INTRAVENOUS

## 2019-05-17 MED ORDER — SODIUM CHLORIDE (PF) 0.9 % IJ SOLN
INTRAMUSCULAR | Status: AC
  Filled 2019-05-17: qty 50

## 2019-05-17 NOTE — Progress Notes (Unsigned)
Pt prefers [place], [place] appt  Ordered STAT per Dr Madilyn Fireman

## 2019-05-17 NOTE — Addendum Note (Signed)
Addended by: Beatrice Lecher D on: 05/17/2019 04:26 PM   Modules accepted: Orders

## 2019-05-17 NOTE — Telephone Encounter (Signed)
Patient states in msg that she would be okay with [place] but [facility] location is fine. She spoke with her boss and states that the later in the afternoon for the scan, the better.

## 2019-05-18 ENCOUNTER — Other Ambulatory Visit: Payer: Self-pay | Admitting: General Surgery

## 2019-05-18 ENCOUNTER — Other Ambulatory Visit (HOSPITAL_COMMUNITY): Payer: Self-pay | Admitting: General Surgery

## 2019-05-18 DIAGNOSIS — R590 Localized enlarged lymph nodes: Secondary | ICD-10-CM

## 2019-05-19 ENCOUNTER — Ambulatory Visit (HOSPITAL_COMMUNITY)
Admission: RE | Admit: 2019-05-19 | Discharge: 2019-05-19 | Disposition: A | Discharge status: Home / Self Care | Payer: No Typology Code available for payment source | Source: Ambulatory Visit | Attending: General Surgery | Admitting: General Surgery

## 2019-05-19 ENCOUNTER — Telehealth: Payer: Self-pay | Admitting: Hematology

## 2019-05-19 ENCOUNTER — Other Ambulatory Visit: Payer: Self-pay

## 2019-05-19 ENCOUNTER — Other Ambulatory Visit (HOSPITAL_COMMUNITY)
Admission: RE | Admit: 2019-05-19 | Discharge: 2019-05-19 | Disposition: A | Discharge status: Home / Self Care | Payer: No Typology Code available for payment source | Source: Ambulatory Visit | Attending: General Surgery | Admitting: General Surgery

## 2019-05-19 DIAGNOSIS — R590 Localized enlarged lymph nodes: Secondary | ICD-10-CM | POA: Diagnosis not present

## 2019-05-19 LAB — SARS CORONAVIRUS 2 (TAT 6-24 HRS): SARS Coronavirus 2: NEGATIVE

## 2019-05-19 MED ORDER — IOHEXOL 300 MG/ML  SOLN
100.0000 mL | Freq: Once | INTRAMUSCULAR | Status: AC | PRN
  Administered 2019-05-19: 100 mL via INTRAVENOUS

## 2019-05-19 MED ORDER — SODIUM CHLORIDE (PF) 0.9 % IJ SOLN
INTRAMUSCULAR | Status: AC
  Filled 2019-05-19: qty 50

## 2019-05-19 NOTE — Pre-Procedure Instructions (Signed)
[place]  05/19/2019      [place], [place] [address] Phone: [phone] Fax: [phone]    Your procedure is scheduled on May 21, 2019.  Report to [facility] Admitting at 815 AM.  Call this number if you have problems the morning of surgery:  [phone]   Remember:  Do not eat after midnight.  You may drink clear liquids until 700 AM.  Clear liquids allowed are:       Water, Juice (non-citric and without pulp), Clear Tea, Black Coffee only and Gatorade    Take these medicines the morning of surgery with A SIP OF WATER NONE  7 days prior to surgery STOP taking any Aspirin (unless otherwise instructed by your surgeon), Aleve, Naproxen, Ibuprofen, Motrin, Advil, Goody's, BC's, all herbal medications, fish oil, and all vitamins   Day of surgery:  Do not wear jewelry, make-up or nail polish.  Do not wear lotions, powders, or perfumes, or deodorant.  Do not shave 48 hours prior to surgery.    Do not bring valuables to the hospital.  [facility] is not responsible for any belongings or valuables.  Contacts, dentures or bridgework may not be worn into surgery.  Leave your suitcase in the car.  After surgery it may be brought to your room.  For patients admitted to the hospital, discharge time will be determined by your treatment team.  Patients discharged the day of surgery will not be allowed to drive home.    [place]- Preparing For Surgery  Before surgery, you can play an important role. Because skin is not sterile, your skin needs to be as free of germs as possible. You can reduce the number of germs on your skin by washing with CHG (chlorahexidine gluconate) Soap before surgery.  CHG is an antiseptic cleaner which kills germs and bonds with the skin to continue killing germs even after washing.    Oral Hygiene is also important to reduce your risk of  infection.  Remember - BRUSH YOUR TEETH THE MORNING OF SURGERY WITH YOUR REGULAR TOOTHPASTE  Please do not use if you have an allergy to CHG or antibacterial soaps. If your skin becomes reddened/irritated stop using the CHG.  Do not shave (including legs and underarms) for at least 48 hours prior to first CHG shower. It is OK to shave your face.  Please follow these instructions carefully.   1. Shower the NIGHT BEFORE SURGERY and the MORNING OF SURGERY with CHG.   2. If you chose to wash your hair, wash your hair first as usual with your normal shampoo.  3. After you shampoo, rinse your hair and body thoroughly to remove the shampoo.  4. Use CHG as you would any other liquid soap. You can apply CHG directly to the skin and wash gently with a scrungie or a clean washcloth.   5. Apply the CHG Soap to your body ONLY FROM THE NECK DOWN.  Do not use on open wounds or open sores. Avoid contact with your eyes, ears, mouth and genitals (private parts). Wash Face and genitals (private parts)  with your normal soap.  6. Wash thoroughly, paying special attention to the area where your surgery will be performed.  7. Thoroughly rinse your body with warm water from the neck down.  8. DO NOT shower/wash with your normal soap after using and rinsing off the CHG  Soap.  9. Pat yourself dry with a CLEAN TOWEL.  10. Wear CLEAN PAJAMAS to bed the night before surgery, wear comfortable clothes the morning of surgery  11. Place CLEAN SHEETS on your bed the night of your first shower and DO NOT SLEEP WITH PETS.  Day of Surgery: Shower as above Do not apply any deodorants/lotions.  Please wear clean clothes to the hospital/surgery center.   Remember to brush your teeth WITH YOUR REGULAR TOOTHPASTE.   Please read over the following fact sheets that you were given.

## 2019-05-19 NOTE — H&P (Signed)
Elizabeth Beasley Location: [facility] Patient #: [id number] DOB: [birth year] Married / Language: English / Race: Black or African American Female      History of Present Illness       This is a very pleasant 38 year old female, referred by [name] for evaluation of lymphadenopathy of the neck, possible lymphoma. The patient noticed a lump in her right neck 2 or 3 weeks ago. It is not tender. She's had some night sweats and she's had about a 7 pound weight loss. No swallowing problems. No pulmonary problems. No gastrointestinal problems. She is also has a microcytic anemia and has been started on iron. She is a Sales promotion account executive [employer]      She had an ultrasound of the neck and then a CT scan of the neck the CT scan shows bilateral cervical lymphadenopathy. Larger on the right. There is a 3.0 cm and a 3.5 cm node on the right level IV and 5. Multiple smaller but additional abnormal lymph nodes. Left neck lymph nodes are present and levels 3 and 4 and supraclavicular. Mediastinal lymphadenopathies partially visualized with bulky right paratracheal nodes. Nothing else was noted in the neck or upper chest. This is concerning for lymphoma from a radiologist standpoint.      Family history is negative for multiple endocrine neoplasias. 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 She is married with 2 daughters. Denies tobacco. Drinks alcohol occasionally. 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.  Plan: We had a long discussion. I told her that the most likely possibility here was some type of lymphoma possibly Hodgkin's disease. She is anxious to proceed with biopsy which is the next step She will be scheduled for excisional biopsy deep right cervical lymph node, posterior triangle as soon as possible. We'll try to add this on the schedule this week. I discussed the indications, details  techniques, and risk of the surgery with her. She's where the risk of bleeding, infection, injury to adjacent organs. Rare palpitation of spinal accessory nerve injury with permanent disability of shoulder and trapezius muscle. She understands all these issues. All her questions were answered. She agrees with this plan. We are also going to go ahead and take the liberty of ordering a CT scan chest abdomen and pelvis which will be necessary as part of her workup Dr. Madilyn Fireman has already made a referral to medical oncology.    Past Surgical History  Cesarean Section - 1   Diagnostic Studies History  Colonoscopy  within last year Mammogram  1-3 years ago Pap Smear  never  Allergies No Known Allergies  No Known Drug Allergies Allergies Reconciled   Medication History  No Current Medications Medications Reconciled  Social History  Caffeine use  Carbonated beverages. No alcohol use  No drug use  Tobacco use  Never smoker.  Family History  Colon Cancer  Father. Colon Polyps  Mother. Diabetes Mellitus  Mother. Hypertension  Mother.  Pregnancy / Birth History  Age at menarche  73 years. Gravida  2 Length (months) of breastfeeding  3-6 Maternal age  7-30 Para  2 Regular periods     Review of Systems General Present- Fatigue. Not Present- Appetite Loss, Chills, Fever, Night Sweats, Weight Gain and Weight Loss. HEENT Present- Hoarseness. Not Present- Earache, Hearing Loss, Nose Bleed, Oral Ulcers, Ringing in the Ears, Seasonal Allergies, Sinus Pain, Sore Throat, Visual Disturbances, Wears glasses/contact lenses and Yellow Eyes. Respiratory  Not Present- Bloody sputum, Chronic Cough, Difficulty Breathing, Snoring and Wheezing. Cardiovascular Not Present- Chest Pain, Difficulty Breathing Lying Down, Leg Cramps, Palpitations, Rapid Heart Rate, Shortness of Breath and Swelling of Extremities. Gastrointestinal Present- Bloating. Not Present- Abdominal  Pain, Bloody Stool, Change in Bowel Habits, Chronic diarrhea, Constipation, Difficulty Swallowing, Excessive gas, Gets full quickly at meals, Hemorrhoids, Indigestion, Nausea, Rectal Pain and Vomiting. Female Genitourinary Not Present- Frequency, Nocturia, Painful Urination, Pelvic Pain and Urgency. Musculoskeletal Not Present- Back Pain, Joint Pain, Joint Stiffness, Muscle Pain, Muscle Weakness and Swelling of Extremities. Neurological Not Present- Decreased Memory, Fainting, Headaches, Numbness, Seizures, Tingling, Tremor, Trouble walking and Weakness. Psychiatric Not Present- Anxiety, Bipolar, Change in Sleep Pattern, Depression, Fearful and Frequent crying. Hematology Present- Gland problems. Not Present- Blood Thinners, Easy Bruising, Excessive bleeding, HIV and Persistent Infections.  Vitals  Weight: 195.6 lb Height: 65in Body Surface Area: 1.96 m Body Mass Index: 32.55 kg/m  Temp.: 96.6F (Temporal)  Pulse: 128 (Regular)  BP: 150/84(Sitting, Left Arm, Standard)    Physical Exam  General Mental Status-Alert. General Appearance-Consistent with stated age. Hydration-Well hydrated. Voice-Normal.  Head and Neck Head-normocephalic, atraumatic with no lesions or palpable masses. Trachea-midline. Thyroid Gland Characteristics - normal size and consistency.  Eye Eyeball - Bilateral-Extraocular movements intact. Sclera/Conjunctiva - Bilateral-No scleral icterus.  Chest and Lung Exam Chest and lung exam reveals -quiet, even and easy respiratory effort with no use of accessory muscles and on auscultation, normal breath sounds, no adventitious sounds and normal vocal resonance. Inspection Chest Wall - Normal. Back - normal.  Cardiovascular Cardiovascular examination reveals -normal heart sounds, regular rate and rhythm with no murmurs and normal pedal pulses bilaterally.  Abdomen Inspection Inspection of the abdomen reveals - No  Hernias. Palpation/Percussion Palpation and Percussion of the abdomen reveal - Soft, Non Tender, No Rebound tenderness, No Rigidity (guarding) and No hepatosplenomegaly. Auscultation Auscultation of the abdomen reveals - Bowel sounds normal.  Neurologic Neurologic evaluation reveals -alert and oriented x 3 with no impairment of recent or remote memory. Mental Status-Normal.  Musculoskeletal Normal Exam - Left-Upper Extremity Strength Normal and Lower Extremity Strength Normal. Normal Exam - Right-Upper Extremity Strength Normal and Lower Extremity Strength Normal.  Lymphatic Note: Bulky but soft, somewhat mobile lymphadenopathy most notable in the right neck posterior triangle. Small nodes on the left. No suboccipital nodes. Borderline axillary adenopathy. No inguinal adenopathy. Spleen and liver did not appear enlarged.     Assessment & Plan   LYMPHADENOPATHY, CERVICAL (R59.0)  You have recently noticed enlarged lymph nodes in your right neck I am also able to feel these in the right neck, posterior triangle your CT scan shows enlarged lymph nodes in the right neck and to a lesser extent the left neck. There also appears to be some lymph nodes that are enlarged in the chest  The most likely cause for this is some type of lymphoma, possibly Hodgkin's disease you will be scheduled in the near future for excision right neck lymph nodes under general anesthesia you will be able to go home the same day We have discussed the indications, techniques, and risk of the surgery in detail  you will also be scheduled for CT scan of the chest abdomen and pelvis to see if there are any other enlarged lymph nodes  Dr. Madilyn Fireman has referred you to medical oncology as well, which is appropriate   MICROCYTIC ANEMIA (D50.9)  BMI 32.0-32.9,ADULT (Z68.32)  HISTORY OF C-SECTION [id number])    Edsel Petrin. Dalbert Batman, M.D., [facility] General and Minimally invasive  Surgery Breast and Colorectal Surgery Office:   [phone] Pager:   [phone]

## 2019-05-19 NOTE — Telephone Encounter (Signed)
Received a new patient referral from Dr. Madilyn Fireman for microcytic anemia/cervical lymphadenopathy. Elizabeth Beasley has been cld and scheduled to see Dr. Irene Limbo on 9/15 at 11am. She's aware to arrive 15 minutes early.

## 2019-05-20 ENCOUNTER — Encounter (HOSPITAL_COMMUNITY)
Admission: RE | Admit: 2019-05-20 | Discharge: 2019-05-20 | Disposition: A | Discharge status: Home / Self Care | Payer: No Typology Code available for payment source | Source: Ambulatory Visit | Attending: General Surgery | Admitting: General Surgery

## 2019-05-20 ENCOUNTER — Encounter (HOSPITAL_COMMUNITY): Payer: Self-pay

## 2019-05-20 ENCOUNTER — Other Ambulatory Visit: Payer: Self-pay

## 2019-05-20 DIAGNOSIS — C8591 Non-Hodgkin lymphoma, unspecified, lymph nodes of head, face, and neck: Secondary | ICD-10-CM | POA: Diagnosis not present

## 2019-05-20 DIAGNOSIS — R590 Localized enlarged lymph nodes: Secondary | ICD-10-CM | POA: Diagnosis present

## 2019-05-20 DIAGNOSIS — D509 Iron deficiency anemia, unspecified: Secondary | ICD-10-CM | POA: Diagnosis not present

## 2019-05-20 HISTORY — DX: Allergy status to unspecified drugs, medicaments and biological substances: Z88.9

## 2019-05-20 LAB — CBC WITH DIFFERENTIAL/PLATELET
Abs Immature Granulocytes: 0.03 10*3/uL (ref 0.00–0.07)
Basophils Absolute: 0 10*3/uL (ref 0.0–0.1)
Basophils Relative: 0 %
Eosinophils Absolute: 0.2 10*3/uL (ref 0.0–0.5)
Eosinophils Relative: 2 %
HCT: 31.8 % — ABNORMAL LOW (ref 36.0–46.0)
Hemoglobin: 9.2 g/dL — ABNORMAL LOW (ref 12.0–15.0)
Immature Granulocytes: 0 %
Lymphocytes Relative: 21 %
Lymphs Abs: 1.9 10*3/uL (ref 0.7–4.0)
MCH: 21.8 pg — ABNORMAL LOW (ref 26.0–34.0)
MCHC: 28.9 g/dL — ABNORMAL LOW (ref 30.0–36.0)
MCV: 75.4 fL — ABNORMAL LOW (ref 80.0–100.0)
Monocytes Absolute: 0.5 10*3/uL (ref 0.1–1.0)
Monocytes Relative: 6 %
Neutro Abs: 6.3 10*3/uL (ref 1.7–7.7)
Neutrophils Relative %: 71 %
Platelets: 547 10*3/uL — ABNORMAL HIGH (ref 150–400)
RBC: 4.22 MIL/uL (ref 3.87–5.11)
RDW: 20.1 % — ABNORMAL HIGH (ref 11.5–15.5)
WBC: 9 10*3/uL (ref 4.0–10.5)
nRBC: 0 % (ref 0.0–0.2)

## 2019-05-20 LAB — BASIC METABOLIC PANEL
Anion gap: 12 (ref 5–15)
BUN: 6 mg/dL (ref 6–20)
CO2: 24 mmol/L (ref 22–32)
Calcium: 9.2 mg/dL (ref 8.9–10.3)
Chloride: 101 mmol/L (ref 98–111)
Creatinine, Ser: 0.67 mg/dL (ref 0.44–1.00)
GFR calc Af Amer: >60 mL/min (ref >60)
GFR calc non Af Amer: >60 mL/min (ref >60)
Glucose, Bld: 88 mg/dL (ref 70–99)
Potassium: 3.4 mmol/L — ABNORMAL LOW (ref 3.5–5.1)
Sodium: 137 mmol/L (ref 135–145)

## 2019-05-20 LAB — PROTIME-INR
INR: 1.2 (ref 0.8–1.2)
Prothrombin Time: 15.1 seconds (ref 11.4–15.2)

## 2019-05-20 LAB — APTT: aPTT: 29 seconds (ref 24–36)

## 2019-05-20 NOTE — Pre-Procedure Instructions (Signed)
[place]  05/20/2019      [place], [place] [address] Phone: [phone] Fax: [phone]    Your procedure is scheduled on May 21, 2019.  Report to [facility] Admitting at 815 AM.  Call this number if you have problems the morning of surgery:  [phone]   Remember:  Do not eat after midnight.  You may drink clear liquids until 700 AM.  Clear liquids allowed are:       Water, Juice (non-citric and without pulp), Clear Tea, Black Coffee only and Gatorade    Take these medicines the morning of surgery with A SIP OF WATER NONE  7 days prior to surgery STOP taking any Aspirin (unless otherwise instructed by your surgeon), Aleve, Naproxen, Ibuprofen, Motrin, Advil, Goody's, BC's, all herbal medications, fish oil, and all vitamins.  [facility]- Preparing For Surgery  Before surgery, you can play an important role. Because skin is not sterile, your skin needs to be as free of germs as possible. You can reduce the number of germs on your skin by washing with CHG (chlorahexidine gluconate) Soap before surgery.  CHG is an antiseptic cleaner which kills germs and bonds with the skin to continue killing germs even after washing.    Oral Hygiene is also important to reduce your risk of infection.  Remember - BRUSH YOUR TEETH THE MORNING OF SURGERY WITH YOUR REGULAR TOOTHPASTE  Please do not use if you have an allergy to CHG or antibacterial soaps. If your skin becomes reddened/irritated stop using the CHG.  Do not shave (including legs and underarms) for at least 48 hours prior to first CHG shower. It is OK to shave your face.  Please follow these instructions carefully.   1. Shower the NIGHT BEFORE SURGERY and the MORNING OF SURGERY with CHG.   2. If you chose to wash your hair, wash your hair first as usual with your normal shampoo.  3. After you shampoo, wash your  face and private area with the soap you use at home, then rinse your hair and body thoroughly to remove the shampoo and soap.   4. Use CHG as you would any other liquid soap. You can apply CHG directly to the skin and wash gently with a scrungie or a clean washcloth.   5. Apply the CHG Soap to your body ONLY FROM THE NECK DOWN.  Do not use on open wounds or open sores. Avoid contact with your eyes, ears, mouth and genitals (private parts).   6. Wash thoroughly, paying special attention to the area where your surgery will be performed.  7. Thoroughly rinse your body with warm water from the neck down.  8. DO NOT shower/wash with your normal soap after using and rinsing off the CHG Soap.  9. Pat yourself dry with a CLEAN TOWEL.  10. Wear CLEAN PAJAMAS to bed the night before surgery, wear comfortable clothes the morning of surgery  11. Place CLEAN SHEETS on your bed the night of your first shower and DO NOT SLEEP WITH PETS.  Day of Surgery: Shower as above Do not apply any deodorants/lotions, powders, colognes. Please wear clean clothes to the hospital/surgery center.   Remember to brush your teeth WITH YOUR REGULAR TOOTHPASTE.  Day of surgery:  Do not wear jewelry, make-up or nail polish.  Do not shave 48 hours prior to surgery.    Do  not bring valuables to the hospital.  [facility] is not responsible for any belongings or valuables.  Contacts, dentures or bridgework may not be worn into surgery.  Leave your suitcase in the car.  After surgery it may be brought to your room.  For patients admitted to the hospital, discharge time will be determined by your treatment team.  Patients discharged the day of surgery will not be allowed to drive home.   Please read over the following fact sheets that you were given.

## 2019-05-20 NOTE — Progress Notes (Signed)
PCP - Dr Kandace Blitz  Cardiologist - no  Chest x-ray - na  EKG - na  Stress Test - no  ECHO - no  Cardiac Cath - no  Sleep Study - no CPAP - no  LABS-CBC, BMP, PT, PTT UA Preg day of surgery  ASA-no  ERAS-yes- no Pre- surgery beverage  HA1C-na Fasting Blood Sugar - na Checks Blood Sugar __0___ times a day  Anesthesia-  Pt denies having chest pain, sob, or fever at this time. All instructions explained to the pt, with a verbal understanding of the material. Pt agrees to go over the instructions while at home for a better understanding. Pt also instructed to self quarantine after being tested for COVID-19. The opportunity to ask questions was provided.

## 2019-05-21 ENCOUNTER — Ambulatory Visit (HOSPITAL_COMMUNITY): Payer: No Typology Code available for payment source | Admitting: Certified Registered"

## 2019-05-21 ENCOUNTER — Other Ambulatory Visit: Payer: Self-pay

## 2019-05-21 ENCOUNTER — Encounter (HOSPITAL_COMMUNITY)
Admission: RE | Disposition: A | Discharge status: Home / Self Care | Payer: Self-pay | Source: Home / Self Care | Attending: General Surgery

## 2019-05-21 ENCOUNTER — Ambulatory Visit (HOSPITAL_COMMUNITY)
Admission: RE | Admit: 2019-05-21 | Discharge: 2019-05-21 | Disposition: A | Discharge status: Home / Self Care | Payer: No Typology Code available for payment source | Attending: General Surgery | Admitting: General Surgery

## 2019-05-21 ENCOUNTER — Encounter (HOSPITAL_COMMUNITY): Payer: Self-pay | Admitting: *Deleted

## 2019-05-21 DIAGNOSIS — C8591 Non-Hodgkin lymphoma, unspecified, lymph nodes of head, face, and neck: Secondary | ICD-10-CM | POA: Insufficient documentation

## 2019-05-21 DIAGNOSIS — R590 Localized enlarged lymph nodes: Secondary | ICD-10-CM | POA: Diagnosis present

## 2019-05-21 DIAGNOSIS — D509 Iron deficiency anemia, unspecified: Secondary | ICD-10-CM | POA: Insufficient documentation

## 2019-05-21 HISTORY — PX: MASS EXCISION: SHX2000

## 2019-05-21 HISTORY — DX: Localized enlarged lymph nodes: R59.0

## 2019-05-21 LAB — POCT PREGNANCY, URINE: Preg Test, Ur: NEGATIVE

## 2019-05-21 SURGERY — EXCISION MASS
Anesthesia: General | Site: Neck | Laterality: Right

## 2019-05-21 MED ORDER — MIDAZOLAM HCL 5 MG/5ML IJ SOLN
INTRAMUSCULAR | Status: DC | PRN
  Administered 2019-05-21: 2 mg via INTRAVENOUS

## 2019-05-21 MED ORDER — PROPOFOL 10 MG/ML IV BOLUS
INTRAVENOUS | Status: DC | PRN
  Administered 2019-05-21: 200 mg via INTRAVENOUS
  Administered 2019-05-21 (×2): 50 mg via INTRAVENOUS

## 2019-05-21 MED ORDER — SODIUM CHLORIDE 0.9 % IV SOLN
INTRAVENOUS | Status: DC | PRN
  Administered 2019-05-21: 30 ug/min via INTRAVENOUS

## 2019-05-21 MED ORDER — MIDAZOLAM HCL 2 MG/2ML IJ SOLN
INTRAMUSCULAR | Status: AC
  Filled 2019-05-21: qty 2

## 2019-05-21 MED ORDER — LACTATED RINGERS IV SOLN
INTRAVENOUS | Status: DC
  Administered 2019-05-21: 09:00:00 via INTRAVENOUS

## 2019-05-21 MED ORDER — LIDOCAINE IN D5W 4-5 MG/ML-% IV SOLN
1.0000 mg/min | INTRAVENOUS | Status: DC
  Filled 2019-05-21: qty 500

## 2019-05-21 MED ORDER — 0.9 % SODIUM CHLORIDE (POUR BTL) OPTIME
TOPICAL | Status: DC | PRN
  Administered 2019-05-21: 1000 mL

## 2019-05-21 MED ORDER — FENTANYL CITRATE (PF) 250 MCG/5ML IJ SOLN
INTRAMUSCULAR | Status: AC
  Filled 2019-05-21: qty 5

## 2019-05-21 MED ORDER — CELECOXIB 200 MG PO CAPS
200.0000 mg | ORAL_CAPSULE | ORAL | Status: AC
  Administered 2019-05-21: 200 mg via ORAL
  Filled 2019-05-21: qty 1

## 2019-05-21 MED ORDER — ONDANSETRON HCL 4 MG/2ML IJ SOLN
INTRAMUSCULAR | Status: AC
  Filled 2019-05-21: qty 2

## 2019-05-21 MED ORDER — LIDOCAINE 2% (20 MG/ML) 5 ML SYRINGE
INTRAMUSCULAR | Status: DC | PRN
  Administered 2019-05-21: 80 mg via INTRAVENOUS

## 2019-05-21 MED ORDER — LIDOCAINE-EPINEPHRINE 1 %-1:100000 IJ SOLN
INTRAMUSCULAR | Status: AC
  Filled 2019-05-21: qty 1

## 2019-05-21 MED ORDER — CEFAZOLIN SODIUM-DEXTROSE 2-4 GM/100ML-% IV SOLN
2.0000 g | INTRAVENOUS | Status: AC
  Administered 2019-05-21: 2 g via INTRAVENOUS
  Filled 2019-05-21: qty 100

## 2019-05-21 MED ORDER — LIDOCAINE-EPINEPHRINE (PF) 1 %-1:200000 IJ SOLN
INTRAMUSCULAR | Status: DC | PRN
  Administered 2019-05-21: 7 mL

## 2019-05-21 MED ORDER — LIDOCAINE 2% (20 MG/ML) 5 ML SYRINGE
INTRAMUSCULAR | Status: AC
  Filled 2019-05-21: qty 5

## 2019-05-21 MED ORDER — DEXAMETHASONE SODIUM PHOSPHATE 10 MG/ML IJ SOLN
INTRAMUSCULAR | Status: AC
  Filled 2019-05-21: qty 1

## 2019-05-21 MED ORDER — LACTATED RINGERS IV SOLN
INTRAVENOUS | Status: DC | PRN
  Administered 2019-05-21 (×2): via INTRAVENOUS

## 2019-05-21 MED ORDER — DEXAMETHASONE SODIUM PHOSPHATE 10 MG/ML IJ SOLN
INTRAMUSCULAR | Status: DC | PRN
  Administered 2019-05-21: 10 mg via INTRAVENOUS

## 2019-05-21 MED ORDER — PROPOFOL 10 MG/ML IV BOLUS
INTRAVENOUS | Status: AC
  Filled 2019-05-21: qty 20

## 2019-05-21 MED ORDER — HEMOSTATIC AGENTS (NO CHARGE) OPTIME
TOPICAL | Status: DC | PRN
  Administered 2019-05-21: 1

## 2019-05-21 MED ORDER — LIDOCAINE 2% (20 MG/ML) 5 ML SYRINGE
INTRAMUSCULAR | Status: AC
  Filled 2019-05-21: qty 10

## 2019-05-21 MED ORDER — LIDOCAINE IN D5W 4-5 MG/ML-% IV SOLN
INTRAVENOUS | Status: DC | PRN
  Administered 2019-05-21: 25 ug/kg/min via INTRAVENOUS

## 2019-05-21 MED ORDER — ACETAMINOPHEN 500 MG PO TABS
1000.0000 mg | ORAL_TABLET | ORAL | Status: AC
  Administered 2019-05-21: 1000 mg via ORAL
  Filled 2019-05-21: qty 2

## 2019-05-21 MED ORDER — ROCURONIUM BROMIDE 10 MG/ML (PF) SYRINGE
PREFILLED_SYRINGE | INTRAVENOUS | Status: AC
  Filled 2019-05-21: qty 10

## 2019-05-21 MED ORDER — ROCURONIUM BROMIDE 10 MG/ML (PF) SYRINGE
PREFILLED_SYRINGE | INTRAVENOUS | Status: DC | PRN
  Administered 2019-05-21: 50 mg via INTRAVENOUS

## 2019-05-21 MED ORDER — ONDANSETRON HCL 4 MG/2ML IJ SOLN: 4.0000 mg | Freq: Once | INTRAMUSCULAR | Status: DC | PRN

## 2019-05-21 MED ORDER — CHLORHEXIDINE GLUCONATE CLOTH 2 % EX PADS: 6.0000 | MEDICATED_PAD | Freq: Once | CUTANEOUS | Status: DC

## 2019-05-21 MED ORDER — FENTANYL CITRATE (PF) 100 MCG/2ML IJ SOLN: 25.0000 ug | INTRAMUSCULAR | Status: DC | PRN

## 2019-05-21 MED ORDER — FENTANYL CITRATE (PF) 100 MCG/2ML IJ SOLN
INTRAMUSCULAR | Status: DC | PRN
  Administered 2019-05-21: 50 ug via INTRAVENOUS
  Administered 2019-05-21 (×2): 100 ug via INTRAVENOUS

## 2019-05-21 MED ORDER — SODIUM CHLORIDE 0.9% FLUSH: 3.0000 mL | Freq: Two times a day (BID) | INTRAVENOUS | Status: DC

## 2019-05-21 MED ORDER — HYDROCODONE-ACETAMINOPHEN 5-325 MG PO TABS: 1.0000 | ORAL_TABLET | Freq: Four times a day (QID) | ORAL | 0 refills | Status: DC | PRN

## 2019-05-21 MED ORDER — GABAPENTIN 300 MG PO CAPS
300.0000 mg | ORAL_CAPSULE | ORAL | Status: AC
  Administered 2019-05-21: 09:00:00 300 mg via ORAL
  Filled 2019-05-21: qty 1

## 2019-05-21 MED ORDER — SUGAMMADEX SODIUM 200 MG/2ML IV SOLN
INTRAVENOUS | Status: DC | PRN
  Administered 2019-05-21: 180 mg via INTRAVENOUS

## 2019-05-21 MED FILL — HYDROCODON-APAP 5-325: 5-325 | 2 days supply | Qty: 20 | Fill #0

## 2019-05-21 SURGICAL SUPPLY — 43 items
ADH SKN CLS LQ APL DERMABOND (GAUZE/BANDAGES/DRESSINGS) ×1
APL PRP STRL LF DISP 70% ISPRP (MISCELLANEOUS) ×1
APL SKNCLS STERI-STRIP NONHPOA (GAUZE/BANDAGES/DRESSINGS) ×1
APPLIER CLIP 9.375 MED OPEN (MISCELLANEOUS) ×2
APR CLP MED 9.3 20 MLT OPN (MISCELLANEOUS) ×1
BENZOIN TINCTURE PRP APPL 2/3 (GAUZE/BANDAGES/DRESSINGS) ×1 IMPLANT
CHLORAPREP W/TINT 26 (MISCELLANEOUS) ×2 IMPLANT
CLIP APPLIE 9.375 MED OPEN (MISCELLANEOUS) IMPLANT
CONT SPEC 4OZ CLIKSEAL STRL BL (MISCELLANEOUS) ×1 IMPLANT
COVER SURGICAL LIGHT HANDLE (MISCELLANEOUS) ×2 IMPLANT
COVER WAND RF STERILE (DRAPES) ×2 IMPLANT
DECANTER SPIKE VIAL GLASS SM (MISCELLANEOUS) ×1 IMPLANT
DERMABOND ADHESIVE PROPEN (GAUZE/BANDAGES/DRESSINGS) ×1
DERMABOND ADVANCED .7 DNX6 (GAUZE/BANDAGES/DRESSINGS) IMPLANT
DRAPE LAPAROTOMY 100X72 PEDS (DRAPES) ×2 IMPLANT
ELECT COATED BLADE 2.86 ST (ELECTRODE) ×1 IMPLANT
ELECT REM PT RETURN 9FT ADLT (ELECTROSURGICAL) ×2
ELECTRODE REM PT RTRN 9FT ADLT (ELECTROSURGICAL) ×1 IMPLANT
GAUZE 4X4 16PLY RFD (DISPOSABLE) ×2 IMPLANT
GAUZE SPONGE 4X4 12PLY STRL (GAUZE/BANDAGES/DRESSINGS) ×2 IMPLANT
GLOVE SS BIOGEL STRL SZ 7 (GLOVE) ×1 IMPLANT
GLOVE SUPERSENSE BIOGEL SZ 7 (GLOVE) ×1
GOWN STRL REUS W/ TWL LRG LVL3 (GOWN DISPOSABLE) ×1 IMPLANT
GOWN STRL REUS W/ TWL XL LVL3 (GOWN DISPOSABLE) ×1 IMPLANT
GOWN STRL REUS W/TWL LRG LVL3 (GOWN DISPOSABLE) ×4
GOWN STRL REUS W/TWL XL LVL3 (GOWN DISPOSABLE) ×2
HEMOSTAT SURGICEL 2X4 FIBR (HEMOSTASIS) ×1 IMPLANT
KIT TURNOVER KIT B (KITS) ×2 IMPLANT
NDL HYPO 25GX1X1/2 BEV (NEEDLE) ×1 IMPLANT
NEEDLE HYPO 25GX1X1/2 BEV (NEEDLE) ×2 IMPLANT
NS IRRIG 1000ML POUR BTL (IV SOLUTION) ×2 IMPLANT
PACK GENERAL/GYN (CUSTOM PROCEDURE TRAY) ×2 IMPLANT
PAD ARMBOARD 7.5X6 YLW CONV (MISCELLANEOUS) ×4 IMPLANT
PENCIL SMOKE EVACUATOR (MISCELLANEOUS) ×2 IMPLANT
SPONGE LAP 4X18 RFD (DISPOSABLE) IMPLANT
STRIP CLOSURE SKIN 1/2X4 (GAUZE/BANDAGES/DRESSINGS) ×1 IMPLANT
SUT MNCRL AB 4-0 PS2 18 (SUTURE) ×2 IMPLANT
SUT VIC AB 3-0 54X BRD REEL (SUTURE) IMPLANT
SUT VIC AB 3-0 BRD 54 (SUTURE) ×2
SUT VIC AB 3-0 SH 18 (SUTURE) ×2 IMPLANT
SYR CONTROL 10ML LL (SYRINGE) ×2 IMPLANT
TOWEL GREEN STERILE (TOWEL DISPOSABLE) ×2 IMPLANT
TOWEL GREEN STERILE FF (TOWEL DISPOSABLE) ×2 IMPLANT

## 2019-05-21 NOTE — Anesthesia Preprocedure Evaluation (Signed)
Anesthesia Evaluation  Patient identified by MRN, date of birth, ID band Patient awake    Reviewed: Allergy & Precautions, NPO status , Patient's Chart, lab work & pertinent test results  Airway Mallampati: II  TM Distance: >3 FB Neck ROM: Full    Dental  (+) Dental Advisory Given   Pulmonary neg pulmonary ROS,    breath sounds clear to auscultation       Cardiovascular negative cardio ROS   Rhythm:Regular Rate:Normal     Neuro/Psych negative neurological ROS     GI/Hepatic negative GI ROS, Neg liver ROS,   Endo/Other  negative endocrine ROS  Renal/GU negative Renal ROS     Musculoskeletal   Abdominal   Peds  Hematology negative hematology ROS (+) anemia ,   Anesthesia Other Findings   Reproductive/Obstetrics                             Lab Results  Component Value Date   WBC 9.0 05/20/2019   HGB 9.2 (L) 05/20/2019   HCT 31.8 (L) 05/20/2019   MCV 75.4 (L) 05/20/2019   PLT 547 (H) 05/20/2019   Lab Results  Component Value Date   CREATININE 0.67 05/20/2019   BUN 6 05/20/2019   NA 137 05/20/2019   K 3.4 (L) 05/20/2019   CL 101 05/20/2019   CO2 24 05/20/2019    Anesthesia Physical Anesthesia Plan  ASA: II  Anesthesia Plan: General   Post-op Pain Management:    Induction: Intravenous  PONV Risk Score and Plan: 3 and Dexamethasone, Ondansetron, Treatment may vary due to age or medical condition and Midazolam  Airway Management Planned: Oral ETT  Additional Equipment:   Intra-op Plan:   Post-operative Plan: Extubation in OR  Informed Consent: I have reviewed the patients History and Physical, chart, labs and discussed the procedure including the risks, benefits and alternatives for the proposed anesthesia with the patient or authorized representative who has indicated his/her understanding and acceptance.     Dental advisory given  Plan Discussed with:  CRNA  Anesthesia Plan Comments:         Anesthesia Quick Evaluation

## 2019-05-21 NOTE — Transfer of Care (Signed)
Immediate Anesthesia Transfer of Care Note  Patient: Elizabeth Beasley  Procedure(s) Performed: EXCISION OF RIGHT DEEP NECK LYMPH NODE (Right Neck)  Patient Location: PACU  Anesthesia Type:General  Level of Consciousness: awake, alert , oriented and patient cooperative  Airway & Oxygen Therapy: Patient Spontanous Breathing and Patient connected to face mask oxygen  Post-op Assessment: Report given to RN and Post -op Vital signs reviewed and stable  Post vital signs: Reviewed and stable  Last Vitals:  Vitals Value Taken Time  BP 112/69 05/21/19 1206  Temp 36.4 C 05/21/19 1207  Pulse 91 05/21/19 1209  Resp 21 05/21/19 1209  SpO2 95 % 05/21/19 1209  Vitals shown include unvalidated device data.  Last Pain:  Vitals:   05/21/19 1207  TempSrc:   PainSc: 0-No pain         Complications: No apparent anesthesia complications

## 2019-05-21 NOTE — Op Note (Signed)
Patient Name:           Elizabeth Beasley   Date of Surgery:        05/21/2019  Pre op Diagnosis:      Lymphadenopathy, lymphoma suspected  Post op Diagnosis:    Same  Procedure:                 Excision deep right cervical lymph node  Surgeon:                     Edsel Petrin. Dalbert Batman, M.D., FACS  Assistant:                      Or staff  Operative Indications:   This is a very pleasant 38 year old female, referred by [name] for evaluation of lymphadenopathy of the neck, possible lymphoma. The patient noticed a lump in her right neck 2 or 3 weeks ago. It is not tender. She's had some night sweats and she's had about a 7 pound weight loss. No swallowing problems. No pulmonary problems. No gastrointestinal problems. She is also has a microcytic anemia and has been started on iron. She is a Sales promotion account executive [employer]      She had an ultrasound of the neck and then a CT scan of the neck, chest, abdomen and pelvis.  The CT scan shows bilateral cervical lymphadenopathy. Larger on the right. There is a 3.0 cm and a 3.5 cm node on the right level IV and 5. Multiple smaller but additional abnormal lymph nodes. Left neck lymph nodes are present and levels 3 and 4 and supraclavicular.  She has relatively bulky mediastinal adenopathy but no abnormalities in the abdomen and pelvis. Lymphoma is felt to be more likely than sarcoidosis.      Family history is negative for multiple endocrine neoplasias. We had a long discussion. I told her that the most likely possibility here was some type of lymphoma possibly Hodgkin's disease. She is anxious to proceed with biopsy which is the next step She will be scheduled for excisional biopsy deep right cervical lymph node, posterior triangle as soon as possible.  I discussed the indications, details techniques, and risk of the surgery with her.She agrees with this plan.  Operative Findings:       The lymph nodes in the right neck, posterior triangle  were multiple, bulky, and quite hard.  I excised a 2 cm, at least, hard lymph node from the right posterior triangle.  I could palpate even larger nodes more medially behind the sternocleidomastoid.  Procedure in Detail:          Following the induction of general LMA anesthesia the patient was positioned with a roll behind her shoulders and her head turned to the left.  The neck and upper chest were prepped and draped in a sterile fashion.  Surgical timeout was performed.  Intravenous antibiotics were given.  0.5% Marcaine with epinephrine was used as local infiltration anesthetic.  A transverse skin crease incision was made overlying the palpable lymph nodes in the posterior triangle of the neck.  Dissection was carried down through the platysma muscle.  The anterior jugular vein had to be isolated, clamped, divided, and ligated with 3-0 Vicryl ties.  A Couple of smaller veins were controlled with Vicryl ties as well.  We mobilized to the clavicular head of the sternocleidomastoid muscle so that we could treat retract that medially.  I identified the lymph node that I wanted  to dissect out.  This was a fairly slow tedious dissection because lymph node was hard and the capsule was fairly fibrotic.  I was able to slowly dissected the lymph node out staying in the capsule and avoid any deeper dissection in hopes of avoiding injury to the spinal accessory nerve.  I do not think there was any injury.  The specimen was removed and sent fresh in saline to the lab for lymphoma work-up.  Hemostasis was excellent and had been achieved with Vicryl ties, metal clips and electrocautery.  The wound was irrigated.  Hemostasis looked good.  I placed a piece of fibrillar hemostatic sponge down into the bed of the dissection area.  The platysma muscle was closed with 3-0 Vicryl sutures and the skin closed with a running subcuticular 4-0 Monocryl and Dermabond.  The patient tolerated the procedure well and was taken to PACU in  stable condition.  EBL 15 cc.  Counts correct.  Complications none.  Addendum: I logged onto the PMP aware website and reviewed her prescription medication history.   Edsel Petrin. Dalbert Batman, M.D., FACS General and Minimally Invasive Surgery Breast and Colorectal Surgery  05/21/2019 12:01 PM

## 2019-05-21 NOTE — Anesthesia Postprocedure Evaluation (Signed)
Anesthesia Post Note  Patient: Elizabeth Beasley  Procedure(s) Performed: EXCISION OF RIGHT DEEP NECK LYMPH NODE (Right Neck)     Patient location during evaluation: PACU Anesthesia Type: General Level of consciousness: awake and alert Pain management: pain level controlled Vital Signs Assessment: post-procedure vital signs reviewed and stable Respiratory status: spontaneous breathing, nonlabored ventilation, respiratory function stable and patient connected to nasal cannula oxygen Cardiovascular status: blood pressure returned to baseline and stable Postop Assessment: no apparent nausea or vomiting Anesthetic complications: no    Last Vitals:  Vitals:   05/21/19 1218 05/21/19 1221  BP: 113/70 102/66  Pulse: 95 92  Resp: (!) 21 18  Temp:    SpO2: 97% 99%    Last Pain:  Vitals:   05/21/19 1217  TempSrc:   PainSc: 0-No pain                 Tiajuana Amass

## 2019-05-21 NOTE — Anesthesia Procedure Notes (Signed)
Procedure Name: Intubation Date/Time: 05/21/2019 10:42 AM Performed by: Cleda Daub, CRNA Pre-anesthesia Checklist: Patient identified, Emergency Drugs available, Suction available and Patient being monitored Patient Re-evaluated:Patient Re-evaluated prior to induction Oxygen Delivery Method: Circle system utilized Preoxygenation: Pre-oxygenation with 100% oxygen Induction Type: IV induction Ventilation: Mask ventilation without difficulty and Mask ventilation throughout procedure Laryngoscope Size: Miller and 2 Grade View: Grade I Tube size: 7.0 mm Number of attempts: 1 Airway Equipment and Method: Stylet Placement Confirmation: ETT inserted through vocal cords under direct vision,  positive ETCO2 and breath sounds checked- equal and bilateral Secured at: 22 cm Tube secured with: Tape Dental Injury: Teeth and Oropharynx as per pre-operative assessment

## 2019-05-21 NOTE — Interval H&P Note (Signed)
History and Physical Interval Note:  05/21/2019 9:54 AM  Elizabeth Beasley  has presented today for surgery, with the diagnosis of POSSIBLE LYMPHOMA, NECK MASS.  The various methods of treatment have been discussed with the patient and family. After consideration of risks, benefits and other options for treatment, the patient has consented to  Procedure(s): EXCISION OF RIGHT DEEP NECK LYMPH NODE (Right) as a surgical intervention.  The patient's history has been reviewed, patient examined, no change in status, stable for surgery.  I have reviewed the patient's chart and labs.  Questions were answered to the patient's satisfaction.     [name]

## 2019-05-21 NOTE — Discharge Instructions (Signed)
Sit up in a chair or prop yourself up with several pillows in the bed to keep your head elevated well above the level of your heart  Stay in the house for 24 hours. You may walk around the block tomorrow You may shower tomorrow  The clear plastic superglue on the incision will wear off in about 3 weeks  You may drive your car in 5 to 6 days once you are moving her neck around well and do not need any narcotic pain medicine  Dr. Dalbert Batman will call the report to you next week as soon as it is completed  Be sure to keep your appointment with Dr. Irene Limbo

## 2019-05-22 ENCOUNTER — Encounter (HOSPITAL_COMMUNITY): Payer: Self-pay | Admitting: General Surgery

## 2019-05-26 ENCOUNTER — Encounter: Payer: Self-pay | Admitting: Family Medicine

## 2019-05-27 ENCOUNTER — Other Ambulatory Visit: Payer: Self-pay | Admitting: Family Medicine

## 2019-05-27 MED ORDER — LEVONORGEST-ETH ESTRAD 91-DAY 0.15-0.03 MG PO TABS: 1.0000 | ORAL_TABLET | Freq: Every day | ORAL | 4 refills | Status: DC

## 2019-05-27 MED FILL — SETLAKIN 0.15 MG-0.03 MG TA: 0.15-0.03 | 91 days supply | Qty: 91 | Fill #0

## 2019-05-31 NOTE — Progress Notes (Signed)
HEMATOLOGY/ONCOLOGY CONSULTATION NOTE  Date of Service: 06/01/2019  Patient Care Team: [name], MD as PCP - General (Family Medicine)  CHIEF COMPLAINTS/PURPOSE OF CONSULTATION:  Microcytic Anemia Newly diagnosed classical hodgkins lymphoma  HISTORY OF PRESENTING ILLNESS:   Elizabeth Beasley is a wonderful 38 y.o. female who has been referred to [place] by Dr Madilyn Fireman for evaluation and management of microcytic anemia/cervical lymphadenopathy. Pt is accompanied today by her husband, Derrick via phone. The pt reports that she is doing well overall.  The pt reports that she has a history of iron deficiency but has never had a transfusion. Pt has had two pregnancies and has two children, ages 33 and 35. There was a concern for anemia with her second pregnancy so she was given an erythropoietin shot. She has had a copper IUD for about 2 years and her menstrual cycle has been slightly longer since getting the IUD. She has been taking 28 mg TID PO iron since June. Pt has no known medication allergies and has had a previous removal of Bartholin's gland cyst. Pt has had no known contact with chemicals either through work or hobbies. She has no history of asthma or heavy smoke/aerosol exposure.   Pt did not notice any cervical lymphadenopathy in June. In late July, early August she began to experience extreme fatigue and could begin to feel small lumps in her neck. Her fatigue is improved with taking her PO Iron TID. She has experienced a couple of episodes of night sweats, with the latest occurring in late August. Pt has also had <10 lbs loss in 2 months. She has not had any rashes but is experiencing itching in her palms, broader itching after hot showers.   Pt is currently working from home. She has had her seasonal flu shot. Pt is not planning on having more children and is not concerned about fertility. Pt's husband would like to take a drive to the [facility] next month. Pt  would not want any blood product transfusions, as she is a Jehovah's Witness. She is comfortable with IV iron infusions.   Of note prior to the patient's visit today, pt has had Lymph Node Bx [id number]) completed on 05/21/2019 with results revealing " CLASSIC HODGKIN LYMPHOMA."  Pt has had CT C/A/P ([id number]) completed on 05/19/2019 with results revealing "1. Enlarged mediastinal, bilateral hilar and lower cervical lymph nodes. Primary considerations include lymphoma, sarcoid and less likely metastatic disease. Normal spleen. No evidence of enlarged lymph nodes within the abdomen or pelvis. 2. Low lying IUD within the LOWER uterine segment and may extend to the cervix."  Pt has had CT Soft Tissue Neck w/contrast ([id number]) completed on 05/17/2019 with results revealing "Bulky right greater than left cervical and mediastinal lymphadenopathy concerning for lymphoma."   Most recent lab results (05/20/2019) of CBC is as follows: WBC at 9.0, RBC at 4.22, Hgb at 9.2, HCT at 31.8, MCV at 75.4, MCH at 21.8, MCHC at 28.9, RDW at 20.1, Platelets at 547K, nRBC at 0.0, Neutro Rel at 71, Neutro Abs at 6.3K, Lymphs Rel at 21, Lymphs Abs tat 1.9K, Monocytes Rel at 6, Monocytes Abs at 0.5K, Eosinophils Rel at 2, Eosinophils Abs at 0.2K, Basophils Rel at 0, Basophils Abs at 0.0K, Abs Immature Granulocytes at 0.03K, Sodium at 137, Potassium at 3.4, Choride at 101, CO2 at 24, Glucose at 88, BUN at 6, Creatinine at 0.67, Calcium at 9.2.  05/14/2019 Fe+TIBC+Fer is as follows: Iron at 11, TIBC at  258, % Sat at 4, Ferritin at 50.  On review of systems, pt reports fatigue, weight loss, palm itching, night sweats, soreness around her cervical surgery site, normal bowel movements and denies trouble swallowing, SOB, rash, fevers, chills, back pain, abdominal pain and any other symptoms.   On PMHx the pt reports C-section, Bartholin's Gland Cyst removal. On Social Hx the pt reports non smoker, no alcohol use. On Family Hx  the pt reports father passed from colon cancer, mother has colon polyps.   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;  Service: Urology;  Laterality: N/A;   v back     2012    SOCIAL HISTORY: Social History   Socioeconomic History   Marital status: Married    Spouse name: Derrick   Number of children: 2   Years of education: Assoc   Highest education level: Not on file  Occupational History   Occupation: CMA    Employer: [employer] strain: Not on file   Food insecurity    Worry: Not on file    Inability: Not on file   Transportation needs    Medical: Not on file    Non-medical: Not on file  Tobacco Use   Smoking status: Never Smoker   Smokeless tobacco: Never Used  Substance and Sexual Activity   Alcohol use: Yes    Comment: ocassional   Drug use: No   Sexual activity: Yes    Partners: Male    Birth control/protection: None  Lifestyle   Physical activity    Days per week: Not on file    Minutes per session: Not on file   Stress: Not on file  Relationships   Social connections    Talks on phone: Not on file    Gets together: Not on file    Attends religious service: Not on file    Active member of club or organization: Not on file    Attends meetings of clubs or organizations: Not on file    Relationship status:  Not on file   Intimate partner violence    Fear of current or ex partner: Not on file    Emotionally abused: Not on file    Physically abused: Not on file    Forced sexual activity: Not on file  Other Topics Concern   Not on file  Social History Narrative   Regular exercise. No regular caffeine.    FAMILY HISTORY: Family History  Problem Relation Age of Onset   Colon cancer Father 23   Colon polyps Mother    Hypertension Mother    Diabetes Paternal Grandmother    Stroke Paternal Grandmother    Alcohol abuse Paternal Uncle    Colon cancer Paternal Aunt 22   Esophageal cancer Neg Hx    Rectal cancer Neg Hx  Stomach cancer Neg Hx     ALLERGIES:  has No Known Allergies.  MEDICATIONS:  Current Outpatient Medications  Medication Sig Dispense Refill   cetirizine (ZYRTEC) 5 MG tablet Take 5 mg by mouth daily as needed for allergies.     Ferrous Sulfate (IRON) 28 MG TABS Take 28 mg by mouth daily.     HYDROcodone-acetaminophen (NORCO) 5-325 MG tablet Take 1-2 tablets by mouth every 6 (six) hours as needed for moderate pain or severe pain. 20 tablet 0   levonorgestrel-ethinyl estradiol (SEASONALE) 0.15-0.03 MG tablet Take 1 tablet by mouth daily. 1 Package 4   paragard intrauterine copper IUD IUD 1 each by Intrauterine route once.     No current facility-administered medications for this visit.     REVIEW OF SYSTEMS:    10 Point review of Systems was done is negative except as noted above.  PHYSICAL EXAMINATION: ECOG PERFORMANCE STATUS: 1 - Symptomatic but completely ambulatory  . Vitals:   06/01/19 1101  BP: 118/67  Pulse: (!) 118  Resp: 18  Temp: 98.7 F (37.1 C)  SpO2: 100%   Filed Weights   06/01/19 1101  Weight: 193 lb (87.5 kg)   .Body mass index is 32.12 kg/m.  GENERAL:alert, in no acute distress and comfortable SKIN: no acute rashes, no significant lesions EYES: conjunctiva are pink and non-injected, sclera anicteric OROPHARYNX:  MMM, no exudates, no oropharyngeal erythema or ulceration NECK: supple, no JVD  LYMPH:  no palpable lymphadenopathy in the axillary or inguinal regions. Bilateral cervical lymphadenopathy, bulky - right more than left.   LUNGS: clear to auscultation b/l with normal respiratory effort HEART: regular rate & rhythm ABDOMEN:  normoactive bowel sounds , non tender, not distended. Extremity: no pedal edema PSYCH: alert & oriented x 3 with fluent speech NEURO: no focal motor/sensory deficits  LABORATORY DATA:  I have reviewed the data as listed  . CBC Latest Ref Rng & Units 06/01/2019 05/20/2019 05/14/2019  WBC 4.0 - 10.5 K/uL 12.5(H) 9.0 9.9  Hemoglobin 12.0 - 15.0 g/dL 8.6(L) 9.2(L) 8.7(L)  Hematocrit 36.0 - 46.0 % 29.4(L) 31.8(L) 28.9(L)  Platelets 150 - 400 K/uL 480(H) 547(H) 511(H)    . CMP Latest Ref Rng & Units 06/01/2019 05/20/2019 05/14/2019  Glucose 70 - 99 mg/dL 96 88 108(H)  BUN 6 - 20 mg/dL 8 6 7   Creatinine 0.44 - 1.00 mg/dL 0.70 0.67 0.66  Sodium 135 - 145 mmol/L 140 137 139  Potassium 3.5 - 5.1 mmol/L 3.6 3.4(L) 4.4  Chloride 98 - 111 mmol/L 104 101 101  CO2 22 - 32 mmol/L 27 24 28   Calcium 8.9 - 10.3 mg/dL 9.4 9.2 9.3  Total Protein 6.5 - 8.1 g/dL 8.6(H) - 8.2(H)  Total Bilirubin 0.3 - 1.2 mg/dL 0.7 - 0.8  Alkaline Phos 38 - 126 U/L 110 - -  AST 15 - 41 U/L 15 - 11  ALT 0 - 44 U/L 31 - 9   . Lab Results  Component Value Date   IRON 8 (L) 06/01/2019   TIBC 233 (L) 06/01/2019   IRONPCTSAT 4 (L) 06/01/2019   (Iron and TIBC)  Lab Results  Component Value Date   FERRITIN 117 06/01/2019    05/21/2019 [id number]) Lymph Node Biopsy    RADIOGRAPHIC STUDIES: I have personally reviewed the radiological images as listed and agreed with the findings in the report. Ct Soft Tissue Neck W Contrast  Result Date: 05/17/2019 CLINICAL DATA:  Localized swelling, mass or lump of neck. Nonspecific lymphadenitis.  EXAM: CT NECK WITH CONTRAST TECHNIQUE: Multidetector CT imaging of  the neck was performed using the standard protocol following the bolus administration of intravenous contrast. CONTRAST:  32mL OMNIPAQUE IOHEXOL 300 MG/ML  SOLN COMPARISON:  Neck ultrasound 05/14/2019 FINDINGS: Pharynx and larynx: No evidence of mass or swelling. Patent airway. Salivary glands: No inflammation, mass, or stone. Thyroid: Unremarkable. Lymph nodes: There is right greater than left cervical lymphadenopathy. The largest nodes are located in the right lower neck in the supraclavicular/level IV and V regions with example nodes measuring 3.0 x 2.1 cm (series 4, image 75) and more inferiorly 3.5 x 2.2 cm (series 4, image 87). There are multiple additional smaller but abnormal lymph nodes in this region as well as more superiorly in the right neck including a 1.6 cm short axis node in right level IIa/III. Abnormal lymph nodes in the left neck are present in levels III-IV and supraclavicular regions measuring up to 1.6 cm in short axis. Mediastinal lymphadenopathy is partially visualized including bulky right paratracheal lymph nodes measuring 3.8 x 2.3 cm (series 4, image 107) and 3.7 x 2.6 cm (series 4, image 100). Vascular: Major vascular structures of the neck are patent. Limited intracranial: Unremarkable. Visualized orbits: Unremarkable. Mastoids and visualized paranasal sinuses: Small right maxillary sinus mucous retention cysts. Clear mastoid air cells. Skeleton: No suspicious osseous lesion. Upper chest: Clear lung apices. Other: None. IMPRESSION: Bulky right greater than left cervical and mediastinal lymphadenopathy concerning for lymphoma. Electronically Signed   By: Logan Bores M.D.   On: 05/17/2019 16:45   Ct Chest W Contrast  Result Date: 05/19/2019 CLINICAL DATA:  38 year old female with cervical lymphadenopathy identified on recent neck CT and anemia. EXAM: CT CHEST, ABDOMEN, AND PELVIS WITH CONTRAST TECHNIQUE: Multidetector CT imaging of the chest, abdomen and pelvis was performed  following the standard protocol during bolus administration of intravenous contrast. CONTRAST:  119mL OMNIPAQUE IOHEXOL 300 MG/ML  SOLN COMPARISON:  None. FINDINGS: CT CHEST FINDINGS Cardiovascular: Heart size is normal. Thoracic aorta is unremarkable. No pericardial effusion. Mediastinum/Nodes: Multiple enlarged lymph nodes are identified throughout the mediastinum and bilateral hilar regions. Supraclavicular adenopathy also noted. Index nodes include the following: A 2 cm RIGHT supraclavicular node (series 2: Image 5) A 4.8 x 6 cm RIGHT paratracheal nodal mass (2:18) 1.5 x 4.9 cm subcarinal nodal mass (2:29). No enlarged axillary lymph nodes are identified. The thyroid gland, trachea and esophagus are unremarkable. No breast abnormalities are identified. Lungs/Pleura: The lungs are clear. No airspace disease, consolidation, nodule, mass, pleural effusion or pneumothorax. Musculoskeletal: No acute or suspicious bony abnormalities. CT ABDOMEN PELVIS FINDINGS Hepatobiliary: No significant hepatic abnormalities except for tiny cysts. Gallbladder is unremarkable. No biliary dilatation. Pancreas: Unremarkable Spleen: Unremarkable Adrenals/Urinary Tract: The kidneys, adrenal glands and bladder are unremarkable. Stomach/Bowel: Stomach is within normal limits. Appendix appears normal. No evidence of bowel wall thickening, distention, or inflammatory changes. Vascular/Lymphatic: No significant vascular findings are present. No enlarged abdominal or pelvic lymph nodes. Reproductive: An IUD is noted within the LOWER uterine segment with possible extension into the cervix. No other uterine abnormality noted. The adnexal regions are unremarkable. Other: No ascites, focal collection or pneumoperitoneum. Musculoskeletal: No acute or significant osseous findings. IMPRESSION: 1. Enlarged mediastinal, bilateral hilar and lower cervical lymph nodes. Primary considerations include lymphoma, sarcoid and less likely metastatic disease.  Normal spleen. No evidence of enlarged lymph nodes within the abdomen or pelvis. 2. Low lying IUD within the LOWER uterine segment and may extend to the cervix. Electronically Signed  By: Margarette Canada M.D.   On: 05/19/2019 16:10   US Soft Tissue Head/neck  Result Date: 05/14/2019 CLINICAL DATA:  Neck mass and swollen lymph node on the right. Patient reports 2 week duration. Tenderness. EXAM: ULTRASOUND OF HEAD/NECK SOFT TISSUES TECHNIQUE: Ultrasound examination of the head and neck soft tissues was performed in the area of clinical concern. COMPARISON:  None. FINDINGS: Scanning in the region of concern show multiple enlarged lymph nodes in the right low neck and supraclavicular region. The largest measures 3.2 x 2.0 x 3.2 cm. Second largest measures 2.1 x 1.1 x 1.1 cm. Due to the multiplicity, these are viewed with concern. Whereas they could be reactive to inflammatory disease, the possibility of lymphoma or metastatic disease from thyroid or other neck malignancy is not excluded. Consider CT neck with contrast. IMPRESSION: Multiple abnormal lymph nodes on the right, the largest measuring 3.2 x 2.0 x 3.2 cm. See above discussion. Further imaging recommended. Electronically Signed   By: Nelson Chimes M.D.   On: 05/14/2019 16:13   Ct Abdomen Pelvis W Contrast  Result Date: 05/19/2019 CLINICAL DATA:  38 year old female with cervical lymphadenopathy identified on recent neck CT and anemia. EXAM: CT CHEST, ABDOMEN, AND PELVIS WITH CONTRAST TECHNIQUE: Multidetector CT imaging of the chest, abdomen and pelvis was performed following the standard protocol during bolus administration of intravenous contrast. CONTRAST:  115mL OMNIPAQUE IOHEXOL 300 MG/ML  SOLN COMPARISON:  None. FINDINGS: CT CHEST FINDINGS Cardiovascular: Heart size is normal. Thoracic aorta is unremarkable. No pericardial effusion. Mediastinum/Nodes: Multiple enlarged lymph nodes are identified throughout the mediastinum and bilateral hilar regions.  Supraclavicular adenopathy also noted. Index nodes include the following: A 2 cm RIGHT supraclavicular node (series 2: Image 5) A 4.8 x 6 cm RIGHT paratracheal nodal mass (2:18) 1.5 x 4.9 cm subcarinal nodal mass (2:29). No enlarged axillary lymph nodes are identified. The thyroid gland, trachea and esophagus are unremarkable. No breast abnormalities are identified. Lungs/Pleura: The lungs are clear. No airspace disease, consolidation, nodule, mass, pleural effusion or pneumothorax. Musculoskeletal: No acute or suspicious bony abnormalities. CT ABDOMEN PELVIS FINDINGS Hepatobiliary: No significant hepatic abnormalities except for tiny cysts. Gallbladder is unremarkable. No biliary dilatation. Pancreas: Unremarkable Spleen: Unremarkable Adrenals/Urinary Tract: The kidneys, adrenal glands and bladder are unremarkable. Stomach/Bowel: Stomach is within normal limits. Appendix appears normal. No evidence of bowel wall thickening, distention, or inflammatory changes. Vascular/Lymphatic: No significant vascular findings are present. No enlarged abdominal or pelvic lymph nodes. Reproductive: An IUD is noted within the LOWER uterine segment with possible extension into the cervix. No other uterine abnormality noted. The adnexal regions are unremarkable. Other: No ascites, focal collection or pneumoperitoneum. Musculoskeletal: No acute or significant osseous findings. IMPRESSION: 1. Enlarged mediastinal, bilateral hilar and lower cervical lymph nodes. Primary considerations include lymphoma, sarcoid and less likely metastatic disease. Normal spleen. No evidence of enlarged lymph nodes within the abdomen or pelvis. 2. Low lying IUD within the LOWER uterine segment and may extend to the cervix. Electronically Signed   By: Margarette Canada M.D.   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 2) Microcytic Anemia due to anemia of chronic  disease and Iron deficiency 3) Jehovas Witness PLAN  -Discussed patient's most recent labs from 05/20/2019, WBC at 9.0, RBC at 4.22, Hgb at 9.2, HCT at 31.8, MCV at 75.4, MCH at 21.8, MCHC at 28.9, RDW at 20.1, Platelets at 547K, nRBC  at 0.0, Neutro Rel at 71, Neutro Abs at 6.3K, Lymphs Rel at 21, Lymphs Abs tat 1.9K, Monocytes Rel at 6, Monocytes Abs at 0.5K, Eosinophils Rel at 2, Eosinophils Abs at 0.2K, Basophils Rel at 0, Basophils Abs at 0.0K, Abs Immature Granulocytes at 0.03K, Sodium at 137, Potassium at 3.4, Choride at 101, CO2 at 24, Glucose at 88, BUN at 6, Creatinine at 0.67, Calcium at 9.2.  -Discussed 05/14/2019 Fe+TIBC+Fer is as follows: Iron at 11, TIBC at 258, % Sat at 4, Ferritin at 50. -Discussed 05/21/2019 Lymph Node Bx [id number]) which revealed " CLASSIC HODGKIN LYMPHOMA." -Discussed 05/19/2019 CT C/A/P ([id number]) which revealed  "1. Enlarged mediastinal, bilateral hilar and lower cervical lymph nodes. Primary considerations include lymphoma, sarcoid and less likely metastatic disease. Normal spleen. No evidence of enlarged lymph nodes within the abdomen or pelvis. 2. Low lying IUD within the LOWER uterine segment and may extend to the cervix." -Discussed 05/17/2019 CT Soft Tissue Neck w/contrast ([id number]) which revealed "Bulky right greater than left cervical and mediastinal lymphadenopathy concerning for lymphoma."  -Based on laboratory and radiologic data pt has Hodgkin's Lymphoma, at least stage 2. -Recommended that the pt continue to eat well, drink at least 48-64 oz of water each day, and walk 20-30 minutes each day.  -Advised proper pandemic safety precautions  -Wil order Prevnar vaccine before pt begins treatment, but after the PET/CT scan and Pneumovax after treatment -Discussed that remission after treatment depends largely on the staging of the disease and is variable between persons -Advised that certain clinical trials will require a baseline BM Bx -Advised that  if PET/CT shows bone marrow involvement or labs create a concern of bone marrow involvement we might consider a BM Bx. -Advised pt on potential fertility loss -Advised pt to hold off on Sesonale prescription due to the risks of bloodclots -No blood product transfusions, pt is a Sales promotion account executive Witness  -Will refer pt to surgery for port placement -Will refer pt for chemotherapy counseling  -Plan for pt to begin ABVD chemotherapy in 2 weeks -Will order IV Injectafer weekly x 2 -Will order ECHO for baseline cardiac function -Will order baseline pulmonary function test  -Will order PET/CT scan -Will order labs today -Will see back in 12 days   FOLLOW UP: Labs today IV Injectafer weekly x 2 ASAP PET/CT in 5  days ECHO in 3-4 days  PFTs in 3-4 days Chemo-counseling for ABVD in 7-10 days  MD visit in 12 days Schedule to start ABVD chemotherapy in 2 weeks with labs (2-3 days after followup MD visit) Surgery referral for port a cath placement called in already to Dr Dalbert Batman   All of the patients questions were answered with apparent satisfaction. The patient knows to call the clinic with any problems, questions or concerns.  I spent 22mins  counseling the patient face to face. The total time spent in the appointment was 54mins  and more than 50% was on counseling and direct patient cares.    Sullivan Lone MD [place] [facility] Hematology/Oncology Physician [facility]  (Office):       [phone] (Work cell):  [phone] (Fax):           [phone]  06/01/2019 2:02 PM  I, Yevette Edwards, am acting as a scribe for Dr. Sullivan Lone.   .I have reviewed the above documentation for accuracy and completeness, and I agree with the above. Brunetta Genera MD

## 2019-06-01 ENCOUNTER — Inpatient Hospital Stay: Payer: No Typology Code available for payment source

## 2019-06-01 ENCOUNTER — Telehealth: Payer: Self-pay | Admitting: Hematology

## 2019-06-01 ENCOUNTER — Other Ambulatory Visit: Payer: Self-pay

## 2019-06-01 ENCOUNTER — Inpatient Hospital Stay: Payer: No Typology Code available for payment source | Attending: Hematology | Admitting: Hematology

## 2019-06-01 ENCOUNTER — Other Ambulatory Visit: Payer: Self-pay | Admitting: General Surgery

## 2019-06-01 VITALS — BP 118/67 | HR 118 | Temp 98.7°F | Resp 18 | Ht 65.0 in | Wt 193.0 lb

## 2019-06-01 DIAGNOSIS — R61 Generalized hyperhidrosis: Secondary | ICD-10-CM | POA: Diagnosis not present

## 2019-06-01 DIAGNOSIS — IMO0001 Reserved for inherently not codable concepts without codable children: Secondary | ICD-10-CM

## 2019-06-01 DIAGNOSIS — D509 Iron deficiency anemia, unspecified: Secondary | ICD-10-CM | POA: Insufficient documentation

## 2019-06-01 DIAGNOSIS — Z793 Long term (current) use of hormonal contraceptives: Secondary | ICD-10-CM | POA: Diagnosis not present

## 2019-06-01 DIAGNOSIS — R634 Abnormal weight loss: Secondary | ICD-10-CM | POA: Diagnosis not present

## 2019-06-01 DIAGNOSIS — Z79899 Other long term (current) drug therapy: Secondary | ICD-10-CM | POA: Insufficient documentation

## 2019-06-01 DIAGNOSIS — D500 Iron deficiency anemia secondary to blood loss (chronic): Secondary | ICD-10-CM | POA: Insufficient documentation

## 2019-06-01 DIAGNOSIS — Z789 Other specified health status: Secondary | ICD-10-CM | POA: Diagnosis not present

## 2019-06-01 DIAGNOSIS — R5383 Other fatigue: Secondary | ICD-10-CM | POA: Diagnosis not present

## 2019-06-01 DIAGNOSIS — C8118 Nodular sclerosis classical Hodgkin lymphoma, lymph nodes of multiple sites: Secondary | ICD-10-CM | POA: Diagnosis present

## 2019-06-01 DIAGNOSIS — D638 Anemia in other chronic diseases classified elsewhere: Secondary | ICD-10-CM | POA: Insufficient documentation

## 2019-06-01 LAB — CMP (CANCER CENTER ONLY)
ALT: 31 U/L (ref 0–44)
AST: 15 U/L (ref 15–41)
Albumin: 2.9 g/dL — ABNORMAL LOW (ref 3.5–5.0)
Alkaline Phosphatase: 110 U/L (ref 38–126)
Anion gap: 9 (ref 5–15)
BUN: 8 mg/dL (ref 6–20)
CO2: 27 mmol/L (ref 22–32)
Calcium: 9.4 mg/dL (ref 8.9–10.3)
Chloride: 104 mmol/L (ref 98–111)
Creatinine: 0.7 mg/dL (ref 0.44–1.00)
GFR, Est AFR Am: >60 mL/min (ref >60)
GFR, Estimated: >60 mL/min (ref >60)
Glucose, Bld: 96 mg/dL (ref 70–99)
Potassium: 3.6 mmol/L (ref 3.5–5.1)
Sodium: 140 mmol/L (ref 135–145)
Total Bilirubin: 0.7 mg/dL (ref 0.3–1.2)
Total Protein: 8.6 g/dL — ABNORMAL HIGH (ref 6.5–8.1)

## 2019-06-01 LAB — CBC WITH DIFFERENTIAL/PLATELET
Abs Immature Granulocytes: 0.05 10*3/uL (ref 0.00–0.07)
Basophils Absolute: 0 10*3/uL (ref 0.0–0.1)
Basophils Relative: 0 %
Eosinophils Absolute: 0.1 10*3/uL (ref 0.0–0.5)
Eosinophils Relative: 1 %
HCT: 29.4 % — ABNORMAL LOW (ref 36.0–46.0)
Hemoglobin: 8.6 g/dL — ABNORMAL LOW (ref 12.0–15.0)
Immature Granulocytes: 0 %
Lymphocytes Relative: 15 %
Lymphs Abs: 1.8 10*3/uL (ref 0.7–4.0)
MCH: 22.2 pg — ABNORMAL LOW (ref 26.0–34.0)
MCHC: 29.3 g/dL — ABNORMAL LOW (ref 30.0–36.0)
MCV: 75.8 fL — ABNORMAL LOW (ref 80.0–100.0)
Monocytes Absolute: 0.8 10*3/uL (ref 0.1–1.0)
Monocytes Relative: 7 %
Neutro Abs: 9.6 10*3/uL — ABNORMAL HIGH (ref 1.7–7.7)
Neutrophils Relative %: 77 %
Platelets: 480 10*3/uL — ABNORMAL HIGH (ref 150–400)
RBC: 3.88 MIL/uL (ref 3.87–5.11)
RDW: 21.1 % — ABNORMAL HIGH (ref 11.5–15.5)
WBC: 12.5 10*3/uL — ABNORMAL HIGH (ref 4.0–10.5)
nRBC: 0 % (ref 0.0–0.2)

## 2019-06-01 LAB — LACTATE DEHYDROGENASE: LDH: 223 U/L — ABNORMAL HIGH (ref 98–192)

## 2019-06-01 LAB — IRON AND TIBC
Iron: 8 ug/dL — ABNORMAL LOW (ref 41–142)
Saturation Ratios: 4 % — ABNORMAL LOW (ref 21–57)
TIBC: 233 ug/dL — ABNORMAL LOW (ref 236–444)
UIBC: 224 ug/dL (ref 120–384)

## 2019-06-01 LAB — TSH: TSH: 2.011 u[IU]/mL (ref 0.308–3.960)

## 2019-06-01 LAB — SEDIMENTATION RATE: Sed Rate: 115 mm/hr — ABNORMAL HIGH (ref 0–22)

## 2019-06-01 LAB — VITAMIN B12: Vitamin B-12: 595 pg/mL (ref 180–914)

## 2019-06-01 LAB — FERRITIN: Ferritin: 117 ng/mL (ref 11–307)

## 2019-06-01 NOTE — Telephone Encounter (Signed)
Gave avs and calendar ° °

## 2019-06-02 ENCOUNTER — Other Ambulatory Visit: Payer: Self-pay

## 2019-06-02 ENCOUNTER — Encounter (HOSPITAL_BASED_OUTPATIENT_CLINIC_OR_DEPARTMENT_OTHER): Payer: Self-pay | Admitting: *Deleted

## 2019-06-04 ENCOUNTER — Other Ambulatory Visit (HOSPITAL_COMMUNITY)
Admission: RE | Admit: 2019-06-04 | Discharge: 2019-06-04 | Disposition: A | Discharge status: Home / Self Care | Payer: No Typology Code available for payment source | Source: Ambulatory Visit | Attending: General Surgery | Admitting: General Surgery

## 2019-06-04 ENCOUNTER — Inpatient Hospital Stay: Payer: No Typology Code available for payment source

## 2019-06-04 ENCOUNTER — Ambulatory Visit (HOSPITAL_COMMUNITY)
Admission: RE | Admit: 2019-06-04 | Discharge: 2019-06-04 | Disposition: A | Discharge status: Home / Self Care | Payer: No Typology Code available for payment source | Source: Ambulatory Visit | Attending: Hematology | Admitting: Hematology

## 2019-06-04 ENCOUNTER — Other Ambulatory Visit: Payer: Self-pay

## 2019-06-04 ENCOUNTER — Encounter: Payer: Self-pay | Admitting: Hematology

## 2019-06-04 VITALS — BP 109/70 | HR 94 | Temp 98.7°F | Resp 20

## 2019-06-04 DIAGNOSIS — I083 Combined rheumatic disorders of mitral, aortic and tricuspid valves: Secondary | ICD-10-CM | POA: Diagnosis not present

## 2019-06-04 DIAGNOSIS — Z01810 Encounter for preprocedural cardiovascular examination: Secondary | ICD-10-CM | POA: Insufficient documentation

## 2019-06-04 DIAGNOSIS — D500 Iron deficiency anemia secondary to blood loss (chronic): Secondary | ICD-10-CM

## 2019-06-04 DIAGNOSIS — Z20828 Contact with and (suspected) exposure to other viral communicable diseases: Secondary | ICD-10-CM | POA: Diagnosis not present

## 2019-06-04 DIAGNOSIS — C8118 Nodular sclerosis classical Hodgkin lymphoma, lymph nodes of multiple sites: Secondary | ICD-10-CM | POA: Diagnosis not present

## 2019-06-04 DIAGNOSIS — Z01812 Encounter for preprocedural laboratory examination: Secondary | ICD-10-CM | POA: Insufficient documentation

## 2019-06-04 LAB — ECHOCARDIOGRAM COMPLETE

## 2019-06-04 MED ORDER — ACETAMINOPHEN 325 MG PO TABS
ORAL_TABLET | ORAL | Status: AC
  Filled 2019-06-04: qty 1

## 2019-06-04 MED ORDER — LORATADINE 10 MG PO TABS
ORAL_TABLET | ORAL | Status: AC
  Filled 2019-06-04: qty 1

## 2019-06-04 MED ORDER — ACETAMINOPHEN 325 MG PO TABS
650.0000 mg | ORAL_TABLET | Freq: Once | ORAL | Status: AC
  Administered 2019-06-04: 650 mg via ORAL

## 2019-06-04 MED ORDER — LORATADINE 10 MG PO TABS
10.0000 mg | ORAL_TABLET | Freq: Once | ORAL | Status: AC
  Administered 2019-06-04: 10 mg via ORAL

## 2019-06-04 MED ORDER — SODIUM CHLORIDE 0.9 % IV SOLN
Freq: Once | INTRAVENOUS | Status: AC
  Administered 2019-06-04: 09:00:00 via INTRAVENOUS
  Filled 2019-06-04: qty 250

## 2019-06-04 MED ORDER — SODIUM CHLORIDE 0.9 % IV SOLN
750.0000 mg | Freq: Once | INTRAVENOUS | Status: AC
  Administered 2019-06-04: 750 mg via INTRAVENOUS
  Filled 2019-06-04: qty 15

## 2019-06-04 NOTE — Patient Instructions (Signed)

## 2019-06-04 NOTE — Progress Notes (Signed)
Echocardiogram 2D Echocardiogram has been performed.  Elizabeth Beasley 06/04/2019, 2:21 PM

## 2019-06-05 ENCOUNTER — Other Ambulatory Visit (HOSPITAL_COMMUNITY): Payer: No Typology Code available for payment source | Attending: General Surgery

## 2019-06-05 LAB — NOVEL CORONAVIRUS, NAA (HOSP ORDER, SEND-OUT TO REF LAB; TAT 18-24 HRS)

## 2019-06-05 NOTE — Progress Notes (Signed)
Contacted patient regarding covid results being inconclusive. Left voicemail informing patient that she would need to come in 06/07/19 for retest. Patient added to schedule for Monday.

## 2019-06-06 NOTE — H&P (Signed)
Elizabeth Beasley  Location: [facility] Patient #: [id number] DOB: 02/14/1981 Married / Language: English / Race: Black or African American Female      History of Present Illness  . This is a very pleasant 38 year old female, referred by [name] for evaluation of lymphadenopathy of the neck, which is Hodgkin's Lymphoma.  The patient noticed a lump in her right neck 2 or 3 weeks ago. It is not tender. She's had some night sweats and she's had about a 7 pound weight loss. No swallowing problems. No pulmonary problems. No gastrointestinal problems. She is also has a microcytic anemia and has been started on iron. She is a Sales promotion account executive [employer]       She had an ultrasound of the neck and then a CT scan of the neck the CT scan shows bilateral cervical lymphadenopathy. Larger on the right. There is a 3.0 cm and a 3.5 cm node on the right level IV and 5. Multiple smaller but additional abnormal lymph nodes. Left neck lymph nodes are present and levels 3 and 4 and supraclavicular. Mediastinal lymphadenopathies partially visualized with bulky right paratracheal nodes. Nothing else was noted in the neck or upper chest.       Recent open biopsy shows Hodgkins Lymphoma.      Family history is negative for multiple endocrine neoplasias.  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 She is married with 2 daughters. Denies tobacco. Drinks alcohol occasionally. 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.       Plan: We had a long discussion. Biopsy shows Hodgkin's lymphoma and Dr. Irene Limbo requests port insertion to facilitate chemo. I discussed the indications, details techniques, and risk of the surgery with her. She's aware the risk of bleeding, infection, injury to adjacent organs. Pneumothorax, vascular injury arrhythmia, etc.    Past Surgical History  Cesarean Section - 1   Diagnostic  Studies History  Colonoscopy  within last year Mammogram  1-3 years ago Pap Smear  never  Allergies  No Known Allergies   No Known Drug Allergies  Allergies Reconciled   Medication History  No Current Medications Medications Reconciled  Social History  Caffeine use  Carbonated beverages. No alcohol use  No drug use  Tobacco use  Never smoker.  Family History  Colon Cancer  Father. Colon Polyps  Mother. Diabetes Mellitus  Mother. Hypertension  Mother.  Pregnancy / Birth History  Age at menarche  58 years. Gravida  2 Length (months) of breastfeeding  3-6 Maternal age  45-30 Para  2 Regular periods     Review of Systems  General Present- Fatigue. Not Present- Appetite Loss, Chills, Fever, Night Sweats, Weight Gain and Weight Loss. HEENT Present- Hoarseness. Not Present- Earache, Hearing Loss, Nose Bleed, Oral Ulcers, Ringing in the Ears, Seasonal Allergies, Sinus Pain, Sore Throat, Visual Disturbances, Wears glasses/contact lenses and Yellow Eyes. Respiratory Not Present- Bloody sputum, Chronic Cough, Difficulty Breathing, Snoring and Wheezing. Cardiovascular Not Present- Chest Pain, Difficulty Breathing Lying Down, Leg Cramps, Palpitations, Rapid Heart Rate, Shortness of Breath and Swelling of Extremities. Gastrointestinal Present- Bloating. Not Present- Abdominal Pain, Bloody Stool, Change in Bowel Habits, Chronic diarrhea, Constipation, Difficulty Swallowing, Excessive gas, Gets full quickly at meals, Hemorrhoids, Indigestion, Nausea, Rectal Pain and Vomiting. Female Genitourinary Not Present- Frequency, Nocturia, Painful Urination, Pelvic Pain and Urgency. Musculoskeletal Not Present- Back Pain, Joint Pain, Joint Stiffness, Muscle Pain, Muscle Weakness and Swelling  of Extremities. Neurological Not Present- Decreased Memory, Fainting, Headaches, Numbness, Seizures, Tingling, Tremor, Trouble walking and Weakness. Psychiatric Not Present- Anxiety,  Bipolar, Change in Sleep Pattern, Depression, Fearful and Frequent crying. Hematology Present- Gland problems. Not Present- Blood Thinners, Easy Bruising, Excessive bleeding, HIV and Persistent Infections.  Vitals  Weight: 195.6 lb Height: 65in Body Surface Area: 1.96 m Body Mass Index: 32.55 kg/m  Temp.: 96.1F (Temporal)  Pulse: 128 (Regular)  BP: 150/84(Sitting, Left Arm, Standard)     Physical Exam  General Mental Status-Alert. General Appearance-Consistent with stated age. Hydration-Well hydrated. Voice-Normal.  Head and Neck Head-normocephalic, atraumatic with no lesions or palpable masses. Trachea-midline. Thyroid Gland Characteristics - normal size and consistency.  Eye Eyeball - Bilateral-Extraocular movements intact. Sclera/Conjunctiva - Bilateral-No scleral icterus.  Chest and Lung Exam Chest and lung exam reveals -quiet, even and easy respiratory effort with no use of accessory muscles and on auscultation, normal breath sounds, no adventitious sounds and normal vocal resonance. Inspection Chest Wall - Normal. Back - normal.  Cardiovascular Cardiovascular examination reveals -normal heart sounds, regular rate and rhythm with no murmurs and normal pedal pulses bilaterally.  Abdomen Inspection Inspection of the abdomen reveals - No Hernias. Palpation/Percussion Palpation and Percussion of the abdomen reveal - Soft, Non Tender, No Rebound tenderness, No Rigidity (guarding) and No hepatosplenomegaly. Auscultation Auscultation of the abdomen reveals - Bowel sounds normal.  Neurologic Neurologic evaluation reveals -alert and oriented x 3 with no impairment of recent or remote memory. Mental Status-Normal.  Musculoskeletal Normal Exam - Left-Upper Extremity Strength Normal and Lower Extremity Strength Normal. Normal Exam - Right-Upper Extremity Strength Normal and Lower Extremity Strength Normal.  Lymphatic Note:  Bulky but soft, somewhat mobile lymphadenopathy most notable in the right neck posterior triangle. Small nodes on the left. No suboccipital nodes. Borderline axillary adenopathy. No inguinal adenopathy. Spleen and liver did not appear enlarged.     Assessment and Plan:  LYMPHADENOPATHY, CERVICAL (R59.0)   You have recently noticed enlarged lymph nodes in your right neck I am also able to feel these in the right neck, posterior triangle your CT scan shows enlarged lymph nodes in the right neck and to a lesser extent the left neck. There also appears to be some lymph nodes that are enlarged in the chest  Biopsy shows Hodgkins Lymphoma and Dr. Irene Limbo requests port a cath insertion. you will be scheduled in the near future for  Port insertion under general anesthesia you will be able to go home the same day We have discussed the indications, techniques, and risk of the surgery in detail     MICROCYTIC ANEMIA (D50.9)   BMI 32.0-32.9,ADULT (Z68.32)   HISTORY OF C-SECTION [id number])    Edsel Petrin. Dalbert Batman, M.D., [facility] General and Minimally invasive Surgery Breast and Colorectal Surgery Office:   [phone] Pager:   [phone]

## 2019-06-07 ENCOUNTER — Other Ambulatory Visit (HOSPITAL_COMMUNITY)
Admission: RE | Admit: 2019-06-07 | Discharge: 2019-06-07 | Disposition: A | Discharge status: Home / Self Care | Payer: No Typology Code available for payment source | Source: Ambulatory Visit | Attending: General Surgery | Admitting: General Surgery

## 2019-06-07 DIAGNOSIS — Z20828 Contact with and (suspected) exposure to other viral communicable diseases: Secondary | ICD-10-CM | POA: Diagnosis not present

## 2019-06-07 DIAGNOSIS — Z01812 Encounter for preprocedural laboratory examination: Secondary | ICD-10-CM | POA: Insufficient documentation

## 2019-06-07 LAB — SARS CORONAVIRUS 2 (TAT 6-24 HRS): SARS Coronavirus 2: NEGATIVE

## 2019-06-08 ENCOUNTER — Ambulatory Visit (HOSPITAL_COMMUNITY): Payer: No Typology Code available for payment source

## 2019-06-08 ENCOUNTER — Ambulatory Visit (HOSPITAL_BASED_OUTPATIENT_CLINIC_OR_DEPARTMENT_OTHER): Payer: No Typology Code available for payment source | Admitting: Anesthesiology

## 2019-06-08 ENCOUNTER — Encounter (HOSPITAL_BASED_OUTPATIENT_CLINIC_OR_DEPARTMENT_OTHER)
Admission: RE | Disposition: A | Discharge status: Home / Self Care | Payer: Self-pay | Source: Home / Self Care | Attending: General Surgery

## 2019-06-08 ENCOUNTER — Ambulatory Visit (HOSPITAL_BASED_OUTPATIENT_CLINIC_OR_DEPARTMENT_OTHER)
Admission: RE | Admit: 2019-06-08 | Discharge: 2019-06-08 | Disposition: A | Discharge status: Home / Self Care | Payer: No Typology Code available for payment source | Attending: General Surgery | Admitting: General Surgery

## 2019-06-08 ENCOUNTER — Other Ambulatory Visit: Payer: Self-pay

## 2019-06-08 ENCOUNTER — Encounter (HOSPITAL_BASED_OUTPATIENT_CLINIC_OR_DEPARTMENT_OTHER): Payer: Self-pay | Admitting: *Deleted

## 2019-06-08 DIAGNOSIS — D509 Iron deficiency anemia, unspecified: Secondary | ICD-10-CM | POA: Insufficient documentation

## 2019-06-08 DIAGNOSIS — Z6832 Body mass index (BMI) 32.0-32.9, adult: Secondary | ICD-10-CM | POA: Insufficient documentation

## 2019-06-08 DIAGNOSIS — C8191 Hodgkin lymphoma, unspecified, lymph nodes of head, face, and neck: Secondary | ICD-10-CM | POA: Insufficient documentation

## 2019-06-08 DIAGNOSIS — Z79899 Other long term (current) drug therapy: Secondary | ICD-10-CM | POA: Insufficient documentation

## 2019-06-08 DIAGNOSIS — Z95828 Presence of other vascular implants and grafts: Secondary | ICD-10-CM

## 2019-06-08 DIAGNOSIS — C8111 Nodular sclerosis classical Hodgkin lymphoma, lymph nodes of head, face, and neck: Secondary | ICD-10-CM

## 2019-06-08 DIAGNOSIS — E669 Obesity, unspecified: Secondary | ICD-10-CM | POA: Diagnosis not present

## 2019-06-08 DIAGNOSIS — C8110 Nodular sclerosis classical Hodgkin lymphoma, unspecified site: Secondary | ICD-10-CM | POA: Diagnosis present

## 2019-06-08 HISTORY — DX: Nodular sclerosis Hodgkin lymphoma, unspecified site: C81.10

## 2019-06-08 HISTORY — DX: Anemia, unspecified: D64.9

## 2019-06-08 HISTORY — DX: Other specified postprocedural states: Z98.890

## 2019-06-08 HISTORY — PX: PORTACATH PLACEMENT: SHX2246

## 2019-06-08 HISTORY — DX: Other specified postprocedural states: R11.2

## 2019-06-08 LAB — POCT PREGNANCY, URINE: Preg Test, Ur: NEGATIVE

## 2019-06-08 SURGERY — INSERTION, TUNNELED CENTRAL VENOUS DEVICE, WITH PORT
Anesthesia: General | Site: Chest | Laterality: Right

## 2019-06-08 MED ORDER — SCOPOLAMINE 1 MG/3DAYS TD PT72: 1.0000 | MEDICATED_PATCH | TRANSDERMAL | Status: DC

## 2019-06-08 MED ORDER — LACTATED RINGERS IV SOLN: INTRAVENOUS | Status: DC

## 2019-06-08 MED ORDER — SODIUM CHLORIDE 0.9% FLUSH: 3.0000 mL | INTRAVENOUS | Status: DC | PRN

## 2019-06-08 MED ORDER — PROPOFOL 500 MG/50ML IV EMUL
INTRAVENOUS | Status: DC | PRN
  Administered 2019-06-08: 150 ug/kg/min via INTRAVENOUS

## 2019-06-08 MED ORDER — SODIUM CHLORIDE 0.9 % IV SOLN: 250.0000 mL | INTRAVENOUS | Status: DC | PRN

## 2019-06-08 MED ORDER — HYDROCODONE-ACETAMINOPHEN 5-325 MG PO TABS: 1.0000 | ORAL_TABLET | Freq: Four times a day (QID) | ORAL | 0 refills | Status: DC | PRN

## 2019-06-08 MED ORDER — ONDANSETRON HCL 4 MG/2ML IJ SOLN
INTRAMUSCULAR | Status: DC | PRN
  Administered 2019-06-08: 4 mg via INTRAVENOUS

## 2019-06-08 MED ORDER — OXYCODONE HCL 5 MG PO TABS: 5.0000 mg | ORAL_TABLET | Freq: Once | ORAL | Status: DC | PRN

## 2019-06-08 MED ORDER — DIPHENHYDRAMINE HCL 50 MG/ML IJ SOLN
INTRAMUSCULAR | Status: AC
  Filled 2019-06-08: qty 1

## 2019-06-08 MED ORDER — SODIUM BICARBONATE 4.2 % IV SOLN
INTRAVENOUS | Status: AC
  Filled 2019-06-08: qty 10

## 2019-06-08 MED ORDER — ACETAMINOPHEN 500 MG PO TABS
ORAL_TABLET | ORAL | Status: AC
  Filled 2019-06-08: qty 2

## 2019-06-08 MED ORDER — DEXAMETHASONE SODIUM PHOSPHATE 4 MG/ML IJ SOLN
INTRAMUSCULAR | Status: DC | PRN
  Administered 2019-06-08: 10 mg via INTRAVENOUS

## 2019-06-08 MED ORDER — PROPOFOL 500 MG/50ML IV EMUL
INTRAVENOUS | Status: AC
  Filled 2019-06-08: qty 50

## 2019-06-08 MED ORDER — DIPHENHYDRAMINE HCL 50 MG/ML IJ SOLN
25.0000 mg | Freq: Once | INTRAMUSCULAR | Status: AC
  Administered 2019-06-08: 25 mg via INTRAVENOUS

## 2019-06-08 MED ORDER — OXYCODONE HCL 5 MG PO TABS: 5.0000 mg | ORAL_TABLET | ORAL | Status: DC | PRN

## 2019-06-08 MED ORDER — CELECOXIB 200 MG PO CAPS
200.0000 mg | ORAL_CAPSULE | ORAL | Status: AC
  Administered 2019-06-08: 200 mg via ORAL

## 2019-06-08 MED ORDER — PROPOFOL 10 MG/ML IV BOLUS
INTRAVENOUS | Status: DC | PRN
  Administered 2019-06-08: 50 mg via INTRAVENOUS
  Administered 2019-06-08: 150 mg via INTRAVENOUS

## 2019-06-08 MED ORDER — FENTANYL CITRATE (PF) 100 MCG/2ML IJ SOLN: 25.0000 ug | INTRAMUSCULAR | Status: DC | PRN

## 2019-06-08 MED ORDER — METOCLOPRAMIDE HCL 5 MG/ML IJ SOLN: 10.0000 mg | Freq: Once | INTRAMUSCULAR | Status: DC | PRN

## 2019-06-08 MED ORDER — OXYCODONE HCL 5 MG/5ML PO SOLN: 5.0000 mg | Freq: Once | ORAL | Status: DC | PRN

## 2019-06-08 MED ORDER — ACETAMINOPHEN 500 MG PO TABS
1000.0000 mg | ORAL_TABLET | ORAL | Status: AC
  Administered 2019-06-08: 1000 mg via ORAL

## 2019-06-08 MED ORDER — HEPARIN (PORCINE) IN NACL 1000-0.9 UT/500ML-% IV SOLN
INTRAVENOUS | Status: AC
  Filled 2019-06-08: qty 500

## 2019-06-08 MED ORDER — GABAPENTIN 300 MG PO CAPS
300.0000 mg | ORAL_CAPSULE | ORAL | Status: AC
  Administered 2019-06-08: 300 mg via ORAL

## 2019-06-08 MED ORDER — CHLORHEXIDINE GLUCONATE CLOTH 2 % EX PADS: 6.0000 | MEDICATED_PAD | Freq: Once | CUTANEOUS | Status: DC

## 2019-06-08 MED ORDER — HEPARIN SOD (PORK) LOCK FLUSH 100 UNIT/ML IV SOLN
INTRAVENOUS | Status: DC | PRN
  Administered 2019-06-08: 500 [IU] via INTRAVENOUS

## 2019-06-08 MED ORDER — LACTATED RINGERS IV SOLN
INTRAVENOUS | Status: DC
  Administered 2019-06-08: 07:00:00 via INTRAVENOUS

## 2019-06-08 MED ORDER — HEPARIN (PORCINE) IN NACL 2-0.9 UNITS/ML
INTRAMUSCULAR | Status: AC | PRN
  Administered 2019-06-08: 1

## 2019-06-08 MED ORDER — BUPIVACAINE-EPINEPHRINE (PF) 0.5% -1:200000 IJ SOLN
INTRAMUSCULAR | Status: AC
  Filled 2019-06-08: qty 30

## 2019-06-08 MED ORDER — FENTANYL CITRATE (PF) 100 MCG/2ML IJ SOLN
INTRAMUSCULAR | Status: AC
  Filled 2019-06-08: qty 2

## 2019-06-08 MED ORDER — BUPIVACAINE-EPINEPHRINE (PF) 0.5% -1:200000 IJ SOLN
INTRAMUSCULAR | Status: DC | PRN
  Administered 2019-06-08: 15 mL

## 2019-06-08 MED ORDER — CELECOXIB 200 MG PO CAPS
ORAL_CAPSULE | ORAL | Status: AC
  Filled 2019-06-08: qty 1

## 2019-06-08 MED ORDER — MIDAZOLAM HCL 2 MG/2ML IJ SOLN
1.0000 mg | INTRAMUSCULAR | Status: DC | PRN
  Administered 2019-06-08: 2 mg via INTRAVENOUS

## 2019-06-08 MED ORDER — LIDOCAINE-EPINEPHRINE 1 %-1:100000 IJ SOLN
INTRAMUSCULAR | Status: AC
  Filled 2019-06-08: qty 1

## 2019-06-08 MED ORDER — DEXAMETHASONE SODIUM PHOSPHATE 10 MG/ML IJ SOLN
INTRAMUSCULAR | Status: AC
  Filled 2019-06-08: qty 1

## 2019-06-08 MED ORDER — CEFAZOLIN SODIUM-DEXTROSE 2-4 GM/100ML-% IV SOLN
2.0000 g | INTRAVENOUS | Status: AC
  Administered 2019-06-08: 2 g via INTRAVENOUS

## 2019-06-08 MED ORDER — MIDAZOLAM HCL 2 MG/2ML IJ SOLN
INTRAMUSCULAR | Status: AC
  Filled 2019-06-08: qty 2

## 2019-06-08 MED ORDER — ACETAMINOPHEN 650 MG RE SUPP: 650.0000 mg | RECTAL | Status: DC | PRN

## 2019-06-08 MED ORDER — FENTANYL CITRATE (PF) 100 MCG/2ML IJ SOLN
50.0000 ug | INTRAMUSCULAR | Status: AC | PRN
  Administered 2019-06-08: 100 ug via INTRAVENOUS
  Administered 2019-06-08 (×2): 50 ug via INTRAVENOUS

## 2019-06-08 MED ORDER — HEPARIN SOD (PORK) LOCK FLUSH 100 UNIT/ML IV SOLN
INTRAVENOUS | Status: AC
  Filled 2019-06-08: qty 5

## 2019-06-08 MED ORDER — CEFAZOLIN SODIUM-DEXTROSE 2-4 GM/100ML-% IV SOLN
INTRAVENOUS | Status: AC
  Filled 2019-06-08: qty 100

## 2019-06-08 MED ORDER — ONDANSETRON HCL 4 MG/2ML IJ SOLN
INTRAMUSCULAR | Status: AC
  Filled 2019-06-08: qty 2

## 2019-06-08 MED ORDER — MEPERIDINE HCL 25 MG/ML IJ SOLN: 6.2500 mg | INTRAMUSCULAR | Status: DC | PRN

## 2019-06-08 MED ORDER — GABAPENTIN 300 MG PO CAPS
ORAL_CAPSULE | ORAL | Status: AC
  Filled 2019-06-08: qty 1

## 2019-06-08 MED ORDER — ACETAMINOPHEN 325 MG PO TABS: 650.0000 mg | ORAL_TABLET | ORAL | Status: DC | PRN

## 2019-06-08 MED FILL — HYDROCODON-APAP 5-325: 5-325 | 3 days supply | Qty: 20 | Fill #0

## 2019-06-08 SURGICAL SUPPLY — 43 items
ADH SKN CLS APL DERMABOND .7 (GAUZE/BANDAGES/DRESSINGS) ×1
APL PRP STRL LF DISP 70% ISPRP (MISCELLANEOUS) ×1
BAG DECANTER FOR FLEXI CONT (MISCELLANEOUS) ×2 IMPLANT
BLADE HEX COATED 2.75 (ELECTRODE) ×2 IMPLANT
BLADE SURG 15 STRL LF DISP TIS (BLADE) ×1 IMPLANT
BLADE SURG 15 STRL SS (BLADE) ×2
CHLORAPREP W/TINT 26 (MISCELLANEOUS) ×2 IMPLANT
COVER BACK TABLE REUSABLE LG (DRAPES) ×2 IMPLANT
COVER MAYO STAND REUSABLE (DRAPES) ×2 IMPLANT
COVER PROBE 5X48 (MISCELLANEOUS) ×2
COVER WAND RF STERILE (DRAPES) IMPLANT
DERMABOND ADVANCED (GAUZE/BANDAGES/DRESSINGS) ×1
DERMABOND ADVANCED .7 DNX12 (GAUZE/BANDAGES/DRESSINGS) ×1 IMPLANT
DRAPE C-ARM 42X72 X-RAY (DRAPES) ×2 IMPLANT
DRAPE LAPAROSCOPIC ABDOMINAL (DRAPES) ×2 IMPLANT
DRAPE UTILITY XL STRL (DRAPES) ×2 IMPLANT
DRSG TEGADERM 2-3/8X2-3/4 SM (GAUZE/BANDAGES/DRESSINGS) IMPLANT
DRSG TEGADERM 4X4.75 (GAUZE/BANDAGES/DRESSINGS) IMPLANT
ELECT REM PT RETURN 9FT ADLT (ELECTROSURGICAL) ×2
ELECTRODE REM PT RTRN 9FT ADLT (ELECTROSURGICAL) ×1 IMPLANT
GLOVE BIO SURGEON STRL SZ7 (GLOVE) ×1 IMPLANT
GLOVE BIOGEL PI IND STRL 7.5 (GLOVE) IMPLANT
GLOVE BIOGEL PI INDICATOR 7.5 (GLOVE) ×2
GLOVE SS BIOGEL STRL SZ 7 (GLOVE) ×1 IMPLANT
GLOVE SUPERSENSE BIOGEL SZ 7 (GLOVE) ×1
GOWN STRL REUS W/ TWL LRG LVL3 (GOWN DISPOSABLE) ×1 IMPLANT
GOWN STRL REUS W/ TWL XL LVL3 (GOWN DISPOSABLE) ×1 IMPLANT
GOWN STRL REUS W/TWL LRG LVL3 (GOWN DISPOSABLE) ×2
GOWN STRL REUS W/TWL XL LVL3 (GOWN DISPOSABLE) ×2
KIT CVR 48X5XPRB PLUP LF (MISCELLANEOUS) ×1 IMPLANT
KIT PORT POWER 8FR ISP CVUE (Port) ×2 IMPLANT
NDL HYPO 25X1 1.5 SAFETY (NEEDLE) ×1 IMPLANT
NEEDLE HYPO 22GX1.5 SAFETY (NEEDLE) ×2 IMPLANT
NEEDLE HYPO 25X1 1.5 SAFETY (NEEDLE) ×2 IMPLANT
PACK BASIN DAY SURGERY FS (CUSTOM PROCEDURE TRAY) ×2 IMPLANT
PENCIL BUTTON HOLSTER BLD 10FT (ELECTRODE) ×2 IMPLANT
SLEEVE SCD COMPRESS KNEE MED (MISCELLANEOUS) ×2 IMPLANT
SUT MNCRL AB 4-0 PS2 18 (SUTURE) ×2 IMPLANT
SUT PROLENE 2 0 CT2 30 (SUTURE) ×2 IMPLANT
SUT VICRYL 3-0 CR8 SH (SUTURE) ×2 IMPLANT
SYR 10ML LL (SYRINGE) ×2 IMPLANT
SYR 5ML LUER SLIP (SYRINGE) ×2 IMPLANT
TOWEL GREEN STERILE FF (TOWEL DISPOSABLE) ×4 IMPLANT

## 2019-06-08 NOTE — Interval H&P Note (Signed)
History and Physical Interval Note:  06/08/2019 6:56 AM  Elizabeth Beasley  has presented today for surgery, with the diagnosis of HODGKINS LYMPHOMA.  The various methods of treatment have been discussed with the patient and family. After consideration of risks, benefits and other options for treatment, the patient has consented to  Procedure(s): INSERTION PORT-A-CATH WITH ULTRASOUND GUIDANCE (N/A) as a surgical intervention.  The patient's history has been reviewed, patient examined, no change in status, stable for surgery.  I have reviewed the patient's chart and labs.  Questions were answered to the patient's satisfaction.     [name]

## 2019-06-08 NOTE — Anesthesia Postprocedure Evaluation (Signed)
Anesthesia Post Note  Patient: Elizabeth Beasley  Procedure(s) Performed: INSERTION PORT-A-CATH WITH ULTRASOUND GUIDANCE (Right Chest)     Patient location during evaluation: PACU Anesthesia Type: General Level of consciousness: awake and alert and oriented Pain management: pain level controlled Vital Signs Assessment: post-procedure vital signs reviewed and stable Respiratory status: spontaneous breathing, nonlabored ventilation and respiratory function stable Cardiovascular status: blood pressure returned to baseline and stable Postop Assessment: no apparent nausea or vomiting Anesthetic complications: no    Last Vitals:  Vitals:   06/08/19 0845 06/08/19 0900  BP: (!) 90/56 99/68  Pulse: 90 95  Resp: 19 19  Temp:    SpO2: 97% 100%    Last Pain:  Vitals:   06/08/19 0900  TempSrc:   PainSc: 1                  Catie Chiao A.

## 2019-06-08 NOTE — Transfer of Care (Signed)
Immediate Anesthesia Transfer of Care Note  Patient: Elizabeth Beasley  Procedure(s) Performed: INSERTION PORT-A-CATH WITH ULTRASOUND GUIDANCE (Right Chest)  Patient Location: PACU  Anesthesia Type:General  Level of Consciousness: sedated  Airway & Oxygen Therapy: Patient Spontanous Breathing and Patient connected to face mask oxygen  Post-op Assessment: Report given to RN and Post -op Vital signs reviewed and stable  Post vital signs: Reviewed and stable  Last Vitals:  Vitals Value Taken Time  BP 111/61 06/08/19 0833  Temp    Pulse 91 06/08/19 0835  Resp 18 06/08/19 0836  SpO2 98 % 06/08/19 0835  Vitals shown include unvalidated device data.  Last Pain:  Vitals:   06/08/19 0635  TempSrc: Oral  PainSc: 1       Patients Stated Pain Goal: 1 ([id number])  Complications: No apparent anesthesia complications

## 2019-06-08 NOTE — Anesthesia Procedure Notes (Signed)
Procedure Name: LMA Insertion Date/Time: 06/08/2019 7:42 AM Performed by: Maryella Shivers, CRNA Pre-anesthesia Checklist: Patient identified, Emergency Drugs available, Suction available and Patient being monitored Patient Re-evaluated:Patient Re-evaluated prior to induction Oxygen Delivery Method: Circle system utilized Preoxygenation: Pre-oxygenation with 100% oxygen Induction Type: IV induction Ventilation: Mask ventilation without difficulty LMA: LMA inserted LMA Size: 4.0 Number of attempts: 1 Airway Equipment and Method: Bite block Placement Confirmation: positive ETCO2 Tube secured with: Tape Dental Injury: Teeth and Oropharynx as per pre-operative assessment

## 2019-06-08 NOTE — Discharge Instructions (Signed)
PORT-A-CATH: POST OP INSTRUCTIONS  Always review your discharge instruction sheet given to you by the facility where your surgery was performed.   1. A prescription for pain medication may be given to you upon discharge. Take your pain medication as prescribed, if needed. If narcotic pain medicine is not needed, then you make take acetaminophen (Tylenol) or ibuprofen (Advil) as needed. No Tylenol until 12:45pm, no ibuprofen until 2:45pm 2. Take your usually prescribed medications unless otherwise directed. 3. If you need a refill on your pain medication, please contact our office. All narcotic pain medicine now requires a paper prescription.  Phoned in and fax refills are no longer allowed by law.  Prescriptions will not be filled after 5 pm or on weekends.  4. You should follow a light diet for the remainder of the day after your procedure. 5. Most patients will experience some mild swelling and/or bruising in the area of the incision. It may take several days to resolve. 6. It is common to experience some constipation if taking pain medication after surgery. Increasing fluid intake and taking a stool softener (such as Colace) will usually help or prevent this problem from occurring. A mild laxative (Milk of Magnesia or Miralax) should be taken according to package directions if there are no bowel movements after 48 hours.  7. Unless discharge instructions indicate otherwise, you may remove your bandages 48 hours after surgery, and you may shower at that time. You may have steri-strips (small white skin tapes) in place directly over the incision.  These strips should be left on the skin for 7-10 days.  If your surgeon used Dermabond (skin glue) on the incision, you may shower in 24 hours.  The glue will flake off over the next 2-3 weeks.  8. If your port is left accessed at the end of surgery (needle left in port), the dressing cannot get wet and should only by changed by a healthcare professional.  When the port is no longer accessed (when the needle has been removed), follow step 7.   9. ACTIVITIES:  Limit activity involving your arms for the next 72 hours. Do no strenuous exercise or activity for 1 week. You may drive when you are no longer taking prescription pain medication, you can comfortably wear a seatbelt, and you can maneuver your car. 10.You may need to see your doctor in the office for a follow-up appointment.  Please       check with your doctor.  11.When you receive a new Port-a-Cath, you will get a product guide and        ID card.  Please keep them in case you need them.  WHEN TO CALL YOUR DOCTOR [phone]): 1. Fever over 101.0 2. Chills 3. Continued bleeding from incision 4. Increased redness and tenderness at the site 5. Shortness of breath, difficulty breathing   The clinic staff is available to answer your questions during regular business hours. Please dont hesitate to call and ask to speak to one of the nurses or medical assistants for clinical concerns. If you have a medical emergency, go to the nearest emergency room or call 911.  A surgeon from [facility] is always on call at the hospital.     For further information, please visit [email or web address]    Post Anesthesia Home Care Instructions  Activity: Get plenty of rest for the remainder of the day. A responsible individual must stay with you for 24 hours following the procedure.  For the  next 24 hours, DO NOT: -Drive a car -Paediatric nurse -Drink alcoholic beverages -Take any medication unless instructed by your physician -Make any legal decisions or sign important papers.  Meals: Start with liquid foods such as gelatin or soup. Progress to regular foods as tolerated. Avoid greasy, spicy, heavy foods. If nausea and/or vomiting occur, drink only clear liquids until the nausea and/or vomiting subsides. Call your physician if vomiting continues.  Special  Instructions/Symptoms: Your throat may feel dry or sore from the anesthesia or the breathing tube placed in your throat during surgery. If this causes discomfort, gargle with warm salt water. The discomfort should disappear within 24 hours.  If you had a scopolamine patch placed behind your ear for the management of post- operative nausea and/or vomiting:  1. The medication in the patch is effective for 72 hours, after which it should be removed.  Wrap patch in a tissue and discard in the trash. Wash hands thoroughly with soap and water. 2. You may remove the patch earlier than 72 hours if you experience unpleasant side effects which may include dry mouth, dizziness or visual disturbances. 3. Avoid touching the patch. Wash your hands with soap and water after contact with the patch.            Managing Your Pain After Surgery Without Opioids    Thank you for participating in our program to help patients manage their pain after surgery without opioids. This is part of our effort to provide you with the best care possible, without exposing you or your family to the risk that opioids pose.  What pain can I expect after surgery? You can expect to have some pain after surgery. This is normal. The pain is typically worse the day after surgery, and quickly begins to get better. Many studies have found that many patients are able to manage their pain after surgery with Over-the-Counter (OTC) medications such as Tylenol and Motrin. If you have a condition that does not allow you to take Tylenol or Motrin, notify your surgical team.  How will I manage my pain? The best strategy for controlling your pain after surgery is around the clock pain control with Tylenol (acetaminophen) and Motrin (ibuprofen or Advil). Alternating these medications with each other allows you to maximize your pain control. In addition to Tylenol and Motrin, you can use heating pads or ice packs on your incisions to  help reduce your pain.  How will I alternate your regular strength over-the-counter pain medication? You will take a dose of pain medication every three hours. ; Start by taking 650 mg of Tylenol (2 pills of 325 mg) ; 3 hours later take 600 mg of Motrin (3 pills of 200 mg) ; 3 hours after taking the Motrin take 650 mg of Tylenol ; 3 hours after that take 600 mg of Motrin.   - 1 -  See example - if your first dose of Tylenol is at 12:00 PM   12:00 PM Tylenol 650 mg (2 pills of 325 mg)  3:00 PM Motrin 600 mg (3 pills of 200 mg)  6:00 PM Tylenol 650 mg (2 pills of 325 mg)  9:00 PM Motrin 600 mg (3 pills of 200 mg)  Continue alternating every 3 hours   We recommend that you follow this schedule around-the-clock for at least 3 days after surgery, or until you feel that it is no longer needed. Use the table on the last page of this handout to keep track of  the medications you are taking. Important: Do not take more than 3000mg  of Tylenol or 3200mg  of Motrin in a 24-hour period. Do not take ibuprofen/Motrin if you have a history of bleeding stomach ulcers, severe kidney disease, &/or actively taking a blood thinner  What if I still have pain? If you have pain that is not controlled with the over-the-counter pain medications (Tylenol and Motrin or Advil) you might have what we call breakthrough pain. You will receive a prescription for a small amount of an opioid pain medication such as Oxycodone, Tramadol, or Tylenol with Codeine. Use these opioid pills in the first 24 hours after surgery if you have breakthrough pain. Do not take more than 1 pill every 4-6 hours.  If you still have uncontrolled pain after using all opioid pills, don't hesitate to call our staff using the number provided. We will help make sure you are managing your pain in the best way possible, and if necessary, we can provide a prescription for additional pain medication.   Day 1    Time  Name of Medication Number of  pills taken  Amount of Acetaminophen  Pain Level   Comments  AM PM       AM PM       AM PM       AM PM       AM PM       AM PM       AM PM       AM PM       Total Daily amount of Acetaminophen Do not take more than  3,000 mg per day      Day 2    Time  Name of Medication Number of pills taken  Amount of Acetaminophen  Pain Level   Comments  AM PM       AM PM       AM PM       AM PM       AM PM       AM PM       AM PM       AM PM       Total Daily amount of Acetaminophen Do not take more than  3,000 mg per day      Day 3    Time  Name of Medication Number of pills taken  Amount of Acetaminophen  Pain Level   Comments  AM PM       AM PM       AM PM       AM PM          AM PM       AM PM       AM PM       AM PM       Total Daily amount of Acetaminophen Do not take more than  3,000 mg per day      Day 4    Time  Name of Medication Number of pills taken  Amount of Acetaminophen  Pain Level   Comments  AM PM       AM PM       AM PM       AM PM       AM PM       AM PM       AM PM       AM PM       Total Daily amount of  Acetaminophen Do not take more than  3,000 mg per day      Day 5    Time  Name of Medication Number of pills taken  Amount of Acetaminophen  Pain Level   Comments  AM PM       AM PM       AM PM       AM PM       AM PM       AM PM       AM PM       AM PM       Total Daily amount of Acetaminophen Do not take more than  3,000 mg per day       Day 6    Time  Name of Medication Number of pills taken  Amount of Acetaminophen  Pain Level  Comments  AM PM       AM PM       AM PM       AM PM       AM PM       AM PM       AM PM       AM PM       Total Daily amount of Acetaminophen Do not take more than  3,000 mg per day      Day 7    Time  Name of Medication Number of pills taken  Amount of Acetaminophen  Pain Level   Comments  AM PM       AM PM       AM PM       AM PM       AM PM        AM PM       AM PM       AM PM       Total Daily amount of Acetaminophen Do not take more than  3,000 mg per day        For additional information about how and where to safely dispose of unused opioid medications - [employer]  Disclaimer: This document contains information and/or instructional materials adapted from [employer] for the typical patient with your condition. It does not replace medical advice from your health care provider because your experience may differ from that of the typical patient. Talk to your health care provider if you have any questions about this document, your condition or your treatment plan. Adapted from [employer]

## 2019-06-08 NOTE — Op Note (Signed)
Patient Name:           Elizabeth Beasley   Date of Surgery:        06/08/2019  Pre op Diagnosis:      Hodgkin's lymphoma  Post op Diagnosis:    Same  Procedure:                 Insertion of PowerPort [place] 8 French tunneled venous vascular access device                                      Use of fluoroscopy for guidance and positioning  Surgeon:                     Edsel Petrin. Dalbert Batman, M.D., FACS  Assistant:                      OR staff  Operative Indications:   This is a very pleasant 38 year old female, referred by Beatrice Lecher for evaluation of lymphadenopathy of the neck, which turns out to be Hodgkin's Lymphoma.  The patient noticed a lump in her right neck about 2 months ago.. It is not tender. She's had some night sweats and she's had about a 7 pound weight loss. No swallowing problems. No pulmonary problems. No gastrointestinal problems. She is also has a microcytic anemia and has been started on iron. She is a Sales promotion account executive [employer]       She had an ultrasound of the neck and then a CT scan of the neck the CT scan shows bilateral cervical lymphadenopathy. Larger on the right. There is a 3.0 cm and a 3.5 cm node on the right level IV and 5. Multiple smaller but additional abnormal lymph nodes. Left neck lymph nodes are present and levels 3 and 4 and supraclavicular. Mediastinal lymphadenopathies partially visualized with bulky right paratracheal nodes. Nothing else was noted in the neck or upper chest.       Recent open biopsy shows Hodgkins Lymphoma.     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 Biopsy shows Hodgkin's lymphoma and Dr. Irene Limbo requests port insertion to facilitate chemo. I discussed the indications, details techniques, and risk of the surgery with her. She's aware the risk of bleeding, infection, injury to adjacent organs. Pneumothorax, vascular injury arrhythmia, etc.  Operative Findings:       The  port and catheter were inserted through the right subclavian vein.  At the completion of the case the catheter was well positioned, had excellent blood return and flushed easily.  There was a large right paratracheal mass seen on C arm imaging initially and throughout the procedure.  This did not change during the procedure and presumably this is part of her mediastinal adenopathy.  There was no deformity of the catheter position noted on final imaging.  Procedure in Detail:          Following the induction of general LMA anesthesia the patient was positioned with a roll behind her shoulders and her arms tucked at her sides.  The neck and chest were prepped and draped in a sterile fashion.  Surgical timeout was performed.  Intravenous antibiotics were given.  0.5% Marcaine with epinephrine was used as a local infiltration anesthetic.      A right subclavian venipuncture was performed.  This required 3 attempts but on  the third attempt I got excellent blood return and the wire threaded easily.  C arm fluoroscopy showed that the wire was in the inferior vena cava.  A small  incision was made at the wire insertion site.  A transverse incision was made below the medial right clavicle.  A subcutaneous pocket Was created at the level of the pectoralis fascia.  Using a tunneling device I passed the catheter from the wire insertion site to the port pocket site.  Using the C arm I drew a template on the chest wall to guide proper positioning of the catheter so that it would be at the cavoatrial junction.  The catheter was cut 25.5 cm in length.  The catheter was secured to the port with the locking device and the port and catheter flushed with heparinized saline.  The port was sutured to the pectoralis fascia with 3 interrupted sutures of 2-0 Prolene.  The dilator and peel-away sheath assembly were inserted over the guidewire into the central venous circulation.  The dilator and wire were removed.  The catheter was  threaded.  The peel-away sheath was removed.  I had excellent blood return and the catheter flushed easily.  C-arm fluoroscopy showed the catheter to be well positioned with the catheter tip near the cavoatrial junction.  There did not appear to be any deformity along the course of the catheter.  The port and catheter were then flushed with concentrated heparin.      Hemostasis was excellent.  The incisions were closed with 3-0 Vicryl in the subcutaneous tissue and 4-0 Monocryl subcuticular sutures on the skin and Dermabond.  The patient tolerated the procedure well and was taken to PACU in stable condition.  EBL 20 cc.  Counts correct.  Complications none.  A chest x-ray is planned.   Addendum: I logged onto the PMP aware website and reviewed her prescription medication history   06/08/2019 8:28 AM

## 2019-06-08 NOTE — Anesthesia Preprocedure Evaluation (Addendum)
Anesthesia Evaluation  Patient identified by MRN, date of birth, ID band Patient awake    Reviewed: Allergy & Precautions, NPO status , Patient's Chart, lab work & pertinent test results  History of Anesthesia Complications (+) PONV and history of anesthetic complications  Airway Mallampati: II  TM Distance: >3 FB Neck ROM: Full    Dental no notable dental hx. (+) Teeth Intact   Pulmonary neg pulmonary ROS,    Pulmonary exam normal breath sounds clear to auscultation       Cardiovascular negative cardio ROS   Rhythm:Regular Rate:Normal     Neuro/Psych negative neurological ROS  negative psych ROS   GI/Hepatic negative GI ROS, Neg liver ROS,   Endo/Other  Obesity  Renal/GU negative Renal ROS  negative genitourinary   Musculoskeletal   Abdominal (+) + obese,   Peds  Hematology  (+) anemia , Hodgkin's lymphoma   Anesthesia Other Findings   Reproductive/Obstetrics                            Anesthesia Physical Anesthesia Plan  ASA: II  Anesthesia Plan: General   Post-op Pain Management:    Induction: Intravenous  PONV Risk Score and Plan: 4 or greater and Scopolamine patch - Pre-op, Midazolam, Ondansetron, Dexamethasone and Treatment may vary due to age or medical condition  Airway Management Planned: LMA  Additional Equipment:   Intra-op Plan:   Post-operative Plan: Extubation in OR  Informed Consent: I have reviewed the patients History and Physical, chart, labs and discussed the procedure including the risks, benefits and alternatives for the proposed anesthesia with the patient or authorized representative who has indicated his/her understanding and acceptance.     Dental advisory given  Plan Discussed with: CRNA and Surgeon  Anesthesia Plan Comments:         Anesthesia Quick Evaluation

## 2019-06-09 ENCOUNTER — Encounter (HOSPITAL_BASED_OUTPATIENT_CLINIC_OR_DEPARTMENT_OTHER): Payer: Self-pay | Admitting: General Surgery

## 2019-06-10 ENCOUNTER — Telehealth: Payer: Self-pay | Admitting: *Deleted

## 2019-06-10 NOTE — Telephone Encounter (Signed)
Received faxed message from Dewy Rose that patient had called to ask questions about chemotherapy and to clarify appointments for 9/25 and 9/29. Reviewed appt times with patient and advised that most questions will be answered during Patient Education tomorrow. Patient verbalized understanding.

## 2019-06-11 ENCOUNTER — Telehealth: Payer: Self-pay

## 2019-06-11 ENCOUNTER — Inpatient Hospital Stay: Payer: No Typology Code available for payment source

## 2019-06-11 ENCOUNTER — Other Ambulatory Visit: Payer: Self-pay

## 2019-06-11 ENCOUNTER — Other Ambulatory Visit: Payer: Self-pay | Admitting: *Deleted

## 2019-06-11 VITALS — BP 114/68 | HR 86 | Temp 98.3°F | Resp 18

## 2019-06-11 DIAGNOSIS — C8118 Nodular sclerosis classical Hodgkin lymphoma, lymph nodes of multiple sites: Secondary | ICD-10-CM | POA: Diagnosis not present

## 2019-06-11 DIAGNOSIS — D500 Iron deficiency anemia secondary to blood loss (chronic): Secondary | ICD-10-CM

## 2019-06-11 MED ORDER — ACETAMINOPHEN 325 MG PO TABS
ORAL_TABLET | ORAL | Status: AC
  Filled 2019-06-11: qty 2

## 2019-06-11 MED ORDER — SODIUM CHLORIDE 0.9 % IV SOLN
Freq: Once | INTRAVENOUS | Status: AC
  Administered 2019-06-11: 09:00:00 via INTRAVENOUS
  Filled 2019-06-11: qty 250

## 2019-06-11 MED ORDER — LIDOCAINE-PRILOCAINE 2.5-2.5 % EX CREA: 1.0000 "application " | TOPICAL_CREAM | CUTANEOUS | 1 refills | Status: DC | PRN

## 2019-06-11 MED ORDER — SODIUM CHLORIDE 0.9 % IV SOLN
750.0000 mg | Freq: Once | INTRAVENOUS | Status: AC
  Administered 2019-06-11: 750 mg via INTRAVENOUS
  Filled 2019-06-11: qty 15

## 2019-06-11 MED ORDER — ACETAMINOPHEN 325 MG PO TABS
650.0000 mg | ORAL_TABLET | Freq: Once | ORAL | Status: AC
  Administered 2019-06-11: 09:00:00 650 mg via ORAL

## 2019-06-11 MED ORDER — LORATADINE 10 MG PO TABS
10.0000 mg | ORAL_TABLET | Freq: Once | ORAL | Status: AC
  Administered 2019-06-11: 10 mg via ORAL

## 2019-06-11 MED ORDER — HEPARIN SOD (PORK) LOCK FLUSH 100 UNIT/ML IV SOLN
500.0000 [IU] | Freq: Once | INTRAVENOUS | Status: AC
  Administered 2019-06-11: 11:00:00 500 [IU] via INTRAVENOUS
  Filled 2019-06-11: qty 5

## 2019-06-11 MED ORDER — SODIUM CHLORIDE 0.9% FLUSH
10.0000 mL | INTRAVENOUS | Status: DC | PRN
  Administered 2019-06-11: 11:00:00 10 mL via INTRAVENOUS
  Filled 2019-06-11: qty 10

## 2019-06-11 MED ORDER — LORATADINE 10 MG PO TABS
ORAL_TABLET | ORAL | Status: AC
  Filled 2019-06-11: qty 1

## 2019-06-11 MED FILL — LIDOCAINE-PRILOCAINE CREAM: 2.5-2.5 | 10 days supply | Qty: 30 | Fill #0

## 2019-06-11 NOTE — Telephone Encounter (Signed)
Received TC from pt stating that she wanted to ask Dr Irene Limbo if it would be ok for her to start taking Vitamin C supplements to help with her iron absorption and to also boost her immune system or would it be harmful to her. I let her know that Dr Irene Limbo had left for the day but I would be sure to get this message to him and the nurse will follow up with her next week about her concern.

## 2019-06-11 NOTE — Progress Notes (Signed)
Rx sent to pharmacy per Dr.Kale

## 2019-06-11 NOTE — Patient Instructions (Signed)
Coronavirus (COVID-19) Are you at risk?  Are you at risk for the Coronavirus (COVID-19)?  To be considered HIGH RISK for Coronavirus (COVID-19), you have to meet the following criteria:  . Traveled to [place], [place], [place], [place] or [place]; or in the [place] to [place], [place], [place], or [place]; and have fever, cough, and shortness of breath within the last 2 weeks of travel OR . Been in close contact with a person diagnosed with COVID-19 within the last 2 weeks and have fever, cough, and shortness of breath . IF YOU DO NOT MEET THESE CRITERIA, YOU ARE CONSIDERED LOW RISK FOR COVID-19.  What to do if you are HIGH RISK for COVID-19?  . If you are having a medical emergency, call 911. . Seek medical care right away. Before you go to a doctor's office, urgent care or emergency department, call ahead and tell them about your recent travel, contact with someone diagnosed with COVID-19, and your symptoms. You should receive instructions from your physician's office regarding next steps of care.  . When you arrive at healthcare provider, tell the healthcare staff immediately you have returned from visiting [place], [place], [place], [place] or [place]; or traveled in the [place] to [place], [place], [place], or [place]; in the last two weeks or you have been in close contact with a person diagnosed with COVID-19 in the last 2 weeks.   . Tell the health care staff about your symptoms: fever, cough and shortness of breath. . After you have been seen by a medical provider, you will be either: o Tested for (COVID-19) and discharged home on quarantine except to seek medical care if symptoms worsen, and asked to  - Stay home and avoid contact with others until you get your results (4-5 days)  - Avoid travel on public transportation if possible (such as bus, train, or airplane) or o Sent to the Emergency Department by EMS for evaluation, COVID-19 testing, and possible  admission depending on your condition and test results.  What to do if you are LOW RISK for COVID-19?  Reduce your risk of any infection by using the same precautions used for avoiding the common cold or flu:  . Wash your hands often with soap and warm water for at least 20 seconds.  If soap and water are not readily available, use an alcohol-based hand sanitizer with at least 60% alcohol.  . If coughing or sneezing, cover your mouth and nose by coughing or sneezing into the elbow areas of your shirt or coat, into a tissue or into your sleeve (not your hands). . Avoid shaking hands with others and consider head nods or verbal greetings only. . Avoid touching your eyes, nose, or mouth with unwashed hands.  . Avoid close contact with people who are sick. . Avoid places or events with large numbers of people in one location, like concerts or sporting events. . Carefully consider travel plans you have or are making. . If you are planning any travel outside or inside the [place], visit the CDC's Travelers' Health webpage for the latest health notices. . If you have some symptoms but not all symptoms, continue to monitor at home and seek medical attention if your symptoms worsen. . If you are having a medical emergency, call 911.   ADDITIONAL HEALTHCARE OPTIONS FOR PATIENTS  [facility] / e-Visit: [email or web address]         [facility]: [phone]  [place]   Ur[facility] Care: [facility]: [phone]   Ferric carboxymaltose injection What is this medicine? FERRIC CARBOXYMALTOSE (ferr-ik car-box-ee-mol-toes) is an iron complex. Iron is used to make healthy red blood cells, which carry oxygen and nutrients throughout the body. This medicine is used to treat anemia in people with chronic kidney disease or people who cannot take iron by mouth. This medicine may be used for other purposes;  ask your health care provider or pharmacist if you have questions. COMMON BRAND NAME(S): Injectafer What should I tell my health care provider before I take this medicine? They need to know if you have any of these conditions:  high levels of iron in the blood  liver disease  an unusual or allergic reaction to iron, other medicines, foods, dyes, or preservatives  pregnant or trying to get pregnant  breast-feeding How should I use this medicine? This medicine is for infusion into a vein. It is given by a health care professional in a hospital or clinic setting. Talk to your pediatrician regarding the use of this medicine in children. Special care may be needed. Overdosage: If you think you have taken too much of this medicine contact a poison control center or emergency room at once. NOTE: This medicine is only for you. Do not share this medicine with others. What if I miss a dose? It is important not to miss your dose. Call your doctor or health care professional if you are unable to keep an appointment. What may interact with this medicine? Do not take this medicine with any of the following medications:  deferoxamine  dimercaprol  other iron products This list may not describe all possible interactions. Give your health care provider a list of all the medicines, herbs, non-prescription drugs, or dietary supplements you use. Also tell them if you smoke, drink alcohol, or use illegal drugs. Some items may interact with your medicine. What should I watch for while using this medicine? Visit your doctor or health care professional regularly. Tell your doctor if your symptoms do not start to get better or if they get worse. You may need blood work done while you are taking this medicine. You may need to follow a special diet. Talk to your doctor. Foods that contain iron include: whole grains/cereals, dried fruits, beans, or peas, leafy green vegetables, and organ meats (liver,  kidney). What side effects may I notice from receiving this medicine? Side effects that you should report to your doctor or health care professional as soon as possible:  allergic reactions like skin rash, itching or hives, swelling of the face, lips, or tongue  dizziness  facial flushing Side effects that usually do not require medical attention (report to your doctor or health care professional if they continue or are bothersome):  changes in taste  constipation  headache  nausea, vomiting  pain, redness, or irritation at site where injected This list may not describe all possible side effects. Call your doctor for medical advice about side effects. You may report side effects to FDA at 1-800-FDA-1088. Where should I keep my medicine? This drug is given in a hospital or clinic and will not be stored at home. NOTE: This sheet is a summary. It may not cover all possible information. If you have questions about this medicine, talk to your doctor, pharmacist, or health care provider.  2020 Elsevier/Gold Standard (2016-10-17 09:40:29)

## 2019-06-14 ENCOUNTER — Encounter: Payer: Self-pay | Admitting: Hematology

## 2019-06-14 ENCOUNTER — Ambulatory Visit (HOSPITAL_COMMUNITY)
Admission: RE | Admit: 2019-06-14 | Discharge: 2019-06-14 | Disposition: A | Discharge status: Home / Self Care | Payer: No Typology Code available for payment source | Source: Ambulatory Visit | Attending: Hematology | Admitting: Hematology

## 2019-06-14 ENCOUNTER — Other Ambulatory Visit: Payer: Self-pay | Admitting: Hematology

## 2019-06-14 ENCOUNTER — Other Ambulatory Visit: Payer: Self-pay

## 2019-06-14 ENCOUNTER — Telehealth: Payer: Self-pay | Admitting: Hematology

## 2019-06-14 ENCOUNTER — Telehealth: Payer: Self-pay

## 2019-06-14 DIAGNOSIS — Z7189 Other specified counseling: Secondary | ICD-10-CM | POA: Insufficient documentation

## 2019-06-14 DIAGNOSIS — D500 Iron deficiency anemia secondary to blood loss (chronic): Secondary | ICD-10-CM | POA: Insufficient documentation

## 2019-06-14 DIAGNOSIS — C8118 Nodular sclerosis classical Hodgkin lymphoma, lymph nodes of multiple sites: Secondary | ICD-10-CM | POA: Insufficient documentation

## 2019-06-14 DIAGNOSIS — C8111 Nodular sclerosis classical Hodgkin lymphoma, lymph nodes of head, face, and neck: Secondary | ICD-10-CM

## 2019-06-14 LAB — GLUCOSE, CAPILLARY: Glucose-Capillary: 96 mg/dL (ref 70–99)

## 2019-06-14 MED ORDER — FLUDEOXYGLUCOSE F - 18 (FDG) INJECTION
9.6000 | Freq: Once | INTRAVENOUS | Status: AC | PRN
  Administered 2019-06-14: 07:00:00 9.6 via INTRAVENOUS

## 2019-06-14 MED ORDER — PROCHLORPERAZINE MALEATE 10 MG PO TABS: 10.0000 mg | ORAL_TABLET | Freq: Four times a day (QID) | ORAL | 1 refills | Status: DC | PRN

## 2019-06-14 MED ORDER — LORAZEPAM 0.5 MG PO TABS: 0.5000 mg | ORAL_TABLET | Freq: Four times a day (QID) | ORAL | 0 refills | Status: DC | PRN

## 2019-06-14 MED ORDER — DEXAMETHASONE 4 MG PO TABS: ORAL_TABLET | ORAL | 1 refills | Status: DC

## 2019-06-14 MED ORDER — ONDANSETRON HCL 8 MG PO TABS: 8.0000 mg | ORAL_TABLET | Freq: Two times a day (BID) | ORAL | 1 refills | Status: DC | PRN

## 2019-06-14 MED FILL — PROCHLORPERAZINE 10 MG TAB: 10 | 30 days supply | Qty: 30 | Fill #0

## 2019-06-14 MED FILL — LORazepam 0.5 MG TABS: 0.5 | 7 days supply | Qty: 30 | Fill #0

## 2019-06-14 MED FILL — DEXAMETHASONE 4 MG TABLET: 4 | 9 days supply | Qty: 30 | Fill #0

## 2019-06-14 MED FILL — ONDANSETRON HCL 8 MG TABLET: 8 | 15 days supply | Qty: 30 | Fill #0

## 2019-06-14 NOTE — Progress Notes (Signed)
Met w/ pt and introduced myself as one of the Arboriculturist.  Unfortunately there aren't any foundations offering copay assistance for her Dx.  I went over the [employer], gave her the income requirement and an expense sheet.  She informed me that she exceeds the limit so she doesn't qualify for the grant at this time.  She has mine and [employer] card for any questions or concerns she may have in the future.

## 2019-06-14 NOTE — Telephone Encounter (Signed)
Called patient and let her know that per Dr. Irene Limbo it is ok to start taking Vitamin C. Patient verbalized understanding.

## 2019-06-14 NOTE — Telephone Encounter (Signed)
R/s appt per 9/28 sch message - pt aware of new apt date and time

## 2019-06-14 NOTE — Progress Notes (Signed)
START ON PATHWAY REGIMEN - Lymphoma and CLL     A cycle is every 28 days:     Doxorubicin      Dacarbazine      Vinblastine      Bleomycin   **Always confirm dose/schedule in your pharmacy ordering system**  Patient Characteristics: Classical Hodgkin Lymphoma, First Line, Stage III / IV, Age < 60 Disease Type: Not Applicable Disease Type: Not Applicable Disease Type: Classical Hodgkin Lymphoma Line of therapy: First Line [place] Stage: IIIA Age: < 60 Intent of Therapy: Curative Intent, Discussed with Patient 

## 2019-06-15 ENCOUNTER — Inpatient Hospital Stay: Payer: No Typology Code available for payment source

## 2019-06-15 ENCOUNTER — Encounter: Payer: Self-pay | Admitting: Certified Nurse Midwife

## 2019-06-15 ENCOUNTER — Ambulatory Visit (INDEPENDENT_AMBULATORY_CARE_PROVIDER_SITE_OTHER): Payer: No Typology Code available for payment source | Admitting: Certified Nurse Midwife

## 2019-06-15 VITALS — BP 120/71 | HR 119 | Resp 16 | Ht 64.0 in | Wt 194.0 lb

## 2019-06-15 DIAGNOSIS — Z30433 Encounter for removal and reinsertion of intrauterine contraceptive device: Secondary | ICD-10-CM

## 2019-06-15 DIAGNOSIS — Z3202 Encounter for pregnancy test, result negative: Secondary | ICD-10-CM

## 2019-06-15 LAB — POCT URINE PREGNANCY: Preg Test, Ur: NEGATIVE

## 2019-06-15 MED ORDER — LEVONORGESTREL 20 MCG/24HR IU IUD
INTRAUTERINE_SYSTEM | Freq: Once | INTRAUTERINE | Status: AC
  Administered 2019-06-15: 15:00:00 via INTRAUTERINE

## 2019-06-15 NOTE — Patient Instructions (Signed)
IUD PLACEMENT POST-PROCEDURE INSTRUCTIONS  1. You may take Ibuprofen, Aleve or Tylenol for pain if needed.  Cramping should resolve within in 24 hours.  2. You may have a small amount of spotting.  You should wear a mini pad for the next few days.  3. You may have intercourse after 24 hours.  If you using this for birth control, it is effective immediately.  4. You need to call if you have any pelvic pain, fever, heavy bleeding or foul smelling vaginal discharge.  Irregular bleeding is common the first several months after having an IUD placed. You do not need to call for this reason unless you are concerned.  5. Shower or bathe as normal  6. You should have a follow-up appointment in 4-8 weeks for a re-check to make sure you are not having any problems.  Levonorgestrel intrauterine device (IUD) What is this medicine? LEVONORGESTREL IUD (LEE voe nor jes trel) is a contraceptive (birth control) device. The device is placed inside the uterus by a healthcare professional. It is used to prevent pregnancy. This device can also be used to treat heavy bleeding that occurs during your period. This medicine may be used for other purposes; ask your health care provider or pharmacist if you have questions. COMMON BRAND NAME(S): Kyleena, LILETTA, Mirena, Skyla What should I tell my health care provider before I take this medicine? They need to know if you have any of these conditions:  abnormal Pap smear  cancer of the breast, uterus, or cervix  diabetes  endometritis  genital or pelvic infection now or in the past  have more than one sexual partner or your partner has more than one partner  heart disease  history of an ectopic or tubal pregnancy  immune system problems  IUD in place  liver disease or tumor  problems with blood clots or take blood-thinners  seizures  use intravenous drugs  uterus of unusual shape  vaginal bleeding that has not been explained  an unusual or  allergic reaction to levonorgestrel, other hormones, silicone, or polyethylene, medicines, foods, dyes, or preservatives  pregnant or trying to get pregnant  breast-feeding How should I use this medicine? This device is placed inside the uterus by a health care professional. Talk to your pediatrician regarding the use of this medicine in children. Special care may be needed. Overdosage: If you think you have taken too much of this medicine contact a poison control center or emergency room at once. NOTE: This medicine is only for you. Do not share this medicine with others. What if I miss a dose? This does not apply. Depending on the brand of device you have inserted, the device will need to be replaced every 3 to 6 years if you wish to continue using this type of birth control. What may interact with this medicine? Do not take this medicine with any of the following medications:  amprenavir  bosentan  fosamprenavir This medicine may also interact with the following medications:  aprepitant  armodafinil  barbiturate medicines for inducing sleep or treating seizures  bexarotene  boceprevir  griseofulvin  medicines to treat seizures like carbamazepine, ethotoin, felbamate, oxcarbazepine, phenytoin, topiramate  modafinil  pioglitazone  rifabutin  rifampin  rifapentine  some medicines to treat HIV infection like atazanavir, efavirenz, indinavir, lopinavir, nelfinavir, tipranavir, ritonavir  St. John's wort  warfarin This list may not describe all possible interactions. Give your health care provider a list of all the medicines, herbs, non-prescription drugs, or dietary   supplements you use. Also tell them if you smoke, drink alcohol, or use illegal drugs. Some items may interact with your medicine. What should I watch for while using this medicine? Visit your doctor or health care professional for regular check ups. See your doctor if you or your partner has sexual  contact with others, becomes HIV positive, or gets a sexual transmitted disease. This product does not protect you against HIV infection (AIDS) or other sexually transmitted diseases. You can check the placement of the IUD yourself by reaching up to the top of your vagina with clean fingers to feel the threads. Do not pull on the threads. It is a good habit to check placement after each menstrual period. Call your doctor right away if you feel more of the IUD than just the threads or if you cannot feel the threads at all. The IUD may come out by itself. You may become pregnant if the device comes out. If you notice that the IUD has come out use a backup birth control method like condoms and call your health care provider. Using tampons will not change the position of the IUD and are okay to use during your period. This IUD can be safely scanned with magnetic resonance imaging (MRI) only under specific conditions. Before you have an MRI, tell your healthcare provider that you have an IUD in place, and which type of IUD you have in place. What side effects may I notice from receiving this medicine? Side effects that you should report to your doctor or health care professional as soon as possible:  allergic reactions like skin rash, itching or hives, swelling of the face, lips, or tongue  fever, flu-like symptoms  genital sores  high blood pressure  no menstrual period for 6 weeks during use  pain, swelling, warmth in the leg  pelvic pain or tenderness  severe or sudden headache  signs of pregnancy  stomach cramping  sudden shortness of breath  trouble with balance, talking, or walking  unusual vaginal bleeding, discharge  yellowing of the eyes or skin Side effects that usually do not require medical attention (report to your doctor or health care professional if they continue or are bothersome):  acne  breast pain  change in sex drive or performance  changes in  weight  cramping, dizziness, or faintness while the device is being inserted  headache  irregular menstrual bleeding within first 3 to 6 months of use  nausea This list may not describe all possible side effects. Call your doctor for medical advice about side effects. You may report side effects to FDA at 1-800-FDA-1088. Where should I keep my medicine? This does not apply. NOTE: This sheet is a summary. It may not cover all possible information. If you have questions about this medicine, talk to your doctor, pharmacist, or health care provider.  2020 Elsevier/Gold Standard (2018-07-14 13:22:01)  

## 2019-06-15 NOTE — Progress Notes (Addendum)
   GYNECOLOGY CLINIC PROCEDURE NOTE  [name] is a 38 y.o. [phone] here for Mirena IUD insertion. Had paragard placed in 2018. Recently diagnosed with Hodgkin's Lymphoma this month and plans to start chemo next Friday. Patient reports she wants hormonal IUD to regulate bleeding.  Last pap smear was on 07/2017 and was normal.  IUD Removal - paragard removal  Patient was in the dorsal lithotomy position, normal external genitalia was noted.  A speculum was placed in the patient's vagina, normal discharge was noted, no lesions. The multiparous cervix was visualized, no lesions, no abnormal discharge. The strings of the IUD were grasped and pulled using ring forceps. The IUD was removed in its entirety. Patient tolerated the procedure well.    IUD Insertion Procedure Note- Mirena  Patient identified, informed consent performed.  Discussed risks of irregular bleeding, cramping, infection, malpositioning or misplacement of the IUD outside the uterus which may require further procedure such as laparoscopy. Time out was performed.  Urine pregnancy test negative.  Speculum placed in the vagina.  Cervix visualized.  Cleaned with Betadine x 2.  Grasped anteriorly with a single tooth tenaculum.  Uterus sounded to 8 cm.  Mirena IUD placed per manufacturer's recommendations.  Strings trimmed to 3 cm. Tenaculum was removed, good hemostasis noted.  Patient tolerated procedure well.   Patient was given post-procedure instructions.  She was advised to be have backup contraception for one week.  Patient was also asked to check IUD strings periodically and follow up in 4 weeks for IUD check.  Labs and Imaging Results for orders placed or performed in visit on 06/15/19 (from the past 24 hour(s))  POCT urine pregnancy     Status: Normal   Collection Time: 06/15/19  2:15 PM  Result Value Ref Range   Preg Test, Ur Negative Negative    Assessment & Plan:  1. Encounter for IUD removal and reinsertion -  UPT negative - Paragard IUD removed and replaced with Mirena  - Follow up in 4 weeks for string check or as needed  - POCT urine pregnancy - levonorgestrel (MIRENA) 20 MCG/24HR IUD  Elizabeth Beasley, [facility] 06/15/2019 2:46 PM

## 2019-06-17 ENCOUNTER — Inpatient Hospital Stay: Payer: No Typology Code available for payment source | Admitting: Hematology

## 2019-06-18 ENCOUNTER — Telehealth: Payer: Self-pay | Admitting: *Deleted

## 2019-06-18 ENCOUNTER — Ambulatory Visit (INDEPENDENT_AMBULATORY_CARE_PROVIDER_SITE_OTHER): Payer: No Typology Code available for payment source | Admitting: Family Medicine

## 2019-06-18 ENCOUNTER — Other Ambulatory Visit (HOSPITAL_COMMUNITY)
Admission: RE | Admit: 2019-06-18 | Discharge: 2019-06-18 | Disposition: A | Discharge status: Home / Self Care | Payer: No Typology Code available for payment source | Source: Ambulatory Visit | Attending: Hematology | Admitting: Hematology

## 2019-06-18 ENCOUNTER — Telehealth: Payer: Self-pay | Admitting: Family Medicine

## 2019-06-18 DIAGNOSIS — Z20828 Contact with and (suspected) exposure to other viral communicable diseases: Secondary | ICD-10-CM | POA: Diagnosis not present

## 2019-06-18 DIAGNOSIS — Z01812 Encounter for preprocedural laboratory examination: Secondary | ICD-10-CM | POA: Diagnosis not present

## 2019-06-18 DIAGNOSIS — Z23 Encounter for immunization: Secondary | ICD-10-CM

## 2019-06-18 NOTE — Telephone Encounter (Signed)
Diagnosed with hodgkins lymphoma and getting ready to start chemo. Read needs pneumonia vaccine 2 weeks prior to chemo. Can she get this at least so has 1 week ahead of it. Which pneumo vaccine does she need the 23 or the 13. Im having her come in at 1:15 for injection.

## 2019-06-18 NOTE — Telephone Encounter (Signed)
Patient called - she had IV Iron on 9/25. Mirena IUD new on 9/29. She wondered if either of these are related to iron. Having following symptoms: Intermittant *palpitations* - she describes feeling her heart beat hard, but still regular. Occasional episodic shortness of breath earlier in week, but now better. Information given to Dr.Kale.  Contacted patient with Dr. Grier Mitts response: symptoms could be from her anemia itself... acute reaction can happen with  IV iron but usually while receiving iron or soon after infusion if its a reaction. Patient verbalized understanding of information

## 2019-06-18 NOTE — Progress Notes (Signed)
   Subjective:    Patient ID: Elizabeth Beasley, female    DOB: July 22, 1981, 38 y.o.   MRN: [id number]  HPI Patient here for pneumoccal 13 injection. Given left deltoid without complications. KG LPN   Review of Systems     Objective:   Physical Exam        Assessment & Plan:    Agree with documentation as above.   Beatrice Lecher, MD

## 2019-06-18 NOTE — Telephone Encounter (Signed)
Give prenvar 13 first and then after she completes chemo and it has been at least 8 weeks since the first injection then we will plan to give the Pneumo 23 as well

## 2019-06-18 NOTE — Telephone Encounter (Signed)
Noted  

## 2019-06-20 LAB — NOVEL CORONAVIRUS, NAA (HOSP ORDER, SEND-OUT TO REF LAB; TAT 18-24 HRS): SARS-CoV-2, NAA: NOT DETECTED

## 2019-06-21 ENCOUNTER — Ambulatory Visit (HOSPITAL_COMMUNITY)
Admission: RE | Admit: 2019-06-21 | Discharge: 2019-06-21 | Disposition: A | Discharge status: Home / Self Care | Payer: No Typology Code available for payment source | Source: Ambulatory Visit | Attending: Hematology | Admitting: Hematology

## 2019-06-21 ENCOUNTER — Other Ambulatory Visit: Payer: Self-pay

## 2019-06-21 DIAGNOSIS — C8118 Nodular sclerosis classical Hodgkin lymphoma, lymph nodes of multiple sites: Secondary | ICD-10-CM | POA: Insufficient documentation

## 2019-06-21 DIAGNOSIS — D500 Iron deficiency anemia secondary to blood loss (chronic): Secondary | ICD-10-CM | POA: Insufficient documentation

## 2019-06-21 LAB — PULMONARY FUNCTION TEST
DL/VA % pred: 158 %
DL/VA: 7.06 ml/min/mmHg/L
DLCO cor % pred: 115 %
DLCO cor: 26.28 ml/min/mmHg
DLCO unc % pred: 93 %
DLCO unc: 21.37 ml/min/mmHg
FEF 25-75 Post: 2.47 L/sec
FEF 25-75 Pre: 2.5 L/sec
FEF2575-%Change-Post: 0 %
FEF2575-%Pred-Post: 82 %
FEF2575-%Pred-Pre: 82 %
FEV1-%Change-Post: 0 %
FEV1-%Pred-Post: 87 %
FEV1-%Pred-Pre: 86 %
FEV1-Post: 2.35 L
FEV1-Pre: 2.34 L
FEV1FVC-%Change-Post: 4 %
FEV1FVC-%Pred-Pre: 99 %
FEV6-%Change-Post: -4 %
FEV6-%Pred-Post: 84 %
FEV6-%Pred-Pre: 88 %
FEV6-Post: 2.69 L
FEV6-Pre: 2.81 L
FEV6FVC-%Pred-Post: 101 %
FEV6FVC-%Pred-Pre: 101 %
FVC-%Change-Post: -3 %
FVC-%Pred-Post: 83 %
FVC-%Pred-Pre: 86 %
FVC-Post: 2.7 L
FVC-Pre: 2.81 L
Post FEV1/FVC ratio: 87 %
Post FEV6/FVC ratio: 100 %
Pre FEV1/FVC ratio: 83 %
Pre FEV6/FVC Ratio: 100 %
RV % pred: 96 %
RV: 1.53 L
TLC % pred: 81 %
TLC: 4.24 L

## 2019-06-21 MED ORDER — ALBUTEROL SULFATE (2.5 MG/3ML) 0.083% IN NEBU
2.5000 mg | INHALATION_SOLUTION | Freq: Once | RESPIRATORY_TRACT | Status: AC
  Administered 2019-06-21: 2.5 mg via RESPIRATORY_TRACT

## 2019-06-22 ENCOUNTER — Encounter: Payer: Self-pay | Admitting: Family Medicine

## 2019-06-23 ENCOUNTER — Telehealth: Payer: Self-pay | Admitting: *Deleted

## 2019-06-23 ENCOUNTER — Other Ambulatory Visit: Payer: Self-pay | Admitting: Hematology

## 2019-06-23 MED ORDER — LIDOCAINE-PRILOCAINE 2.5-2.5 % EX KIT: PACK | Freq: Once | CUTANEOUS | 2 refills | Status: AC

## 2019-06-23 MED FILL — LIDOCAINE-PRILOCAINE CREAM: 2.5-2.5 | 10 days supply | Qty: 30 | Fill #0

## 2019-06-23 NOTE — Telephone Encounter (Signed)
Patient called to review her medications prior to receiving chemo on Friday - reviewed Dexamethasone and PRN meds for nausea. Patient requested EMLA creme prior to chemo on Friday. Dr. Irene Limbo given message.

## 2019-06-24 ENCOUNTER — Other Ambulatory Visit: Payer: Self-pay | Admitting: *Deleted

## 2019-06-24 ENCOUNTER — Telehealth: Payer: Self-pay | Admitting: *Deleted

## 2019-06-24 DIAGNOSIS — C8111 Nodular sclerosis classical Hodgkin lymphoma, lymph nodes of head, face, and neck: Secondary | ICD-10-CM

## 2019-06-24 NOTE — Progress Notes (Signed)
HEMATOLOGY/ONCOLOGY CLINIC NOTE  Date of Service: 06/24/2019  Patient Care Team: [name], MD as PCP - General (Family Medicine)  CHIEF COMPLAINTS/PURPOSE OF CONSULTATION:  Microcytic Anemia Newly diagnosed classical hodgkins lymphoma  HISTORY OF PRESENTING ILLNESS:   Elizabeth Beasley is a wonderful 38 y.o. female who has been referred to [place] by Dr Madilyn Fireman for evaluation and management of microcytic anemia/cervical lymphadenopathy. Pt is accompanied today by her husband, Derrick via phone. The pt reports that she is doing well overall.  The pt reports that she has a history of iron deficiency but has never had a transfusion. Pt has had two pregnancies and has two children, ages 23 and 50. There was a concern for anemia with her second pregnancy so she was given an erythropoietin shot. She has had a copper IUD for about 2 years and her menstrual cycle has been slightly longer since getting the IUD. She has been taking 28 mg TID PO iron since June. Pt has no known medication allergies and has had a previous removal of Bartholin's gland cyst. Pt has had no known contact with chemicals either through work or hobbies. She has no history of asthma or heavy smoke/aerosol exposure.   Pt did not notice any cervical lymphadenopathy in June. In late July, early August she began to experience extreme fatigue and could begin to feel small lumps in her neck. Her fatigue is improved with taking her PO Iron TID. She has experienced a couple of episodes of night sweats, with the latest occurring in late August. Pt has also had <10 lbs loss in 2 months. She has not had any rashes but is experiencing itching in her palms, broader itching after hot showers.   Pt is currently working from home. She has had her seasonal flu shot. Pt is not planning on having more children and is not concerned about fertility. Pt's husband would like to take a drive to the [facility] next month. Pt would not  want any blood product transfusions, as she is a Jehovah's Witness. She is comfortable with IV iron infusions.   INTERVAL HISTORY  The patient's last visit with [place] was on 06/01/2019. The pt reports that she is doing well overall.  The pt reports that she is feeling better. After using IV iron her energy levels seems to have improved. She is no longer taking Zyrtec and has switched to Allegra.  Of note since the patient's last visit, pt has had a Pulmonary Function Test completed on 06/21/2019 with results revealing "Minimal airway obstruction is present. The lack of response to bronchodilators may indicate a refractory state. Prolonged use may be of benefit. The increased diffusing capacity would be consistent with asthma or a cardiovascular process such as left heart failure or shunting."  Of note since the patient's last visit, pt has had NUCLEAR MEDICINE PET SKULL BASE TO THIGH completed on 06/14/2019 with results revealing "Hypermetabolic lymph nodes in the neck, chest and abdomen, consistent with the given history of Hodgkin lymphoma (Deauville 5)."  Of note since the patient's last visit, pt has had an Echocardiogram Complete completed on 06/04/2019 with results revealing "1. Left ventricular ejection fraction, by visual estimation, is 60 to 65%. The left ventricle has normal function. Normal left ventricular size. There is no left ventricular hypertrophy.2. Global right ventricle has normal systolic function.The right ventricular size is normal. No increase in right ventricular wall thickness. 3. Left atrial size was normal. 4. Right atrial size was  normal. 5. The mitral valve is normal in structure. Trace mitral valve regurgitation. No evidence of mitral stenosis. 6. The tricuspid valve is normal in structure. Tricuspid valve regurgitation was not visualized by color flow Doppler. 7. The aortic valve is normal in structure. Aortic valve regurgitation was not visualized by color flow Doppler.  Structurally normal aortic valve, with no evidence of sclerosis or stenosis. 8. The pulmonic valve was normal in structure. Pulmonic valve regurgitation is not visualized by color flow Doppler. 9. The inferior vena cava is normal in size with greater than 50% respiratory variability, suggesting right atrial pressure of 3 mmHg. 10. The average left ventricular global longitudinal strain is -18.7 %."  Lab results today (06/25/19) of CBC w/diff and CMP is as follows: all values are WNL except for WBC at 11.3, Hemoglobin at 10.2, HCT at 33.1, MCH at 25.7, RDW at 28.6, and  Neutro Abs at 8.7. CMP: Potassium at 3.3, Glucose, Bld at 108, Total Protein at 8.4, Albumin at 2.7, and AST at 14. PENDING IRON AND TIBC  PENDING FERRITIN  On review of systems, pt reports shortness of breath and denies chest pain, chest discomfort, leg swelling, coughing, phlegm production, abdominal pain, constipation and any other symptoms.   On PMHx the pt reports C-section, Bartholin's Gland Cyst removal. On Social Hx the pt reports non smoker, no alcohol use. On Family Hx the pt reports father passed from colon cancer, mother has colon polyps.   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   Pt is scheduled for surgery       SURGICAL HISTORY: Past Surgical History:  Procedure Laterality Date   CESAREAN SECTION  2010   MASS EXCISION Right 05/21/2019   Procedure: EXCISION OF RIGHT DEEP NECK LYMPH NODE;  Surgeon: Fanny Skates, MD;  Location: [place];  Service: General;  Laterality: Right;   PORTACATH PLACEMENT Right 06/08/2019   Procedure: INSERTION PORT-A-CATH WITH ULTRASOUND GUIDANCE;  Surgeon: Fanny Skates, MD;  Location: [place];  Service:  General;  Laterality: Right;   URETHRAL CYST REMOVAL N/A 08/30/2016   Procedure: BARTHOLIN'S GLAND CYST EXCISION;  Surgeon: Ardis Hughs, MD;  Location: [facility];  Service: Urology;  Laterality: N/A;   v back     2012    SOCIAL HISTORY: Social History   Socioeconomic History   Marital status: Married    Spouse name: Derrick   Number of children: 2   Years of education: Assoc   Highest education level: Not on file  Occupational History   Occupation: CMA    Employer: [employer] strain: Not on file   Food insecurity    Worry: Not on file    Inability: Not on file   Transportation needs    Medical: Not on file    Non-medical: Not on file  Tobacco Use   Smoking status: Never Smoker   Smokeless tobacco: Never Used  Substance and Sexual Activity   Alcohol use: Not Currently    Comment: ocassional   Drug use: No   Sexual activity: Yes    Partners: Male    Birth control/protection: I.U.D.  Lifestyle   Physical activity    Days per week:  Not on file    Minutes per session: Not on file   Stress: Not on file  Relationships   Social connections    Talks on phone: Not on file    Gets together: Not on file    Attends religious service: Not on file    Active member of club or organization: Not on file    Attends meetings of clubs or organizations: Not on file    Relationship status: Not on file   Intimate partner violence    Fear of current or ex partner: Not on file    Emotionally abused: Not on file    Physically abused: Not on file    Forced sexual activity: Not on file  Other Topics Concern   Not on file  Social History Narrative   Regular exercise. No regular caffeine.    FAMILY HISTORY: Family History  Problem Relation Age of Onset   Colon cancer Father 50   Colon polyps Mother    Hypertension Mother    Diabetes Paternal Grandmother    Stroke Paternal Grandmother    Alcohol  abuse Paternal Uncle    Colon cancer Paternal Aunt 45   Esophageal cancer Neg Hx    Rectal cancer Neg Hx    Stomach cancer Neg Hx     ALLERGIES:  has No Known Allergies.  MEDICATIONS:  Current Outpatient Medications  Medication Sig Dispense Refill   cetirizine (ZYRTEC) 5 MG tablet Take 5 mg by mouth daily as needed for allergies.     dexamethasone (DECADRON) 4 MG tablet Take 2 tablets by mouth once a day on the day after chemotherapy and then take 2 tablets two times a day for 2 days. Take with food. (Patient not taking: Reported on 06/15/2019) 30 tablet 1   LORazepam (ATIVAN) 0.5 MG tablet Take 1 tablet (0.5 mg total) by mouth every 6 (six) hours as needed (Nausea or vomiting). (Patient not taking: Reported on 06/15/2019) 30 tablet 0   ondansetron (ZOFRAN) 8 MG tablet Take 1 tablet (8 mg total) by mouth 2 (two) times daily as needed. Start on the third day after chemotherapy. (Patient not taking: Reported on 06/15/2019) 30 tablet 1   prochlorperazine (COMPAZINE) 10 MG tablet Take 1 tablet (10 mg total) by mouth every 6 (six) hours as needed (Nausea or vomiting). (Patient not taking: Reported on 06/15/2019) 30 tablet 1   No current facility-administered medications for this visit.     REVIEW OF SYSTEMS:   A 10+ POINT REVIEW OF SYSTEMS WAS OBTAINED including neurology, dermatology, psychiatry, cardiac, respiratory, lymph, extremities, GI, GU, Musculoskeletal, constitutional, breasts, reproductive, HEENT.  All pertinent positives are noted in the HPI.  All others are negative.    PHYSICAL EXAMINATION: ECOG FS:1 - Symptomatic but completely ambulatory  There were no vitals filed for this visit. Wt Readings from Last 3 Encounters:  06/15/19 194 lb (88 kg)  06/08/19 192 lb 14.4 oz (87.5 kg)  06/01/19 193 lb (87.5 kg)   There is no height or weight on file to calculate BMI.    GENERAL:alert, in no acute distress and comfortable SKIN: no acute rashes, no significant lesions EYES:  conjunctiva are pink and non-injected, sclera anicteric OROPHARYNX: MMM, no exudates, no oropharyngeal erythema or ulceration NECK: supple, no JVD LYMPH:  no palpable lymphadenopathy in the cervical, axillary or inguinal regions LUNGS: clear to auscultation b/l with normal respiratory effort HEART: regular rate & rhythm ABDOMEN:  normoactive bowel sounds , non tender, not distended. Extremity: no  pedal edema PSYCH: alert & oriented x 3 with fluent speech NEURO: no focal motor/sensory deficits  LABORATORY DATA:  I have reviewed the data as listed  . CBC Latest Ref Rng & Units 06/01/2019 05/20/2019 05/14/2019  WBC 4.0 - 10.5 K/uL 12.5(H) 9.0 9.9  Hemoglobin 12.0 - 15.0 g/dL 8.6(L) 9.2(L) 8.7(L)  Hematocrit 36.0 - 46.0 % 29.4(L) 31.8(L) 28.9(L)  Platelets 150 - 400 K/uL 480(H) 547(H) 511(H)    . CMP Latest Ref Rng & Units 06/01/2019 05/20/2019 05/14/2019  Glucose 70 - 99 mg/dL 96 88 108(H)  BUN 6 - 20 mg/dL 8 6 7   Creatinine 0.44 - 1.00 mg/dL 0.70 0.67 0.66  Sodium 135 - 145 mmol/L 140 137 139  Potassium 3.5 - 5.1 mmol/L 3.6 3.4(L) 4.4  Chloride 98 - 111 mmol/L 104 101 101  CO2 22 - 32 mmol/L 27 24 28   Calcium 8.9 - 10.3 mg/dL 9.4 9.2 9.3  Total Protein 6.5 - 8.1 g/dL 8.6(H) - 8.2(H)  Total Bilirubin 0.3 - 1.2 mg/dL 0.7 - 0.8  Alkaline Phos 38 - 126 U/L 110 - -  AST 15 - 41 U/L 15 - 11  ALT 0 - 44 U/L 31 - 9   . Lab Results  Component Value Date   IRON 8 (L) 06/01/2019   TIBC 233 (L) 06/01/2019   IRONPCTSAT 4 (L) 06/01/2019   (Iron and TIBC)  Lab Results  Component Value Date   FERRITIN 117 06/01/2019  06/04/2019 ([id number])  ECHOCARDIOGRAM  06/21/2019 Pulmonary Function Test   06/14/2019 ([id number] CHL)  NUCLEAR MEDICINE PET SKULL BASE TO THIGH    06/04/2019  05/21/2019 [id number]) Lymph Node Biopsy    RADIOGRAPHIC STUDIES: I have personally reviewed the radiological images as listed and agreed with the findings in the report. Nm Pet Image Initial (pi) Skull  Base To Thigh  Result Date: 06/14/2019 CLINICAL DATA:  Initial treatment strategy for Hodgkin lymphoma. EXAM: NUCLEAR MEDICINE PET SKULL BASE TO THIGH TECHNIQUE: 9.6 mCi F-18 FDG was injected intravenously. Full-ring PET imaging was performed from the skull base to thigh after the radiotracer. CT data was obtained and used for attenuation correction and anatomic localization. Fasting blood glucose: 96 mg/dl COMPARISON:  CT chest abdomen pelvis 05/19/2019. FINDINGS: Mediastinal blood pool activity: SUV max 2.9 Liver activity: SUV max 3.6 NECK: There are hypermetabolic lymph nodes in the neck bilaterally. Index right level-II lymph node measures 2.0 cm in short axis (4/32) with an SUV max of 13.8. Incidental CT findings: None. CHEST: Hypermetabolic supraclavicular node are seen bilaterally. Index lymph node on the right measures 1.6 cm in short axis (4/41) with an SUV max 12.5. Bulky hypermetabolic mediastinal adenopathy is seen with an index high right paratracheal lymph node measuring 2.3 cm (4/53) with an SUV max 10.7. Bihilar hypermetabolic lymph nodes are seen with an index SUV max on the left of 11.5. Hypermetabolic subpectoral lymph nodes on the left have an SUV max of 10.6 and measure up to 11 mm in short axis (4/52). Hypermetabolic prepericardiac lymph node measures 7 mm (4/93) with an SUV max of 4.9. No hypermetabolic pulmonary nodules. Incidental CT findings: Right IJ Port-A-Cath terminates at the SVC RA junction. No pericardial or pleural effusion. ABDOMEN/PELVIS: No abnormal hypermetabolism in the liver, adrenal glands, spleen or pancreas. Mild hypermetabolism is seen in the periportal region with an index 7 mm lymph node (4/100) and SUV max of 5.3. No additional hypermetabolic lymph nodes in the abdomen or pelvis. Incidental CT findings: Low-attenuation lesions in the liver  measure up to 13 mm and are too small to characterize but cysts are likely. Liver, gallbladder, adrenal glands, kidneys, spleen,  pancreas, stomach and bowel are otherwise unremarkable. Intrauterine contraceptive device is again seen in the lower uterine segment. Bladder wall thickening may be due to underdistention. SKELETON: No abnormal osseous hypermetabolism. Incidental CT findings: None. IMPRESSION: Hypermetabolic lymph nodes in the neck, chest and abdomen, consistent with the given history of Hodgkin lymphoma (Deauville 5). Electronically Signed   By: Lorin Picket M.D.   On: 06/14/2019 08:57   Dg Chest Port 1 View  Result Date: 06/08/2019 CLINICAL DATA:  Right-sided Port-A-Cath placement. Hodgkin's lymphoma EXAM: PORTABLE CHEST 1 VIEW COMPARISON:  CT 05/19/2019 FINDINGS: Right-sided power port tip at the SVC RA junction. No pneumothorax. Massive mediastinal adenopathy as seen previously. Lung parenchyma is clear. IMPRESSION: Power port well positioned with tip at the SVC RA junction. No complicating feature. Massive mediastinal adenopathy again demonstrated. Electronically Signed   By: Nelson Chimes M.D.   On: 06/08/2019 09:29   Dg Fluoro Guide Cv Line-no Report  Result Date: 06/08/2019 Fluoroscopy was utilized by the requesting physician.  No radiographic interpretation.    ASSESSMENT & PLAN:   1) Newly diagnosed Classical Hodgkins lymphoma Likely stage IIA based on imaging thus far. 2) Microcytic Anemia due to anemia of chronic disease and Iron deficiency 3) Jehovas Witness  PLAN A&P: -Discussed pt labwork today, 06/24/19;  all values are WNL except for WBC at 11.3, Hemoglobin at 10.2, HCT at 33.1, MCH at 25.7, RDW at 28.6, and  Neutro Abs at 8.7. CMP: Potassium at 3.3, Glucose, Bld at 108, Total Protein at 8.4, Albumin at 2.7, and AST at 14. PENDING IRON AND TIBC  PENDING FERRITIN  -Discussed that the IV Iron has improved her iron levels. -The gas exchange functions were normal. However inhalers might be useful if shortness of breath becomes a issue. Discussed that echocardiogram was normal. -PET scan  revealed that there were enlarged lymph nodes in neck, collar bone, center of chest 2.3 cm in size, lymph's near center of lung. Small lymph below the diaphragm 1/3 of an inch. Due to this the staging is now 3A. -Advised that we could do a D-Dimmer for shortness of breath and increased heart rate. Advised that his could be caused by anemia but if shortness of breath persists then D-Dimmer will be ordered.  -Today we will proceed with treatment -The steroids that are provided with treatment will help with any asthma related issues as well. -Recommended  eating more potassium rich foods to increase potassium levels and continue to eat iron rich foods. -Discussed that 3A means that lymph's on both sides of diaphragm. The lymph below the diaphragm is what determined this to be Stage 3A -Repeat PET scan after two or three cycles to determine if treatment can be adjusted .    FOLLOW UP: RTC with Dr Irene Limbo with labs in 1week for toxicity check Please schedule next remain C1 and C2 of ABVD treatment MD visit with C1D15  Portflush with every lab appointment. Labs with every treatment and MD visit   The total time spent in the appt was 25 minutes and more than 50% was on counseling and direct patient cares.  All of the patient's questions were answered with apparent satisfaction. The patient knows to call the clinic with any problems, questions or concerns.   Sullivan Lone MD [place] AAHIVMS [facility] Hematology/Oncology Physician [place]  (Office):  [phone] (Work cell):  [phone] (Fax):           [phone]  06/24/2019 8:45 PM I, Scot Dock, am acting as a scribe for Dr. Sullivan Lone.   .I have reviewed the above documentation for accuracy and completeness, and I agree with the above. Brunetta Genera MD

## 2019-06-24 NOTE — Telephone Encounter (Signed)
Opened in error

## 2019-06-25 ENCOUNTER — Inpatient Hospital Stay: Payer: No Typology Code available for payment source | Attending: Hematology

## 2019-06-25 ENCOUNTER — Inpatient Hospital Stay (HOSPITAL_BASED_OUTPATIENT_CLINIC_OR_DEPARTMENT_OTHER): Payer: No Typology Code available for payment source | Admitting: Hematology

## 2019-06-25 ENCOUNTER — Inpatient Hospital Stay: Payer: No Typology Code available for payment source

## 2019-06-25 ENCOUNTER — Other Ambulatory Visit: Payer: Self-pay

## 2019-06-25 ENCOUNTER — Telehealth: Payer: Self-pay | Admitting: Hematology

## 2019-06-25 VITALS — HR 100

## 2019-06-25 VITALS — BP 136/82 | HR 120 | Temp 98.2°F | Resp 18 | Ht 64.0 in | Wt 194.4 lb

## 2019-06-25 DIAGNOSIS — Z823 Family history of stroke: Secondary | ICD-10-CM | POA: Diagnosis not present

## 2019-06-25 DIAGNOSIS — Z7189 Other specified counseling: Secondary | ICD-10-CM

## 2019-06-25 DIAGNOSIS — E876 Hypokalemia: Secondary | ICD-10-CM

## 2019-06-25 DIAGNOSIS — C8118 Nodular sclerosis classical Hodgkin lymphoma, lymph nodes of multiple sites: Secondary | ICD-10-CM

## 2019-06-25 DIAGNOSIS — Z800 Family history of malignant neoplasm of digestive organs: Secondary | ICD-10-CM | POA: Diagnosis not present

## 2019-06-25 DIAGNOSIS — Z833 Family history of diabetes mellitus: Secondary | ICD-10-CM | POA: Diagnosis not present

## 2019-06-25 DIAGNOSIS — D509 Iron deficiency anemia, unspecified: Secondary | ICD-10-CM | POA: Diagnosis not present

## 2019-06-25 DIAGNOSIS — C8111 Nodular sclerosis classical Hodgkin lymphoma, lymph nodes of head, face, and neck: Secondary | ICD-10-CM

## 2019-06-25 DIAGNOSIS — J45909 Unspecified asthma, uncomplicated: Secondary | ICD-10-CM | POA: Insufficient documentation

## 2019-06-25 DIAGNOSIS — Z79899 Other long term (current) drug therapy: Secondary | ICD-10-CM | POA: Diagnosis not present

## 2019-06-25 DIAGNOSIS — Z5111 Encounter for antineoplastic chemotherapy: Secondary | ICD-10-CM | POA: Diagnosis present

## 2019-06-25 DIAGNOSIS — D500 Iron deficiency anemia secondary to blood loss (chronic): Secondary | ICD-10-CM

## 2019-06-25 DIAGNOSIS — Z95828 Presence of other vascular implants and grafts: Secondary | ICD-10-CM | POA: Insufficient documentation

## 2019-06-25 DIAGNOSIS — Z8249 Family history of ischemic heart disease and other diseases of the circulatory system: Secondary | ICD-10-CM | POA: Insufficient documentation

## 2019-06-25 LAB — CMP (CANCER CENTER ONLY)
ALT: 26 U/L (ref 0–44)
AST: 14 U/L — ABNORMAL LOW (ref 15–41)
Albumin: 2.7 g/dL — ABNORMAL LOW (ref 3.5–5.0)
Alkaline Phosphatase: 120 U/L (ref 38–126)
Anion gap: 11 (ref 5–15)
BUN: 8 mg/dL (ref 6–20)
CO2: 24 mmol/L (ref 22–32)
Calcium: 9.4 mg/dL (ref 8.9–10.3)
Chloride: 105 mmol/L (ref 98–111)
Creatinine: 0.67 mg/dL (ref 0.44–1.00)
GFR, Est AFR Am: >60 mL/min (ref >60)
GFR, Estimated: >60 mL/min (ref >60)
Glucose, Bld: 108 mg/dL — ABNORMAL HIGH (ref 70–99)
Potassium: 3.3 mmol/L — ABNORMAL LOW (ref 3.5–5.1)
Sodium: 140 mmol/L (ref 135–145)
Total Bilirubin: 0.5 mg/dL (ref 0.3–1.2)
Total Protein: 8.4 g/dL — ABNORMAL HIGH (ref 6.5–8.1)

## 2019-06-25 LAB — CBC WITH DIFFERENTIAL (CANCER CENTER ONLY)
Abs Immature Granulocytes: 0.05 10*3/uL (ref 0.00–0.07)
Basophils Absolute: 0 10*3/uL (ref 0.0–0.1)
Basophils Relative: 0 %
Eosinophils Absolute: 0.1 10*3/uL (ref 0.0–0.5)
Eosinophils Relative: 1 %
HCT: 33.1 % — ABNORMAL LOW (ref 36.0–46.0)
Hemoglobin: 10.2 g/dL — ABNORMAL LOW (ref 12.0–15.0)
Immature Granulocytes: 0 %
Lymphocytes Relative: 15 %
Lymphs Abs: 1.7 10*3/uL (ref 0.7–4.0)
MCH: 25.7 pg — ABNORMAL LOW (ref 26.0–34.0)
MCHC: 30.8 g/dL (ref 30.0–36.0)
MCV: 83.4 fL (ref 80.0–100.0)
Monocytes Absolute: 0.7 10*3/uL (ref 0.1–1.0)
Monocytes Relative: 6 %
Neutro Abs: 8.7 10*3/uL — ABNORMAL HIGH (ref 1.7–7.7)
Neutrophils Relative %: 78 %
Platelet Count: 391 10*3/uL (ref 150–400)
RBC: 3.97 MIL/uL (ref 3.87–5.11)
RDW: 28.6 % — ABNORMAL HIGH (ref 11.5–15.5)
WBC Count: 11.3 10*3/uL — ABNORMAL HIGH (ref 4.0–10.5)
nRBC: 0 % (ref 0.0–0.2)

## 2019-06-25 LAB — IRON AND TIBC
Iron: 36 ug/dL — ABNORMAL LOW (ref 41–142)
Saturation Ratios: 23 % (ref 21–57)
TIBC: 158 ug/dL — ABNORMAL LOW (ref 236–444)
UIBC: 121 ug/dL (ref 120–384)

## 2019-06-25 LAB — FERRITIN: Ferritin: 3023 ng/mL — ABNORMAL HIGH (ref 11–307)

## 2019-06-25 MED ORDER — SODIUM CHLORIDE 0.9 % IV SOLN
Freq: Once | INTRAVENOUS | Status: AC
  Administered 2019-06-25: 10:00:00 via INTRAVENOUS
  Filled 2019-06-25: qty 250

## 2019-06-25 MED ORDER — SODIUM CHLORIDE 0.9 % IV SOLN
Freq: Once | INTRAVENOUS | Status: AC
  Administered 2019-06-25: 11:00:00 via INTRAVENOUS
  Filled 2019-06-25: qty 5

## 2019-06-25 MED ORDER — SODIUM CHLORIDE 0.9 % IV SOLN
375.0000 mg/m2 | Freq: Once | INTRAVENOUS | Status: AC
  Administered 2019-06-25: 750 mg via INTRAVENOUS
  Filled 2019-06-25: qty 75

## 2019-06-25 MED ORDER — VINBLASTINE SULFATE CHEMO INJECTION 1 MG/ML
6.0000 mg/m2 | Freq: Once | INTRAVENOUS | Status: AC
  Administered 2019-06-25: 12 mg via INTRAVENOUS
  Filled 2019-06-25: qty 12

## 2019-06-25 MED ORDER — SODIUM CHLORIDE 0.9% FLUSH
10.0000 mL | INTRAVENOUS | Status: DC | PRN
  Administered 2019-06-25: 10 mL
  Filled 2019-06-25: qty 10

## 2019-06-25 MED ORDER — LORAZEPAM 2 MG/ML IJ SOLN
INTRAMUSCULAR | Status: AC
  Filled 2019-06-25: qty 1

## 2019-06-25 MED ORDER — POTASSIUM CHLORIDE CRYS ER 20 MEQ PO TBCR: 40.0000 meq | EXTENDED_RELEASE_TABLET | Freq: Once | ORAL | Status: DC

## 2019-06-25 MED ORDER — DOXORUBICIN HCL CHEMO IV INJECTION 2 MG/ML
25.0000 mg/m2 | Freq: Once | INTRAVENOUS | Status: AC
  Administered 2019-06-25: 12:00:00 50 mg via INTRAVENOUS
  Filled 2019-06-25: qty 25

## 2019-06-25 MED ORDER — PALONOSETRON HCL INJECTION 0.25 MG/5ML
0.2500 mg | Freq: Once | INTRAVENOUS | Status: AC
  Administered 2019-06-25: 0.25 mg via INTRAVENOUS

## 2019-06-25 MED ORDER — PALONOSETRON HCL INJECTION 0.25 MG/5ML
INTRAVENOUS | Status: AC
  Filled 2019-06-25: qty 5

## 2019-06-25 MED ORDER — HEPARIN SOD (PORK) LOCK FLUSH 100 UNIT/ML IV SOLN
500.0000 [IU] | Freq: Once | INTRAVENOUS | Status: AC | PRN
  Administered 2019-06-25: 500 [IU]
  Filled 2019-06-25: qty 5

## 2019-06-25 MED ORDER — SODIUM CHLORIDE 0.9% FLUSH
10.0000 mL | Freq: Once | INTRAVENOUS | Status: AC
  Administered 2019-06-25: 10 mL
  Filled 2019-06-25: qty 10

## 2019-06-25 MED ORDER — POTASSIUM CHLORIDE CRYS ER 20 MEQ PO TBCR: 20.0000 meq | EXTENDED_RELEASE_TABLET | Freq: Two times a day (BID) | ORAL | 1 refills | Status: DC

## 2019-06-25 MED ORDER — LORAZEPAM 2 MG/ML IJ SOLN
0.5000 mg | Freq: Once | INTRAMUSCULAR | Status: AC
  Administered 2019-06-25: 0.5 mg via INTRAVENOUS

## 2019-06-25 MED ORDER — SODIUM CHLORIDE 0.9 % IV SOLN
10.0000 [IU]/m2 | Freq: Once | INTRAVENOUS | Status: AC
  Administered 2019-06-25: 20 [IU] via INTRAVENOUS
  Filled 2019-06-25: qty 6.67

## 2019-06-25 MED FILL — POTASSIUM CHLORIDE CRYS ER: 20 | 30 days supply | Qty: 60 | Fill #0

## 2019-06-25 NOTE — Telephone Encounter (Signed)
Scheduled appointments for 10/16 per los, awaiting further instructions from provider to schedule the rest her appointments. Patient is aware of appointments next week.

## 2019-06-25 NOTE — Patient Instructions (Signed)
[facility] Discharge Instructions for Patients Receiving Chemotherapy  Today you received the following chemotherapy agents Doxorubicin, Vinblastine, Bleomycin, and DTIC  To help prevent nausea and vomiting after your treatment, we encourage you to take your nausea medication as prescribed by MD.  **DO NOT TAKE ZOFRAN FOR 3 DAYS AFTER TREATMENT**   If you develop nausea and vomiting that is not controlled by your nausea medication, call the clinic.   BELOW ARE SYMPTOMS THAT SHOULD BE REPORTED IMMEDIATELY:  *FEVER GREATER THAN 100.5 F  *CHILLS WITH OR WITHOUT FEVER  NAUSEA AND VOMITING THAT IS NOT CONTROLLED WITH YOUR NAUSEA MEDICATION  *UNUSUAL SHORTNESS OF BREATH  *UNUSUAL BRUISING OR BLEEDING  TENDERNESS IN MOUTH AND THROAT WITH OR WITHOUT PRESENCE OF ULCERS  *URINARY PROBLEMS  *BOWEL PROBLEMS  UNUSUAL RASH Items with * indicate a potential emergency and should be followed up as soon as possible.  Feel free to call the clinic should you have any questions or concerns. The clinic phone number is [phone].  Please show the [facility] at check-in to the Emergency Department and triage nurse.   Doxorubicin injection What is this medicine? DOXORUBICIN (dox oh ROO bi sin) is a chemotherapy drug. It is used to treat many kinds of cancer like leukemia, lymphoma, neuroblastoma, sarcoma, and Wilms' tumor. It is also used to treat bladder cancer, breast cancer, lung cancer, ovarian cancer, stomach cancer, and thyroid cancer. This medicine may be used for other purposes; ask your health care provider or pharmacist if you have questions. COMMON BRAND NAME(S): Adriamycin, Adriamycin PFS, Adriamycin RDF, Rubex What should I tell my health care provider before I take this medicine? They need to know if you have any of these conditions:  heart disease  history of low blood counts caused by a medicine  liver disease  recent or ongoing radiation  therapy  an unusual or allergic reaction to doxorubicin, other chemotherapy agents, other medicines, foods, dyes, or preservatives  pregnant or trying to get pregnant  breast-feeding How should I use this medicine? This drug is given as an infusion into a vein. It is administered in a hospital or clinic by a specially trained health care professional. If you have pain, swelling, burning or any unusual feeling around the site of your injection, tell your health care professional right away. Talk to your pediatrician regarding the use of this medicine in children. Special care may be needed. Overdosage: If you think you have taken too much of this medicine contact a poison control center or emergency room at once. NOTE: This medicine is only for you. Do not share this medicine with others. What if I miss a dose? It is important not to miss your dose. Call your doctor or health care professional if you are unable to keep an appointment. What may interact with this medicine? This medicine may interact with the following medications:  6-mercaptopurine  paclitaxel  phenytoin  St. John's Wort  trastuzumab  verapamil This list may not describe all possible interactions. Give your health care provider a list of all the medicines, herbs, non-prescription drugs, or dietary supplements you use. Also tell them if you smoke, drink alcohol, or use illegal drugs. Some items may interact with your medicine. What should I watch for while using this medicine? This drug may make you feel generally unwell. This is not uncommon, as chemotherapy can affect healthy cells as well as cancer cells. Report any side effects. Continue your course of treatment even though  you feel ill unless your doctor tells you to stop. There is a maximum amount of this medicine you should receive throughout your life. The amount depends on the medical condition being treated and your overall health. Your doctor will watch how  much of this medicine you receive in your lifetime. Tell your doctor if you have taken this medicine before. You may need blood work done while you are taking this medicine. Your urine may turn red for a few days after your dose. This is not blood. If your urine is dark or brown, call your doctor. In some cases, you may be given additional medicines to help with side effects. Follow all directions for their use. Call your doctor or health care professional for advice if you get a fever, chills or sore throat, or other symptoms of a cold or flu. Do not treat yourself. This drug decreases your body's ability to fight infections. Try to avoid being around people who are sick. This medicine may increase your risk to bruise or bleed. Call your doctor or health care professional if you notice any unusual bleeding. Talk to your doctor about your risk of cancer. You may be more at risk for certain types of cancers if you take this medicine. Do not become pregnant while taking this medicine or for 6 months after stopping it. Women should inform their doctor if they wish to become pregnant or think they might be pregnant. Men should not father a child while taking this medicine and for 6 months after stopping it. There is a potential for serious side effects to an unborn child. Talk to your health care professional or pharmacist for more information. Do not breast-feed an infant while taking this medicine. This medicine has caused ovarian failure in some women and reduced sperm counts in some men This medicine may interfere with the ability to have a child. Talk with your doctor or health care professional if you are concerned about your fertility. This medicine may cause a decrease in Co-Enzyme Q-10. You should make sure that you get enough Co-Enzyme Q-10 while you are taking this medicine. Discuss the foods you eat and the vitamins you take with your health care professional. What side effects may I notice from  receiving this medicine? Side effects that you should report to your doctor or health care professional as soon as possible:  allergic reactions like skin rash, itching or hives, swelling of the face, lips, or tongue  breathing problems  chest pain  fast or irregular heartbeat  low blood counts - this medicine may decrease the number of white blood cells, red blood cells and platelets. You may be at increased risk for infections and bleeding.  pain, redness, or irritation at site where injected  signs of infection - fever or chills, cough, sore throat, pain or difficulty passing urine  signs of decreased platelets or bleeding - bruising, pinpoint red spots on the skin, black, tarry stools, blood in the urine  swelling of the ankles, feet, hands  tiredness  weakness Side effects that usually do not require medical attention (report to your doctor or health care professional if they continue or are bothersome):  diarrhea  hair loss  mouth sores  nail discoloration or damage  nausea  red colored urine  vomiting This list may not describe all possible side effects. Call your doctor for medical advice about side effects. You may report side effects to FDA at 1-800-FDA-1088. Where should I keep my medicine? This  drug is given in a hospital or clinic and will not be stored at home. NOTE: This sheet is a summary. It may not cover all possible information. If you have questions about this medicine, talk to your doctor, pharmacist, or health care provider.  2020 Elsevier/Gold Standard (2017-04-16 11:01:26)   Vinblastine injection What is this medicine? VINBLASTINE (vin BLAS teen) is a chemotherapy drug. It slows the growth of cancer cells. This medicine is used to treat many types of cancer like breast cancer, testicular cancer, Hodgkin's disease, non-Hodgkin's lymphoma, and sarcoma. This medicine may be used for other purposes; ask your health care provider or pharmacist if  you have questions. COMMON BRAND NAME(S): Velban What should I tell my health care provider before I take this medicine? They need to know if you have any of these conditions:  blood disorders  dental disease  gout  infection (especially a virus infection such as chickenpox, cold sores, or herpes)  liver disease  lung disease  nervous system disease  recent or ongoing radiation therapy  an unusual or allergic reaction to vinblastine, other chemotherapy agents, other medicines, foods, dyes, or preservatives  pregnant or trying to get pregnant  breast-feeding How should I use this medicine? This drug is given as an infusion into a vein. It is administered in a hospital or clinic by a specially trained health care professional. If you have pain, swelling, burning or any unusual feeling around the site of your injection, tell your health care professional right away. Talk to your pediatrician regarding the use of this medicine in children. While this drug may be prescribed for selected conditions, precautions do apply. Overdosage: If you think you have taken too much of this medicine contact a poison control center or emergency room at once. NOTE: This medicine is only for you. Do not share this medicine with others. What if I miss a dose? It is important not to miss your dose. Call your doctor or health care professional if you are unable to keep an appointment. What may interact with this medicine? Do not take this medicine with any of the following medications:  erythromycin  itraconazole  mibefradil  voriconazole This medicine may also interact with the following medications:  cyclosporine  fluconazole  ketoconazole  medicines for seizures like phenytoin  medicines to increase blood counts like filgrastim, pegfilgrastim, sargramostim  vaccines  verapamil Talk to your doctor or health care professional before taking any of these  medicines:  acetaminophen  aspirin  ibuprofen  ketoprofen  naproxen This list may not describe all possible interactions. Give your health care provider a list of all the medicines, herbs, non-prescription drugs, or dietary supplements you use. Also tell them if you smoke, drink alcohol, or use illegal drugs. Some items may interact with your medicine. What should I watch for while using this medicine? Your condition will be monitored carefully while you are receiving this medicine. You will need important blood work done while you are taking this medicine. This drug may make you feel generally unwell. This is not uncommon, as chemotherapy can affect healthy cells as well as cancer cells. Report any side effects. Continue your course of treatment even though you feel ill unless your doctor tells you to stop. In some cases, you may be given additional medicines to help with side effects. Follow all directions for their use. Call your doctor or health care professional for advice if you get a fever, chills or sore throat, or other symptoms of a  cold or flu. Do not treat yourself. This drug decreases your body's ability to fight infections. Try to avoid being around people who are sick. This medicine may increase your risk to bruise or bleed. Call your doctor or health care professional if you notice any unusual bleeding. Be careful brushing and flossing your teeth or using a toothpick because you may get an infection or bleed more easily. If you have any dental work done, tell your dentist you are receiving this medicine. Avoid taking products that contain aspirin, acetaminophen, ibuprofen, naproxen, or ketoprofen unless instructed by your doctor. These medicines may hide a fever. Do not become pregnant while taking this medicine. Women should inform their doctor if they wish to become pregnant or think they might be pregnant. There is a potential for serious side effects to an unborn child. Talk  to your health care professional or pharmacist for more information. Do not breast-feed an infant while taking this medicine. Men may have a lower sperm count while taking this medicine. Talk to your doctor if you plan to father a child. What side effects may I notice from receiving this medicine? Side effects that you should report to your doctor or health care professional as soon as possible:  allergic reactions like skin rash, itching or hives, swelling of the face, lips, or tongue  low blood counts - This drug may decrease the number of white blood cells, red blood cells and platelets. You may be at increased risk for infections and bleeding.  signs of infection - fever or chills, cough, sore throat, pain or difficulty passing urine  signs of decreased platelets or bleeding - bruising, pinpoint red spots on the skin, black, tarry stools, nosebleeds  signs of decreased red blood cells - unusually weak or tired, fainting spells, lightheadedness  breathing problems  changes in hearing  change in the amount of urine  chest pain  high blood pressure  mouth sores  nausea and vomiting  pain, swelling, redness or irritation at the injection site  pain, tingling, numbness in the hands or feet  problems with balance, dizziness  seizures Side effects that usually do not require medical attention (report to your doctor or health care professional if they continue or are bothersome):  constipation  hair loss  jaw pain  loss of appetite  sensitivity to light  stomach pain  tumor pain This list may not describe all possible side effects. Call your doctor for medical advice about side effects. You may report side effects to FDA at 1-800-FDA-1088. Where should I keep my medicine? This drug is given in a hospital or clinic and will not be stored at home. NOTE: This sheet is a summary. It may not cover all possible information. If you have questions about this medicine, talk  to your doctor, pharmacist, or health care provider.  2020 Elsevier/Gold Standard (2008-05-30 17:15:59)   Bleomycin injection What is this medicine? BLEOMYCIN (blee oh MYE sin) is a chemotherapy drug. It is used to treat many kinds of cancer like lymphoma, cervical cancer, head and neck cancer, and testicular cancer. It is also used to prevent and to treat fluid build-up around the lungs caused by some cancers. This medicine may be used for other purposes; ask your health care provider or pharmacist if you have questions. COMMON BRAND NAME(S): Blenoxane What should I tell my health care provider before I take this medicine? They need to know if you have any of these conditions:  cigarette smoker  kidney  disease  lung disease  recent or ongoing radiation therapy  an unusual or allergic reaction to bleomycin, other chemotherapy agents, other medicines, foods, dyes, or preservatives  pregnant or trying to get pregnant  breast-feeding How should I use this medicine? This drug is given as an infusion into a vein or a body cavity. It can also be given as an injection into a muscle or under the skin. It is administered in a hospital or clinic by a specially trained health care professional. Talk to your pediatrician regarding the use of this medicine in children. Special care may be needed. Overdosage: If you think you have taken too much of this medicine contact a poison control center or emergency room at once. NOTE: This medicine is only for you. Do not share this medicine with others. What if I miss a dose? It is important not to miss your dose. Call your doctor or health care professional if you are unable to keep an appointment. What may interact with this medicine?  certain antibiotics given by injection  cisplatin  cyclosporine  diuretics  foscarnet  medicines to increase blood counts like filgrastim, pegfilgrastim, sargramostim  vaccines This list may not describe  all possible interactions. Give your health care provider a list of all the medicines, herbs, non-prescription drugs, or dietary supplements you use. Also tell them if you smoke, drink alcohol, or use illegal drugs. Some items may interact with your medicine. What should I watch for while using this medicine? Visit your doctor for checks on your progress. This drug may make you feel generally unwell. This is not uncommon, as chemotherapy can affect healthy cells as well as cancer cells. Report any side effects. Continue your course of treatment even though you feel ill unless your doctor tells you to stop. Call your doctor or health care professional for advice if you get a fever, chills or sore throat, or other symptoms of a cold or flu. Do not treat yourself. This drug decreases your body's ability to fight infections. Try to avoid being around people who are sick. Avoid taking products that contain aspirin, acetaminophen, ibuprofen, naproxen, or ketoprofen unless instructed by your doctor. These medicines may hide a fever. Do not become pregnant while taking this medicine. Women should inform their doctor if they wish to become pregnant or think they might be pregnant. There is a potential for serious side effects to an unborn child. Talk to your health care professional or pharmacist for more information. Do not breast-feed an infant while taking this medicine. There is a maximum amount of this medicine you should receive throughout your life. The amount depends on the medical condition being treated and your overall health. Your doctor will watch how much of this medicine you receive in your lifetime. Tell your doctor if you have taken this medicine before. What side effects may I notice from receiving this medicine? Side effects that you should report to your doctor or health care professional as soon as possible:  allergic reactions like skin rash, itching or hives, swelling of the face, lips, or  tongue  breathing problems  chest pain  confusion  cough  fast, irregular heartbeat  feeling faint or lightheaded, falls  fever or chills  mouth sores  pain, tingling, numbness in the hands or feet  trouble passing urine or change in the amount of urine  yellowing of the eyes or skin Side effects that usually do not require medical attention (report to your doctor or health care  professional if they continue or are bothersome):  darker skin color  hair loss  irritation at site where injected  loss of appetite  nail changes  nausea and vomiting  weight loss This list may not describe all possible side effects. Call your doctor for medical advice about side effects. You may report side effects to FDA at 1-800-FDA-1088. Where should I keep my medicine? This drug is given in a hospital or clinic and will not be stored at home. NOTE: This sheet is a summary. It may not cover all possible information. If you have questions about this medicine, talk to your doctor, pharmacist, or health care provider.  2020 Elsevier/Gold Standard (2012-12-29 09:36:48)   Dacarbazine, DTIC injection What is this medicine? DACARBAZINE (da KAR ba zeen) is a chemotherapy drug. This medicine is used to treat skin cancer. It is also used with other medicines to treat Hodgkin's disease. This medicine may be used for other purposes; ask your health care provider or pharmacist if you have questions. COMMON BRAND NAME(S): DTIC-Dome What should I tell my health care provider before I take this medicine? They need to know if you have any of these conditions:  infection (especially virus infection such as chickenpox, cold sores, or herpes)  kidney disease  liver disease  low blood counts like low platelets, red blood cells, white blood cells  recent radiation therapy  an unusual or allergic reaction to dacarbazine, other chemotherapy agents, other medicines, foods, dyes, or  preservatives  pregnant or trying to get pregnant  breast-feeding How should I use this medicine? This drug is given as an injection or infusion into a vein. It is administered in a hospital or clinic by a specially trained health care professional. Talk to your pediatrician regarding the use of this medicine in children. While this drug may be prescribed for selected conditions, precautions do apply. Overdosage: If you think you have taken too much of this medicine contact a poison control center or emergency room at once. NOTE: This medicine is only for you. Do not share this medicine with others. What if I miss a dose? It is important not to miss your dose. Call your doctor or health care professional if you are unable to keep an appointment. What may interact with this medicine?  medicines to increase blood counts like filgrastim, pegfilgrastim, sargramostim  vaccines This list may not describe all possible interactions. Give your health care provider a list of all the medicines, herbs, non-prescription drugs, or dietary supplements you use. Also tell them if you smoke, drink alcohol, or use illegal drugs. Some items may interact with your medicine. What should I watch for while using this medicine? Your condition will be monitored carefully while you are receiving this medicine. You will need important blood work done while you are taking this medicine. This drug may make you feel generally unwell. This is not uncommon, as chemotherapy can affect healthy cells as well as cancer cells. Report any side effects. Continue your course of treatment even though you feel ill unless your doctor tells you to stop. Call your doctor or health care professional for advice if you get a fever, chills or sore throat, or other symptoms of a cold or flu. Do not treat yourself. This drug decreases your body's ability to fight infections. Try to avoid being around people who are sick. This medicine may  increase your risk to bruise or bleed. Call your doctor or health care professional if you notice any unusual bleeding.  Talk to your doctor about your risk of cancer. You may be more at risk for certain types of cancers if you take this medicine. Do not become pregnant while taking this medicine. Women should inform their doctor if they wish to become pregnant or think they might be pregnant. There is a potential for serious side effects to an unborn child. Talk to your health care professional or pharmacist for more information. Do not breast-feed an infant while taking this medicine. What side effects may I notice from receiving this medicine? Side effects that you should report to your doctor or health care professional as soon as possible:  allergic reactions like skin rash, itching or hives, swelling of the face, lips, or tongue  low blood counts - this medicine may decrease the number of white blood cells, red blood cells and platelets. You may be at increased risk for infections and bleeding.  signs of infection - fever or chills, cough, sore throat, pain or difficulty passing urine  signs of decreased platelets or bleeding - bruising, pinpoint red spots on the skin, black, tarry stools, blood in the urine  signs of decreased red blood cells - unusually weak or tired, fainting spells, lightheadedness  breathing problems  muscle pains  pain at site where injected  trouble passing urine or change in the amount of urine  vomiting  yellowing of the eyes or skin Side effects that usually do not require medical attention (report to your doctor or health care professional if they continue or are bothersome):  diarrhea  hair loss  loss of appetite  nausea  skin more sensitive to sun or ultraviolet light  stomach upset This list may not describe all possible side effects. Call your doctor for medical advice about side effects. You may report side effects to FDA at  1-800-FDA-1088. Where should I keep my medicine? This drug is given in a hospital or clinic and will not be stored at home. NOTE: This sheet is a summary. It may not cover all possible information. If you have questions about this medicine, talk to your doctor, pharmacist, or health care provider.  2020 Elsevier/Gold Standard (2015-11-03 15:17:39)

## 2019-06-28 ENCOUNTER — Telehealth: Payer: Self-pay | Admitting: Hematology

## 2019-06-28 ENCOUNTER — Telehealth: Payer: Self-pay | Admitting: *Deleted

## 2019-06-28 ENCOUNTER — Ambulatory Visit: Payer: No Typology Code available for payment source | Admitting: Obstetrics and Gynecology

## 2019-06-28 NOTE — Telephone Encounter (Signed)
Checked patient out, awaiting further instructions from provider to finish scheduling for this patient.

## 2019-06-28 NOTE — Telephone Encounter (Signed)
-----   Message from Shelda Altes, RN sent at 06/25/2019  2:15 PM EDT ----- Regarding: First Time Follow Up Call Please call patient to see how she is doing after receiving ABVD for the first time on Friday. She is a patient of Dr. Irene Limbo.  Thanks, Jarrett Soho

## 2019-06-28 NOTE — Telephone Encounter (Signed)
Called pt to see how she did after her chemo treatment on Friday.  She reports doing well & denies any problems other than slight constipation.  She ate 5 prunes & this helped.  She asked about taking a stool softener & informed that this is OK & that if she notices a trend of constipation with treatments to go ahead & start stool softener prior to treatment & stay on for a at least a few days or as needed.  She expresses understanding of side effects & knows how to reach [place].

## 2019-06-30 ENCOUNTER — Telehealth: Payer: Self-pay | Admitting: Hematology

## 2019-06-30 NOTE — Telephone Encounter (Signed)
Called patient to confirm upcoming appointments, patient will receive a copy of her calender tomorrow.

## 2019-07-01 ENCOUNTER — Other Ambulatory Visit: Payer: Self-pay | Admitting: *Deleted

## 2019-07-01 DIAGNOSIS — C8118 Nodular sclerosis classical Hodgkin lymphoma, lymph nodes of multiple sites: Secondary | ICD-10-CM

## 2019-07-01 NOTE — Progress Notes (Signed)
HEMATOLOGY/ONCOLOGY CONSULTATION NOTE  Date of Service: 07/02/2019  Patient Care Team: [name], MD as PCP - General (Family Medicine)  CHIEF COMPLAINTS/PURPOSE OF CONSULTATION:  Microcytic Anemia Newly diagnosed classical hodgkins lymphoma  HISTORY OF PRESENTING ILLNESS:   Elizabeth Beasley is a wonderful 38 y.o. female who has been referred to [place] by Dr Madilyn Fireman for evaluation and management of microcytic anemia/cervical lymphadenopathy.The pt reports that she has a history of iron deficiency but has never had a transfusion. Pt has had two pregnancies and has two children, ages 63 and 37. There was a concern for anemia with her second pregnancy so she was given an erythropoietin shot. She has had a copper IUD for about 2 years and her menstrual cycle has been slightly longer since getting the IUD. She has been taking 28 mg TID PO iron since June. Pt has no known medication allergies and has had a previous removal of Bartholin's gland cyst. Pt has had no known contact with chemicals either through work or hobbies. She has no history of asthma or heavy smoke/aerosol exposure.   Pt did not notice any cervical lymphadenopathy in June. In late July, early August she began to experience extreme fatigue and could begin to feel small lumps in her neck. Her fatigue is improved with taking her PO Iron TID. She has experienced a couple of episodes of night sweats, with the latest occurring in late August. Pt has also had <10 lbs loss in 2 months. She has not had any rashes but is experiencing itching in her palms, broader itching after hot showers.   Pt is currently working from home. She has had her seasonal flu shot. Pt is not planning on having more children and is not concerned about fertility. Pt's husband would like to take a drive to the [facility] next month. Pt would not want any blood product transfusions, as she is a Jehovah's Witness. She is comfortable with IV iron  infusions.   INTERVAL HISTORY:  Elizabeth Beasley is a 38 y.o. female here for evaluation and management of hodgkins lymphoma. The patient's last visit with [place] was on 06/25/2019. The pt reports that she is doing well overall.  The pt reports she was okay she had slight nausea on Tuesday. Her eating has been okay. She has a slight headache which she thinks is from hormones and because she is still spotting after having the IUD placed   Pt is walking a few times a day. Lab results today (10/16/20200) of CBC w/diff and CMP is as follows: all values are WNL except for Hemoglobin at 11.0, HCT at 35.6, MCH at 25.8, and RDW at 27.1 07/02/2019 CMP all values are WNL except for calcium at 8.7, Albumin at 3.0, and AST at 10.  On review of systems, pt reports some mouth sores and denies tingling and numbness in feet, legs swelling, rashes, problems with port, back pain, abdominal pain, leg swelling and any other symptoms.      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   Pt is scheduled for surgery       SURGICAL HISTORY: Past Surgical History:  Procedure Laterality Date   CESAREAN SECTION  2010   MASS  EXCISION Right 05/21/2019   Procedure: EXCISION OF RIGHT DEEP NECK LYMPH NODE;  Surgeon: Fanny Skates, MD;  Location: [facility];  Service: General;  Laterality: Right;   PORTACATH PLACEMENT Right 06/08/2019   Procedure: INSERTION PORT-A-CATH WITH ULTRASOUND GUIDANCE;  Surgeon: Fanny Skates, MD;  Location: [facility];  Service: General;  Laterality: Right;   URETHRAL CYST REMOVAL N/A 08/30/2016   Procedure: BARTHOLIN'S GLAND CYST EXCISION;  Surgeon: Ardis Hughs, MD;  Location: [facility];  Service:  Urology;  Laterality: N/A;   v back     2012    SOCIAL HISTORY: Social History   Socioeconomic History   Marital status: Married    Spouse name: Derrick   Number of children: 2   Years of education: Assoc   Highest education level: Not on file  Occupational History   Occupation: CMA    Employer: [employer] strain: Not on file   Food insecurity    Worry: Not on file    Inability: Not on file   Transportation needs    Medical: Not on file    Non-medical: Not on file  Tobacco Use   Smoking status: Never Smoker   Smokeless tobacco: Never Used  Substance and Sexual Activity   Alcohol use: Not Currently    Comment: ocassional   Drug use: No   Sexual activity: Yes    Partners: Male    Birth control/protection: I.U.D.  Lifestyle   Physical activity    Days per week: Not on file    Minutes per session: Not on file   Stress: Not on file  Relationships   Social connections    Talks on phone: Not on file    Gets together: Not on file    Attends religious service: Not on file    Active member of club or organization: Not on file    Attends meetings of clubs or organizations: Not on file    Relationship status: Not on file   Intimate partner violence    Fear of current or ex partner: Not on file    Emotionally abused: Not on file    Physically abused: Not on file    Forced sexual activity: Not on file  Other Topics Concern   Not on file  Social History Narrative   Regular exercise. No regular caffeine.    FAMILY HISTORY: Family History  Problem Relation Age of Onset   Colon cancer Father 45   Colon polyps Mother    Hypertension Mother    Diabetes Paternal Grandmother    Stroke Paternal Grandmother    Alcohol abuse Paternal Uncle    Colon cancer Paternal Aunt 52   Esophageal cancer Neg Hx    Rectal cancer Neg Hx    Stomach cancer Neg Hx    On PMHx the pt reports C-section, Bartholin's Gland Cyst  removal. On Social Hx the pt reports non smoker, no alcohol use. On Family Hx the pt reports father passed from colon cancer, mother has colon polyps.  ALLERGIES:  has No Known Allergies.  MEDICATIONS:  Current Outpatient Medications  Medication Sig Dispense Refill   cetirizine (ZYRTEC) 5 MG tablet Take 5 mg by mouth daily as needed for allergies.     dexamethasone (DECADRON) 4 MG tablet Take 2 tablets by mouth once a day on the day after chemotherapy and then take 2 tablets two times a day for 2 days. Take with food. (Patient not  taking: Reported on 06/15/2019) 30 tablet 1   LORazepam (ATIVAN) 0.5 MG tablet Take 1 tablet (0.5 mg total) by mouth every 6 (six) hours as needed (Nausea or vomiting). (Patient not taking: Reported on 06/15/2019) 30 tablet 0   ondansetron (ZOFRAN) 8 MG tablet Take 1 tablet (8 mg total) by mouth 2 (two) times daily as needed. Start on the third day after chemotherapy. (Patient not taking: Reported on 06/15/2019) 30 tablet 1   potassium chloride SA (KLOR-CON) 20 MEQ tablet Take 1 tablet (20 mEq total) by mouth 2 (two) times daily. 60 tablet 1   prochlorperazine (COMPAZINE) 10 MG tablet Take 1 tablet (10 mg total) by mouth every 6 (six) hours as needed (Nausea or vomiting). (Patient not taking: Reported on 06/15/2019) 30 tablet 1   No current facility-administered medications for this visit.    Facility-Administered Medications Ordered in Other Visits  Medication Dose Route Frequency Provider Last Rate Last Dose   potassium chloride SA (KLOR-CON) CR tablet 40 mEq  40 mEq Oral Once Brunetta Genera, MD        REVIEW OF SYSTEMS:    A 10+ POINT REVIEW OF SYSTEMS WAS OBTAINED including neurology, dermatology, psychiatry, cardiac, respiratory, lymph, extremities, GI, GU, Musculoskeletal, constitutional, breasts, reproductive, HEENT.  All pertinent positives are noted in the HPI.  All others are negative.   PHYSICAL EXAMINATION: ECOG FS:1 - Symptomatic but  completely ambulatory  There were no vitals filed for this visit. Wt Readings from Last 3 Encounters:  06/25/19 194 lb 6.4 oz (88.2 kg)  06/15/19 194 lb (88 kg)  06/08/19 192 lb 14.4 oz (87.5 kg)   There is no height or weight on file to calculate BMI.    GENERAL:alert, in no acute distress and comfortable SKIN: no acute rashes, no significant lesions EYES: conjunctiva are pink and non-injected, sclera anicteric OROPHARYNX: MMM, no exudates, no oropharyngeal erythema or ulceration NECK: supple, no JVD LYMPH:  no palpable lymphadenopathy in the cervical, axillary or inguinal regions LUNGS: clear to auscultation b/l with normal respiratory effort HEART: regular rate & rhythm ABDOMEN:  normoactive bowel sounds , non tender, not distended. Extremity: no pedal edema PSYCH: alert & oriented x 3 with fluent speech NEURO: no focal motor/sensory deficits   LABORATORY DATA:  I have reviewed the data as listed  - 05/14/2019 Fe+TIBC+Fer is as follows: Iron at 11, TIBC at 258, % Sat at 4, Ferritin at 50. -Discussed 05/21/2019 Lymph Node Bx [id number]) which revealed " CLASSIC HODGKIN LYMPHOMA."  - 05/19/2019 CT C/A/P ([id number]) which revealed  "1. Enlarged mediastinal, bilateral hilar and lower cervical lymph nodes. Primary considerations include lymphoma, sarcoid and less likely metastatic disease. Normal spleen. No evidence of enlarged lymph nodes within the abdomen or pelvis. 2. Low lying IUD within the LOWER uterine segment and may extend to the cervix."  - 05/17/2019 CT Soft Tissue Neck w/contrast ([id number]) which revealed "Bulky right greater than left cervical and mediastinal lymphadenopathy concerning for lymphoma."  . CBC Latest Ref Rng & Units 06/25/2019 06/01/2019 05/20/2019  WBC 4.0 - 10.5 K/uL 11.3(H) 12.5(H) 9.0  Hemoglobin 12.0 - 15.0 g/dL 10.2(L) 8.6(L) 9.2(L)  Hematocrit 36.0 - 46.0 % 33.1(L) 29.4(L) 31.8(L)  Platelets 150 - 400 K/uL 391 480(H) 547(H)    . CMP Latest  Ref Rng & Units 06/25/2019 06/01/2019 05/20/2019  Glucose 70 - 99 mg/dL 108(H) 96 88  BUN 6 - 20 mg/dL 8 8 6   Creatinine 0.44 - 1.00 mg/dL 0.67 0.70 0.67  Sodium 135 -  145 mmol/L 140 140 137  Potassium 3.5 - 5.1 mmol/L 3.3(L) 3.6 3.4(L)  Chloride 98 - 111 mmol/L 105 104 101  CO2 22 - 32 mmol/L 24 27 24   Calcium 8.9 - 10.3 mg/dL 9.4 9.4 9.2  Total Protein 6.5 - 8.1 g/dL 8.4(H) 8.6(H) -  Total Bilirubin 0.3 - 1.2 mg/dL 0.5 0.7 -  Alkaline Phos 38 - 126 U/L 120 110 -  AST 15 - 41 U/L 14(L) 15 -  ALT 0 - 44 U/L 26 31 -   . Lab Results  Component Value Date   IRON 36 (L) 06/25/2019   TIBC 158 (L) 06/25/2019   IRONPCTSAT 23 06/25/2019   (Iron and TIBC)  Lab Results  Component Value Date   FERRITIN 3,023 (H) 06/25/2019    05/21/2019 [id number]) Lymph Node Biopsy    RADIOGRAPHIC STUDIES: I have personally reviewed the radiological images as listed and agreed with the findings in the report. Nm Pet Image Initial (pi) Skull Base To Thigh  Result Date: 06/14/2019 CLINICAL DATA:  Initial treatment strategy for Hodgkin lymphoma. EXAM: NUCLEAR MEDICINE PET SKULL BASE TO THIGH TECHNIQUE: 9.6 mCi F-18 FDG was injected intravenously. Full-ring PET imaging was performed from the skull base to thigh after the radiotracer. CT data was obtained and used for attenuation correction and anatomic localization. Fasting blood glucose: 96 mg/dl COMPARISON:  CT chest abdomen pelvis 05/19/2019. FINDINGS: Mediastinal blood pool activity: SUV max 2.9 Liver activity: SUV max 3.6 NECK: There are hypermetabolic lymph nodes in the neck bilaterally. Index right level-II lymph node measures 2.0 cm in short axis (4/32) with an SUV max of 13.8. Incidental CT findings: None. CHEST: Hypermetabolic supraclavicular node are seen bilaterally. Index lymph node on the right measures 1.6 cm in short axis (4/41) with an SUV max 12.5. Bulky hypermetabolic mediastinal adenopathy is seen with an index high right paratracheal lymph  node measuring 2.3 cm (4/53) with an SUV max 10.7. Bihilar hypermetabolic lymph nodes are seen with an index SUV max on the left of 11.5. Hypermetabolic subpectoral lymph nodes on the left have an SUV max of 10.6 and measure up to 11 mm in short axis (4/52). Hypermetabolic prepericardiac lymph node measures 7 mm (4/93) with an SUV max of 4.9. No hypermetabolic pulmonary nodules. Incidental CT findings: Right IJ Port-A-Cath terminates at the SVC RA junction. No pericardial or pleural effusion. ABDOMEN/PELVIS: No abnormal hypermetabolism in the liver, adrenal glands, spleen or pancreas. Mild hypermetabolism is seen in the periportal region with an index 7 mm lymph node (4/100) and SUV max of 5.3. No additional hypermetabolic lymph nodes in the abdomen or pelvis. Incidental CT findings: Low-attenuation lesions in the liver measure up to 13 mm and are too small to characterize but cysts are likely. Liver, gallbladder, adrenal glands, kidneys, spleen, pancreas, stomach and bowel are otherwise unremarkable. Intrauterine contraceptive device is again seen in the lower uterine segment. Bladder wall thickening may be due to underdistention. SKELETON: No abnormal osseous hypermetabolism. Incidental CT findings: None. IMPRESSION: Hypermetabolic lymph nodes in the neck, chest and abdomen, consistent with the given history of Hodgkin lymphoma ([place] 5). Electronically Signed   By: Lorin Picket M.D.   On: 06/14/2019 08:57   Dg Chest Port 1 View  Result Date: 06/08/2019 CLINICAL DATA:  Right-sided Port-A-Cath placement. Hodgkin's lymphoma EXAM: PORTABLE CHEST 1 VIEW COMPARISON:  CT 05/19/2019 FINDINGS: Right-sided power port tip at the SVC RA junction. No pneumothorax. Massive mediastinal adenopathy as seen previously. Lung parenchyma is clear. IMPRESSION: Power  port well positioned with tip at the SVC RA junction. No complicating feature. Massive mediastinal adenopathy again demonstrated. Electronically Signed   By:  Nelson Chimes M.D.   On: 06/08/2019 09:29   Dg Fluoro Guide Cv Line-no Report  Result Date: 06/08/2019 Fluoroscopy was utilized by the requesting physician.  No radiographic interpretation.    ASSESSMENT & PLAN:   1) Newly diagnosed Classical Hodgkins lymphoma Likely stage IIA based on imaging thus far. 2) Microcytic Anemia due to anemia of chronic disease and Iron deficiency 3) Jehovas Witness   PLAN: -Discussed pt labwork today, 07/02/2019; CBC w/diff and CMP is as follows: all values are WNL except for Hemoglobin at 11.0, HCT at 35.6, MCH at 25.8, and RDW at 27.1 07/02/2019 CMP all values are WNL except for calcium at 8.7, Albumin at 3.0, and AST at 10. -chemotherapy counseling completed. -port a cath placed. -Recommened water salt and baking soda rinse for mouth sores -Advised pt that she seems to tolerating first round of treatment. -encouraged patient to stay physically active. -Advised that labs from 07/02/19 show improvement and are not concerning.    FOLLOW UP: F/u as per scheduled C1D15 appointment with labs and MD visit    The total time spent in the appt was 25 minutes and more than 50% was on counseling and direct patient cares.  All of the patient's questions were answered with apparent satisfaction. The patient knows to call the clinic with any problems, questions or concerns.     Sullivan Lone MD MS AAHIVMS [facility] Hematology/Oncology Physician [facility]  (Office):       [phone] (Work cell):  [phone] (Fax):           [phone]  07/01/2019 9:18 PM  I, Scot Dock, am acting as a scribe for Dr. Sullivan Lone.   .I have reviewed the above documentation for accuracy and completeness, and I agree with the above. Brunetta Genera MD

## 2019-07-02 ENCOUNTER — Telehealth: Payer: Self-pay | Admitting: Hematology

## 2019-07-02 ENCOUNTER — Inpatient Hospital Stay: Payer: No Typology Code available for payment source

## 2019-07-02 ENCOUNTER — Inpatient Hospital Stay (HOSPITAL_BASED_OUTPATIENT_CLINIC_OR_DEPARTMENT_OTHER): Payer: No Typology Code available for payment source | Admitting: Hematology

## 2019-07-02 ENCOUNTER — Other Ambulatory Visit: Payer: Self-pay

## 2019-07-02 VITALS — BP 115/69 | HR 96 | Temp 98.2°F | Resp 18 | Ht 64.0 in | Wt 193.2 lb

## 2019-07-02 DIAGNOSIS — D500 Iron deficiency anemia secondary to blood loss (chronic): Secondary | ICD-10-CM

## 2019-07-02 DIAGNOSIS — C8111 Nodular sclerosis classical Hodgkin lymphoma, lymph nodes of head, face, and neck: Secondary | ICD-10-CM

## 2019-07-02 DIAGNOSIS — C8118 Nodular sclerosis classical Hodgkin lymphoma, lymph nodes of multiple sites: Secondary | ICD-10-CM

## 2019-07-02 DIAGNOSIS — Z5111 Encounter for antineoplastic chemotherapy: Secondary | ICD-10-CM | POA: Diagnosis not present

## 2019-07-02 DIAGNOSIS — Z7189 Other specified counseling: Secondary | ICD-10-CM

## 2019-07-02 DIAGNOSIS — Z95828 Presence of other vascular implants and grafts: Secondary | ICD-10-CM

## 2019-07-02 LAB — CBC WITH DIFFERENTIAL (CANCER CENTER ONLY)
Abs Immature Granulocytes: 0.07 10*3/uL (ref 0.00–0.07)
Basophils Absolute: 0 10*3/uL (ref 0.0–0.1)
Basophils Relative: 0 %
Eosinophils Absolute: 0.2 10*3/uL (ref 0.0–0.5)
Eosinophils Relative: 3 %
HCT: 35.6 % — ABNORMAL LOW (ref 36.0–46.0)
Hemoglobin: 11 g/dL — ABNORMAL LOW (ref 12.0–15.0)
Immature Granulocytes: 1 %
Lymphocytes Relative: 31 %
Lymphs Abs: 1.7 10*3/uL (ref 0.7–4.0)
MCH: 25.8 pg — ABNORMAL LOW (ref 26.0–34.0)
MCHC: 30.9 g/dL (ref 30.0–36.0)
MCV: 83.4 fL (ref 80.0–100.0)
Monocytes Absolute: 0.1 10*3/uL (ref 0.1–1.0)
Monocytes Relative: 1 %
Neutro Abs: 3.5 10*3/uL (ref 1.7–7.7)
Neutrophils Relative %: 64 %
Platelet Count: 357 10*3/uL (ref 150–400)
RBC: 4.27 MIL/uL (ref 3.87–5.11)
RDW: 27.1 % — ABNORMAL HIGH (ref 11.5–15.5)
WBC Count: 5.6 10*3/uL (ref 4.0–10.5)
nRBC: 0 % (ref 0.0–0.2)

## 2019-07-02 LAB — CMP (CANCER CENTER ONLY)
ALT: 18 U/L (ref 0–44)
AST: 10 U/L — ABNORMAL LOW (ref 15–41)
Albumin: 3 g/dL — ABNORMAL LOW (ref 3.5–5.0)
Alkaline Phosphatase: 76 U/L (ref 38–126)
Anion gap: 8 (ref 5–15)
BUN: 10 mg/dL (ref 6–20)
CO2: 26 mmol/L (ref 22–32)
Calcium: 8.7 mg/dL — ABNORMAL LOW (ref 8.9–10.3)
Chloride: 104 mmol/L (ref 98–111)
Creatinine: 0.69 mg/dL (ref 0.44–1.00)
GFR, Est AFR Am: >60 mL/min (ref >60)
GFR, Estimated: >60 mL/min (ref >60)
Glucose, Bld: 95 mg/dL (ref 70–99)
Potassium: 3.8 mmol/L (ref 3.5–5.1)
Sodium: 138 mmol/L (ref 135–145)
Total Bilirubin: 0.4 mg/dL (ref 0.3–1.2)
Total Protein: 7.2 g/dL (ref 6.5–8.1)

## 2019-07-02 MED ORDER — SODIUM CHLORIDE 0.9% FLUSH
10.0000 mL | Freq: Once | INTRAVENOUS | Status: AC
  Administered 2019-07-02: 10 mL
  Filled 2019-07-02: qty 10

## 2019-07-02 MED ORDER — HEPARIN SOD (PORK) LOCK FLUSH 100 UNIT/ML IV SOLN
500.0000 [IU] | Freq: Once | INTRAVENOUS | Status: AC
  Administered 2019-07-02: 500 [IU]
  Filled 2019-07-02: qty 5

## 2019-07-02 NOTE — Telephone Encounter (Signed)
Per

## 2019-07-02 NOTE — Telephone Encounter (Signed)
Per 10/15 los F/u as per scheduled C1D15 appointment with labs and MD visit

## 2019-07-08 ENCOUNTER — Other Ambulatory Visit: Payer: Self-pay | Admitting: *Deleted

## 2019-07-08 DIAGNOSIS — D500 Iron deficiency anemia secondary to blood loss (chronic): Secondary | ICD-10-CM

## 2019-07-08 DIAGNOSIS — C8111 Nodular sclerosis classical Hodgkin lymphoma, lymph nodes of head, face, and neck: Secondary | ICD-10-CM

## 2019-07-08 NOTE — Progress Notes (Signed)
HEMATOLOGY/ONCOLOGY CLINIC NOTE  Date of Service: 07/09/2019  Patient Care Team: [name], MD as PCP - General (Family Medicine)  CHIEF COMPLAINTS/PURPOSE OF CONSULTATION:  Microcytic Anemia- due to Iron deficiency  Recently diagnosed classical hodgkins lymphoma  HISTORY OF PRESENTING ILLNESS:   Elizabeth Beasley is a wonderful 38 y.o. female who has been referred to [place] by Dr Madilyn Fireman for evaluation and management of microcytic anemia/cervical lymphadenopathy.The pt reports that she has a history of iron deficiency but has never had a transfusion. Pt has had two pregnancies and has two children, ages 82 and 3. There was a concern for anemia with her second pregnancy so she was given an erythropoietin shot. She has had a copper IUD for about 2 years and her menstrual cycle has been slightly longer since getting the IUD. She has been taking 28 mg TID PO iron since June. Pt has no known medication allergies and has had a previous removal of Bartholin's gland cyst. Pt has had no known contact with chemicals either through work or hobbies. She has no history of asthma or heavy smoke/aerosol exposure.   Pt did not notice any cervical lymphadenopathy in June. In late July, early August she began to experience extreme fatigue and could begin to feel small lumps in her neck. Her fatigue is improved with taking her PO Iron TID. She has experienced a couple of episodes of night sweats, with the latest occurring in late August. Pt has also had <10 lbs loss in 2 months. She has not had any rashes but is experiencing itching in her palms, broader itching after hot showers.   Pt is currently working from home. She has had her seasonal flu shot. Pt is not planning on having more children and is not concerned about fertility. Pt's husband would like to take a drive to the [facility] next month. Pt would not want any blood product transfusions, as she is a Jehovah's Witness. She is  comfortable with IV iron infusions.   INTERVAL HISTORY:  Elizabeth Beasley is a 38 y.o. female here for evaluation and management of hodgkins lymphoma. The patient's last visit with [place] was on 07/02/2019. The pt reports that she is doing well overall.  The pt reports she has noticed some pigment changes on her fingers.  She is walking more and the shortness of breath has improved.  She still feels slightly anxious but not as much as before.  She is juicing now and is enjoying it.   The pt is still experiencing some tingling in her fingers.   Lab results today (07/09/19) of CBC w/diff and CMP is as follows: all values are WNL except for WBC Count at 3.7, Hemoglobin at 11.3, Neutro at 1.2, Glucose Bld at 100, Calcium at 8.8, Albumin at 3.1, AST at 10 Ferritin .590 - adequate.  On review of systems, pt reports and denies fevers, chills, problems with ports, nausea, leg swelling, skin rashes, changes in bowel habits, cough, abdominal pain and any other symptoms.     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   Pt is scheduled for surgery       SURGICAL HISTORY: Past Surgical History:  Procedure Laterality Date  CESAREAN SECTION  2010   MASS EXCISION Right 05/21/2019   Procedure: EXCISION OF RIGHT DEEP NECK LYMPH NODE;  Surgeon: Fanny Skates, MD;  Location: [place];  Service: General;  Laterality: Right;   PORTACATH PLACEMENT Right 06/08/2019   Procedure: INSERTION PORT-A-CATH WITH ULTRASOUND GUIDANCE;  Surgeon: Fanny Skates, MD;  Location: [place];  Service: General;  Laterality: Right;   URETHRAL CYST REMOVAL N/A 08/30/2016   Procedure: BARTHOLIN'S GLAND CYST EXCISION;  Surgeon: Ardis Hughs, MD;   Location: [facility];  Service: Urology;  Laterality: N/A;   v back     2012    SOCIAL HISTORY: Social History   Socioeconomic History   Marital status: Married    Spouse name: Derrick   Number of children: 2   Years of education: Assoc   Highest education level: Not on file  Occupational History   Occupation: CMA    Employer: [employer] strain: Not on file   Food insecurity    Worry: Not on file    Inability: Not on file   Transportation needs    Medical: Not on file    Non-medical: Not on file  Tobacco Use   Smoking status: Never Smoker   Smokeless tobacco: Never Used  Substance and Sexual Activity   Alcohol use: Not Currently    Comment: ocassional   Drug use: No   Sexual activity: Yes    Partners: Male    Birth control/protection: I.U.D.  Lifestyle   Physical activity    Days per week: Not on file    Minutes per session: Not on file   Stress: Not on file  Relationships   Social connections    Talks on phone: Not on file    Gets together: Not on file    Attends religious service: Not on file    Active member of club or organization: Not on file    Attends meetings of clubs or organizations: Not on file    Relationship status: Not on file   Intimate partner violence    Fear of current or ex partner: Not on file    Emotionally abused: Not on file    Physically abused: Not on file    Forced sexual activity: Not on file  Other Topics Concern   Not on file  Social History Narrative   Regular exercise. No regular caffeine.    FAMILY HISTORY: Family History  Problem Relation Age of Onset   Colon cancer Father 19   Colon polyps Mother    Hypertension Mother    Diabetes Paternal Grandmother    Stroke Paternal Grandmother    Alcohol abuse Paternal Uncle    Colon cancer Paternal Aunt 44   Esophageal cancer Neg Hx    Rectal cancer Neg Hx    Stomach cancer Neg Hx    On PMHx the  pt reports C-section, Bartholin's Gland Cyst removal. On Social Hx the pt reports non smoker, no alcohol use. On Family Hx the pt reports father passed from colon cancer, mother has colon polyps.  ALLERGIES:  has No Known Allergies.  MEDICATIONS:  Current Outpatient Medications  Medication Sig Dispense Refill   cetirizine (ZYRTEC) 5 MG tablet Take 5 mg by mouth daily as needed for allergies.     dexamethasone (DECADRON) 4 MG tablet Take 2 tablets by mouth once a day on the day after chemotherapy and then take 2 tablets two times a day for  2 days. Take with food. (Patient not taking: Reported on 06/15/2019) 30 tablet 1   LORazepam (ATIVAN) 0.5 MG tablet Take 1 tablet (0.5 mg total) by mouth every 6 (six) hours as needed (Nausea or vomiting). (Patient not taking: Reported on 06/15/2019) 30 tablet 0   ondansetron (ZOFRAN) 8 MG tablet Take 1 tablet (8 mg total) by mouth 2 (two) times daily as needed. Start on the third day after chemotherapy. (Patient not taking: Reported on 06/15/2019) 30 tablet 1   potassium chloride SA (KLOR-CON) 20 MEQ tablet Take 1 tablet (20 mEq total) by mouth 2 (two) times daily. 60 tablet 1   prochlorperazine (COMPAZINE) 10 MG tablet Take 1 tablet (10 mg total) by mouth every 6 (six) hours as needed (Nausea or vomiting). (Patient not taking: Reported on 06/15/2019) 30 tablet 1   No current facility-administered medications for this visit.     REVIEW OF SYSTEMS:    A 10+ POINT REVIEW OF SYSTEMS WAS OBTAINED including neurology, dermatology, psychiatry, cardiac, respiratory, lymph, extremities, GI, GU, Musculoskeletal, constitutional, breasts, reproductive, HEENT.  All pertinent positives are noted in the HPI.  All others are negative.     PHYSICAL EXAMINATION: ECOG FS:1 - Symptomatic but completely ambulatory  There were no vitals filed for this visit. Wt Readings from Last 3 Encounters:  07/02/19 193 lb 3.2 oz (87.6 kg)  06/25/19 194 lb 6.4 oz (88.2 kg)    06/15/19 194 lb (88 kg)   There is no height or weight on file to calculate BMI.    GENERAL:alert, in no acute distress and comfortable SKIN: no acute rashes, no significant lesions EYES: conjunctiva are pink and non-injected, sclera anicteric OROPHARYNX: MMM, no exudates, no oropharyngeal erythema or ulceration NECK: supple, no JVD LYMPH:  no palpable lymphadenopathy in the cervical, axillary or inguinal regions LUNGS: clear to auscultation b/l with normal respiratory effort HEART: regular rate & rhythm ABDOMEN:  normoactive bowel sounds , non tender, not distended. Extremity: no pedal edema PSYCH: alert & oriented x 3 with fluent speech NEURO: no focal motor/sensory deficits   LABORATORY DATA:  I have reviewed the data as listed  - 05/14/2019 Fe+TIBC+Fer is as follows: Iron at 11, TIBC at 258, % Sat at 4, Ferritin at 50. -Discussed 05/21/2019 Lymph Node Bx [id number]) which revealed " CLASSIC HODGKIN LYMPHOMA."  - 05/19/2019 CT C/A/P ([id number]) which revealed  "1. Enlarged mediastinal, bilateral hilar and lower cervical lymph nodes. Primary considerations include lymphoma, sarcoid and less likely metastatic disease. Normal spleen. No evidence of enlarged lymph nodes within the abdomen or pelvis. 2. Low lying IUD within the LOWER uterine segment and may extend to the cervix."  - 05/17/2019 CT Soft Tissue Neck w/contrast ([id number]) which revealed "Bulky right greater than left cervical and mediastinal lymphadenopathy concerning for lymphoma."  . CBC Latest Ref Rng & Units 07/02/2019 06/25/2019 06/01/2019  WBC 4.0 - 10.5 K/uL 5.6 11.3(H) 12.5(H)  Hemoglobin 12.0 - 15.0 g/dL 11.0(L) 10.2(L) 8.6(L)  Hematocrit 36.0 - 46.0 % 35.6(L) 33.1(L) 29.4(L)  Platelets 150 - 400 K/uL 357 391 480(H)    . CMP Latest Ref Rng & Units 07/02/2019 06/25/2019 06/01/2019  Glucose 70 - 99 mg/dL 95 108(H) 96  BUN 6 - 20 mg/dL 10 8 8   Creatinine 0.44 - 1.00 mg/dL 0.69 0.67 0.70  Sodium 135 - 145  mmol/L 138 140 140  Potassium 3.5 - 5.1 mmol/L 3.8 3.3(L) 3.6  Chloride 98 - 111 mmol/L 104 105 104  CO2 22 - 32 mmol/L 26  24 27  Calcium 8.9 - 10.3 mg/dL 8.7(L) 9.4 9.4  Total Protein 6.5 - 8.1 g/dL 7.2 8.4(H) 8.6(H)  Total Bilirubin 0.3 - 1.2 mg/dL 0.4 0.5 0.7  Alkaline Phos 38 - 126 U/L 76 120 110  AST 15 - 41 U/L 10(L) 14(L) 15  ALT 0 - 44 U/L 18 26 31    . Lab Results  Component Value Date   IRON 36 (L) 06/25/2019   TIBC 158 (L) 06/25/2019   IRONPCTSAT 23 06/25/2019   (Iron and TIBC)  Lab Results  Component Value Date   FERRITIN 3,023 (H) 06/25/2019    05/21/2019 [id number]) Lymph Node Biopsy    RADIOGRAPHIC STUDIES: I have personally reviewed the radiological images as listed and agreed with the findings in the report. Nm Pet Image Initial (pi) Skull Base To Thigh  Result Date: 06/14/2019 CLINICAL DATA:  Initial treatment strategy for Hodgkin lymphoma. EXAM: NUCLEAR MEDICINE PET SKULL BASE TO THIGH TECHNIQUE: 9.6 mCi F-18 FDG was injected intravenously. Full-ring PET imaging was performed from the skull base to thigh after the radiotracer. CT data was obtained and used for attenuation correction and anatomic localization. Fasting blood glucose: 96 mg/dl COMPARISON:  CT chest abdomen pelvis 05/19/2019. FINDINGS: Mediastinal blood pool activity: SUV max 2.9 Liver activity: SUV max 3.6 NECK: There are hypermetabolic lymph nodes in the neck bilaterally. Index right level-II lymph node measures 2.0 cm in short axis (4/32) with an SUV max of 13.8. Incidental CT findings: None. CHEST: Hypermetabolic supraclavicular node are seen bilaterally. Index lymph node on the right measures 1.6 cm in short axis (4/41) with an SUV max 12.5. Bulky hypermetabolic mediastinal adenopathy is seen with an index high right paratracheal lymph node measuring 2.3 cm (4/53) with an SUV max 10.7. Bihilar hypermetabolic lymph nodes are seen with an index SUV max on the left of 11.5. Hypermetabolic subpectoral  lymph nodes on the left have an SUV max of 10.6 and measure up to 11 mm in short axis (4/52). Hypermetabolic prepericardiac lymph node measures 7 mm (4/93) with an SUV max of 4.9. No hypermetabolic pulmonary nodules. Incidental CT findings: Right IJ Port-A-Cath terminates at the SVC RA junction. No pericardial or pleural effusion. ABDOMEN/PELVIS: No abnormal hypermetabolism in the liver, adrenal glands, spleen or pancreas. Mild hypermetabolism is seen in the periportal region with an index 7 mm lymph node (4/100) and SUV max of 5.3. No additional hypermetabolic lymph nodes in the abdomen or pelvis. Incidental CT findings: Low-attenuation lesions in the liver measure up to 13 mm and are too small to characterize but cysts are likely. Liver, gallbladder, adrenal glands, kidneys, spleen, pancreas, stomach and bowel are otherwise unremarkable. Intrauterine contraceptive device is again seen in the lower uterine segment. Bladder wall thickening may be due to underdistention. SKELETON: No abnormal osseous hypermetabolism. Incidental CT findings: None. IMPRESSION: Hypermetabolic lymph nodes in the neck, chest and abdomen, consistent with the given history of Hodgkin lymphoma ([place] 5). Electronically Signed   By: Lorin Picket M.D.   On: 06/14/2019 08:57    ASSESSMENT & PLAN:   1) Newly diagnosed Classical Hodgkins lymphoma Likely stage IIIB based on imaging thus far. 2) Microcytic Anemia due to anemia of chronic disease and Iron deficiency- hg improved to 11.3 3) Jehovas Witness   PLAN:  -Discussed pt labwork today, 07/09/19; CBC w/diff and CMP is as follows: all values are WNL except for WBC Count at 3.7, Hemoglobin at 11.3, Neutro at 1.2k, Glucose Bld at 100, Calcium at 8.8, Albumin at  3.1, AST at 10 -will monitor her neutropenia. - no prohibitive toxicities from continuing her ABVD at this time. -appropriate to proceed with Cycle 1 D15 of ABVD at this time. -Discussed her anemia is resolving s/p IV  Iron -ferritin > 100 and at goal now -Discussed that the treatment can cause sensitive to sun light, changes in skin pigmentation -Discussed that hemoglobin is at 11.3 (07/09/2019) and shows signs of improving -WBC borderline low but treatment is still to be continued. We discussed that we would try to avoid neulasta as much as possible while on bleomycin to reduce potential lung toxicities. -Discussed that Calcium levels are slightly low -Recommended adding Vitamin D 2000 units a day  -chemotherapy orders reviewed and signed. -RTC in 2 weeks   FOLLOW UP: Please schedule C2 and C3 of ABVD with portflush, labs and MD visit  The total time spent in the appt was 25 minutes and more than 50% was on counseling and direct patient cares.  All of the patient's questions were answered with apparent satisfaction. The patient knows to call the clinic with any problems, questions or concerns.    Sullivan Lone MD MS AAHIVMS [facility] Hematology/Oncology Physician [facility]  (Office):       [phone] (Work cell):  [phone] (Fax):           [phone]  07/08/2019 11:39 PM  I, Scot Dock, am acting as a scribe for Dr. Sullivan Lone.   .I have reviewed the above documentation for accuracy and completeness, and I agree with the above. Brunetta Genera MD

## 2019-07-09 ENCOUNTER — Inpatient Hospital Stay: Payer: No Typology Code available for payment source

## 2019-07-09 ENCOUNTER — Other Ambulatory Visit: Payer: Self-pay

## 2019-07-09 ENCOUNTER — Inpatient Hospital Stay (HOSPITAL_BASED_OUTPATIENT_CLINIC_OR_DEPARTMENT_OTHER): Payer: No Typology Code available for payment source | Admitting: Hematology

## 2019-07-09 VITALS — BP 121/74 | HR 77 | Temp 98.5°F | Resp 18 | Ht 64.0 in | Wt 196.7 lb

## 2019-07-09 DIAGNOSIS — C8111 Nodular sclerosis classical Hodgkin lymphoma, lymph nodes of head, face, and neck: Secondary | ICD-10-CM

## 2019-07-09 DIAGNOSIS — Z5111 Encounter for antineoplastic chemotherapy: Secondary | ICD-10-CM

## 2019-07-09 DIAGNOSIS — C8118 Nodular sclerosis classical Hodgkin lymphoma, lymph nodes of multiple sites: Secondary | ICD-10-CM | POA: Diagnosis not present

## 2019-07-09 DIAGNOSIS — Z7189 Other specified counseling: Secondary | ICD-10-CM

## 2019-07-09 DIAGNOSIS — D500 Iron deficiency anemia secondary to blood loss (chronic): Secondary | ICD-10-CM

## 2019-07-09 DIAGNOSIS — T451X5A Adverse effect of antineoplastic and immunosuppressive drugs, initial encounter: Secondary | ICD-10-CM

## 2019-07-09 DIAGNOSIS — Z95828 Presence of other vascular implants and grafts: Secondary | ICD-10-CM

## 2019-07-09 DIAGNOSIS — D701 Agranulocytosis secondary to cancer chemotherapy: Secondary | ICD-10-CM | POA: Diagnosis not present

## 2019-07-09 LAB — CMP (CANCER CENTER ONLY)
ALT: 14 U/L (ref 0–44)
AST: 10 U/L — ABNORMAL LOW (ref 15–41)
Albumin: 3.1 g/dL — ABNORMAL LOW (ref 3.5–5.0)
Alkaline Phosphatase: 64 U/L (ref 38–126)
Anion gap: 9 (ref 5–15)
BUN: 10 mg/dL (ref 6–20)
CO2: 25 mmol/L (ref 22–32)
Calcium: 8.8 mg/dL — ABNORMAL LOW (ref 8.9–10.3)
Chloride: 106 mmol/L (ref 98–111)
Creatinine: 0.69 mg/dL (ref 0.44–1.00)
GFR, Est AFR Am: >60 mL/min (ref >60)
GFR, Estimated: >60 mL/min (ref >60)
Glucose, Bld: 100 mg/dL — ABNORMAL HIGH (ref 70–99)
Potassium: 3.5 mmol/L (ref 3.5–5.1)
Sodium: 140 mmol/L (ref 135–145)
Total Bilirubin: 0.4 mg/dL (ref 0.3–1.2)
Total Protein: 6.7 g/dL (ref 6.5–8.1)

## 2019-07-09 LAB — CBC WITH DIFFERENTIAL (CANCER CENTER ONLY)
Abs Immature Granulocytes: 0.01 10*3/uL (ref 0.00–0.07)
Basophils Absolute: 0 10*3/uL (ref 0.0–0.1)
Basophils Relative: 1 %
Eosinophils Absolute: 0.2 10*3/uL (ref 0.0–0.5)
Eosinophils Relative: 4 %
HCT: 36.3 % (ref 36.0–46.0)
Hemoglobin: 11.3 g/dL — ABNORMAL LOW (ref 12.0–15.0)
Immature Granulocytes: 0 %
Lymphocytes Relative: 50 %
Lymphs Abs: 1.8 10*3/uL (ref 0.7–4.0)
MCH: 27.3 pg (ref 26.0–34.0)
MCHC: 31.1 g/dL (ref 30.0–36.0)
MCV: 87.7 fL (ref 80.0–100.0)
Monocytes Absolute: 0.4 10*3/uL (ref 0.1–1.0)
Monocytes Relative: 12 %
Neutro Abs: 1.2 10*3/uL — ABNORMAL LOW (ref 1.7–7.7)
Neutrophils Relative %: 33 %
Platelet Count: 265 10*3/uL (ref 150–400)
RBC: 4.14 MIL/uL (ref 3.87–5.11)
WBC Count: 3.7 10*3/uL — ABNORMAL LOW (ref 4.0–10.5)
nRBC: 0 % (ref 0.0–0.2)

## 2019-07-09 LAB — IRON AND TIBC
Iron: 79 ug/dL (ref 41–142)
Saturation Ratios: 36 % (ref 21–57)
TIBC: 221 ug/dL — ABNORMAL LOW (ref 236–444)
UIBC: 142 ug/dL (ref 120–384)

## 2019-07-09 LAB — FERRITIN: Ferritin: 590 ng/mL — ABNORMAL HIGH (ref 11–307)

## 2019-07-09 MED ORDER — SODIUM CHLORIDE 0.9% FLUSH
10.0000 mL | Freq: Once | INTRAVENOUS | Status: AC
  Administered 2019-07-09: 10 mL
  Filled 2019-07-09: qty 10

## 2019-07-09 MED ORDER — SODIUM CHLORIDE 0.9 % IV SOLN
Freq: Once | INTRAVENOUS | Status: AC
  Administered 2019-07-09: 12:00:00 via INTRAVENOUS
  Filled 2019-07-09: qty 5

## 2019-07-09 MED ORDER — SODIUM CHLORIDE 0.9 % IV SOLN
375.0000 mg/m2 | Freq: Once | INTRAVENOUS | Status: AC
  Administered 2019-07-09: 750 mg via INTRAVENOUS
  Filled 2019-07-09: qty 75

## 2019-07-09 MED ORDER — VINBLASTINE SULFATE CHEMO INJECTION 1 MG/ML
6.0000 mg/m2 | Freq: Once | INTRAVENOUS | Status: AC
  Administered 2019-07-09: 12 mg via INTRAVENOUS
  Filled 2019-07-09: qty 12

## 2019-07-09 MED ORDER — LORAZEPAM 2 MG/ML IJ SOLN
0.5000 mg | Freq: Once | INTRAMUSCULAR | Status: AC
  Administered 2019-07-09: 0.5 mg via INTRAVENOUS

## 2019-07-09 MED ORDER — PALONOSETRON HCL INJECTION 0.25 MG/5ML
0.2500 mg | Freq: Once | INTRAVENOUS | Status: AC
  Administered 2019-07-09: 0.25 mg via INTRAVENOUS

## 2019-07-09 MED ORDER — SODIUM CHLORIDE 0.9% FLUSH
10.0000 mL | INTRAVENOUS | Status: DC | PRN
  Administered 2019-07-09: 10 mL
  Filled 2019-07-09: qty 10

## 2019-07-09 MED ORDER — SODIUM CHLORIDE 0.9 % IV SOLN
Freq: Once | INTRAVENOUS | Status: AC
  Administered 2019-07-09: 12:00:00 via INTRAVENOUS
  Filled 2019-07-09: qty 250

## 2019-07-09 MED ORDER — PALONOSETRON HCL INJECTION 0.25 MG/5ML
INTRAVENOUS | Status: AC
  Filled 2019-07-09: qty 5

## 2019-07-09 MED ORDER — DOXORUBICIN HCL CHEMO IV INJECTION 2 MG/ML
25.0000 mg/m2 | Freq: Once | INTRAVENOUS | Status: AC
  Administered 2019-07-09: 50 mg via INTRAVENOUS
  Filled 2019-07-09: qty 25

## 2019-07-09 MED ORDER — LORAZEPAM 2 MG/ML IJ SOLN
INTRAMUSCULAR | Status: AC
  Filled 2019-07-09: qty 1

## 2019-07-09 MED ORDER — HEPARIN SOD (PORK) LOCK FLUSH 100 UNIT/ML IV SOLN
500.0000 [IU] | Freq: Once | INTRAVENOUS | Status: AC | PRN
  Administered 2019-07-09: 500 [IU]
  Filled 2019-07-09: qty 5

## 2019-07-09 MED ORDER — SODIUM CHLORIDE 0.9 % IV SOLN
10.0000 [IU]/m2 | Freq: Once | INTRAVENOUS | Status: AC
  Administered 2019-07-09: 20 [IU] via INTRAVENOUS
  Filled 2019-07-09: qty 6.67

## 2019-07-09 NOTE — Progress Notes (Signed)
Per Dr. Irene Limbo it is ok to treat today with ANC 1.2

## 2019-07-12 ENCOUNTER — Telehealth: Payer: Self-pay | Admitting: Hematology

## 2019-07-12 NOTE — Telephone Encounter (Signed)
Scheduled appt per 10/23 los.  Scheduled appt with pt over the phone...she is aware of her appt dates and time.

## 2019-07-13 ENCOUNTER — Ambulatory Visit: Payer: No Typology Code available for payment source | Admitting: Obstetrics and Gynecology

## 2019-07-21 NOTE — Progress Notes (Signed)
HEMATOLOGY/ONCOLOGY CLINIC NOTE  Date of Service: 07/22/2019  Patient Care Team: [name], MD as PCP - General (Family Medicine)  CHIEF COMPLAINTS/PURPOSE OF CONSULTATION:   Continue management of classical hodgkins lymphoma   CURRENT TREATMENT: Cycle 2 day one   HISTORY OF PRESENTING ILLNESS:   Elizabeth Beasley is a wonderful 38 y.o. female who has been referred to [place] by Dr Madilyn Fireman for evaluation and management of microcytic anemia/cervical lymphadenopathy.The pt reports that she has a history of iron deficiency but has never had a transfusion. Pt has had two pregnancies and has two children, ages 26 and 49. There was a concern for anemia with her second pregnancy so she was given an erythropoietin shot. She has had a copper IUD for about 2 years and her menstrual cycle has been slightly longer since getting the IUD. She has been taking 28 mg TID PO iron since June. Pt has no known medication allergies and has had a previous removal of Bartholin's gland cyst. Pt has had no known contact with chemicals either through work or hobbies. She has no history of asthma or heavy smoke/aerosol exposure.   Pt did not notice any cervical lymphadenopathy in June. In late July, early August she began to experience extreme fatigue and could begin to feel small lumps in her neck. Her fatigue is improved with taking her PO Iron TID. She has experienced a couple of episodes of night sweats, with the latest occurring in late August. Pt has also had <10 lbs loss in 2 months. She has not had any rashes but is experiencing itching in her palms, broader itching after hot showers.   Pt is currently working from home. She has had her seasonal flu shot. Pt is not planning on having more children and is not concerned about fertility. Pt's husband would like to take a drive to the [facility] next month. Pt would not want any blood product transfusions, as she is a Jehovah's Witness. She is  comfortable with IV iron infusions.   INTERVAL HISTORY:  Elizabeth Beasley is a 38 y.o. female here for evaluation and management of hodgkins lymphoma. The patient's last visit with [place] was on 07/09/2019. The pt reports that she is doing well overall.  The pt reports that the numbness in her fingers is gone. No fevers chills night sweats, skin rashes, changes in bowel habits,  She is taking vitamin  D supplements   She is not sleeping well due to medical new medical issues with her husband.  Lab results today (07/22/19) of CBC w/diff and CMP is as follows: all values are WNL except for WBC at 3.0, Hemoglobin at 11.9, RDW at 25.6, and AST at 9.  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     MEDICAL HISTORY:  Past Medical History:  Diagnosis Date  . Anemia   . Bartholin gland cyst   . 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 Hx of seasonal allergies   . Lymphadenopathy, cervical 05/21/2019  . PONV (postoperative nausea and vomiting)   . 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 CESAREAN SECTION  2010  . MASS EXCISION Right 05/21/2019   Procedure: EXCISION OF RIGHT  DEEP NECK LYMPH NODE;  Surgeon: Fanny Skates, MD;  Location: [place];  Service: General;  Laterality: Right;  . PORTACATH PLACEMENT Right 06/08/2019   Procedure: INSERTION PORT-A-CATH WITH ULTRASOUND GUIDANCE;  Surgeon: Fanny Skates, MD;  Location: [place];  Service: General;  Laterality: Right;  . URETHRAL CYST REMOVAL N/A 08/30/2016   Procedure: BARTHOLIN'S GLAND CYST EXCISION;  Surgeon: Ardis Hughs, MD;  Location: [facility];  Service: Urology;  Laterality: N/A;  . v back     2012    SOCIAL HISTORY:  Social History   Socioeconomic History  . Marital status: Married    Spouse name: Montine Circle  . Number of children: 2  . Years of education: Assoc  . Highest education level: Not on file  Occupational History  . Occupation: CMA    Employer: [employer]  . Financial resource strain: Not on file  . Food insecurity    Worry: Not on file    Inability: Not on file  . Transportation needs    Medical: Not on file    Non-medical: Not on file  Tobacco Use  . Smoking status: Never Smoker  . Smokeless tobacco: Never Used  Substance and Sexual Activity  . Alcohol use: Not Currently    Comment: ocassional  . Drug use: No  . Sexual activity: Yes    Partners: Male    Birth control/protection: I.U.D.  Lifestyle  . Physical activity    Days per week: Not on file    Minutes per session: Not on file  . Stress: Not on file  Relationships  . Social Herbalist on phone: Not on file    Gets together: Not on file    Attends religious service: Not on file    Active member of club or organization: Not on file    Attends meetings of clubs or organizations: Not on file    Relationship status: Not on file  . Intimate partner violence    Fear of current or ex partner: Not on file    Emotionally abused: Not on file    Physically abused: Not on file    Forced sexual activity: Not on file  Other Topics Concern  . Not on file  Social History Narrative   Regular exercise. No regular caffeine.    FAMILY HISTORY: Family History  Problem Relation Age of Onset  . Colon cancer Father 48  . Colon polyps Mother   . Hypertension Mother   . Diabetes Paternal Grandmother   . Stroke Paternal Grandmother   . Alcohol abuse Paternal Uncle   . Colon cancer Paternal Aunt 77  . Esophageal cancer Neg Hx   . Rectal cancer Neg Hx   . Stomach cancer Neg Hx    On PMHx the pt reports C-section, Bartholin's Gland Cyst removal. On Social Hx the pt reports non smoker, no alcohol use. On  Family Hx the pt reports father passed from colon cancer, mother has colon polyps.  ALLERGIES:  has No Known Allergies.  MEDICATIONS:  Current Outpatient Medications  Medication Sig Dispense Refill  . cetirizine (ZYRTEC) 5 MG tablet Take 5 mg by mouth daily as needed for allergies.    Marland Kitchen dexamethasone (DECADRON) 4 MG tablet Take 2 tablets by mouth once a day on the day after chemotherapy and then take 2 tablets two times a day for 2 days. Take with food. (Patient not taking: Reported on 06/15/2019) 30 tablet 1  .  LORazepam (ATIVAN) 0.5 MG tablet Take 1 tablet (0.5 mg total) by mouth every 6 (six) hours as needed (Nausea or vomiting). (Patient not taking: Reported on 06/15/2019) 30 tablet 0  . ondansetron (ZOFRAN) 8 MG tablet Take 1 tablet (8 mg total) by mouth 2 (two) times daily as needed. Start on the third day after chemotherapy. (Patient not taking: Reported on 06/15/2019) 30 tablet 1  . potassium chloride SA (KLOR-CON) 20 MEQ tablet Take 1 tablet (20 mEq total) by mouth 2 (two) times daily. 60 tablet 1  . prochlorperazine (COMPAZINE) 10 MG tablet Take 1 tablet (10 mg total) by mouth every 6 (six) hours as needed (Nausea or vomiting). (Patient not taking: Reported on 06/15/2019) 30 tablet 1   No current facility-administered medications for this visit.     REVIEW OF SYSTEMS:    A 10+ POINT REVIEW OF SYSTEMS WAS OBTAINED including neurology, dermatology, psychiatry, cardiac, respiratory, lymph, extremities, GI, GU, Musculoskeletal, constitutional, breasts, reproductive, HEENT.  All pertinent positives are noted in the HPI.  All others are negative.       PHYSICAL EXAMINATION: ECOG FS:1 - Symptomatic but completely ambulatory  Vitals:   07/22/19 0950  BP: (!) 106/59  Pulse: 91  Resp: 18  Temp: 98 F (36.7 C)  SpO2: 100%   Wt Readings from Last 3 Encounters:  07/22/19 197 lb 14.4 oz (89.8 kg)  07/09/19 196 lb 11.2 oz (89.2 kg)  07/02/19 193 lb 3.2 oz (87.6 kg)   Body mass index is  33.97 kg/m.    GENERAL:alert, in no acute distress and comfortable SKIN: no acute rashes, no significant lesions EYES: conjunctiva are pink and non-injected, sclera anicteric OROPHARYNX: MMM, no exudates, no oropharyngeal erythema or ulceration NECK: supple, no JVD LYMPH:  no palpable lymphadenopathy in the cervical, axillary or inguinal regions LUNGS: clear to auscultation b/l with normal respiratory effort HEART: regular rate & rhythm ABDOMEN:  normoactive bowel sounds , non tender, not distended. Extremity: no pedal edema PSYCH: alert & oriented x 3 with fluent speech NEURO: no focal motor/sensory deficits  NEURO: no focal motor/sensory deficits   LABORATORY DATA:  I have reviewed the data as listed  - 05/14/2019 Fe+TIBC+Fer is as follows: Iron at 11, TIBC at 258, % Sat at 4, Ferritin at 50. -Discussed 05/21/2019 Lymph Node Bx [id number]) which revealed " CLASSIC HODGKIN LYMPHOMA."  - 05/19/2019 CT C/A/P ([id number]) which revealed  "1. Enlarged mediastinal, bilateral hilar and lower cervical lymph nodes. Primary considerations include lymphoma, sarcoid and less likely metastatic disease. Normal spleen. No evidence of enlarged lymph nodes within the abdomen or pelvis. 2. Low lying IUD within the LOWER uterine segment and may extend to the cervix."  - 05/17/2019 CT Soft Tissue Neck w/contrast ([id number]) which revealed "Bulky right greater than left cervical and mediastinal lymphadenopathy concerning for lymphoma."  . CBC Latest Ref Rng & Units 07/22/2019 07/09/2019 07/02/2019  WBC 4.0 - 10.5 K/uL 3.0(L) 3.7(L) 5.6  Hemoglobin 12.0 - 15.0 g/dL 11.9(L) 11.3(L) 11.0(L)  Hematocrit 36.0 - 46.0 % 37.0 36.3 35.6(L)  Platelets 150 - 400 K/uL 280 265 357    . CMP Latest Ref Rng & Units 07/22/2019 07/09/2019 07/02/2019  Glucose 70 - 99 mg/dL 99 100(H) 95  BUN 6 - 20 mg/dL 7 10 10   Creatinine 0.44 - 1.00 mg/dL 0.72 0.69 0.69  Sodium 135 - 145 mmol/L 141 140 138  Potassium 3.5 - 5.1  mmol/L 3.6 3.5 3.8  Chloride 98 - 111 mmol/L 106 106 104  CO2 22 - 32 mmol/L 26 25 26   Calcium 8.9 - 10.3 mg/dL 9.0 8.8(L) 8.7(L)  Total Protein 6.5 - 8.1 g/dL 6.7 6.7 7.2  Total Bilirubin 0.3 - 1.2 mg/dL 0.3 0.4 0.4  Alkaline Phos 38 - 126 U/L 52 64 76  AST 15 - 41 U/L 9(L) 10(L) 10(L)  ALT 0 - 44 U/L 14 14 18    . Lab Results  Component Value Date   IRON 79 07/09/2019   TIBC 221 (L) 07/09/2019   IRONPCTSAT 36 07/09/2019   (Iron and TIBC)  Lab Results  Component Value Date   FERRITIN 590 (H) 07/09/2019    05/21/2019 [id number]) Lymph Node Biopsy    RADIOGRAPHIC STUDIES: I have personally reviewed the radiological images as listed and agreed with the findings in the report. No results found.  ASSESSMENT & PLAN:   1) Newly diagnosed Classical Hodgkins lymphoma Likely stage IIIB based on imaging thus far. 2) Microcytic Anemia due to anemia of chronic disease and Iron deficiency- hg improved to 11.3 3) Jehovas Witness   PLAN: -Discussed pt labwork today, 07/22/19; CBC w/diff and CMP is as follows: all values are WNL except for WBC at 3.0, Hemoglobin at 11.9, RDW at 25.6, and AST at 9  -Discussed that blood counts are improving AND HGB improved to 11.9 -Discussed starting Granix to stimulate white blood cells. Will set up Granix shots today to be started next week. Advised that the side effects may include body aches.  -Advised that lymph nodes in neck are improving  -Recommended using an over the counter antibiotic ointment for ingrown hair in right armpit. If it becomes infected will start antibiotics.   FOLLOW UP: -Proceed with C2D1 of treatment as scheduled on 07/23/2019. -Plz schedule for granix daily from 11/9 to 11/12 for neutropenia -Plz f/u for C2D15 of treatment as scheduled on 08/05/2019. -Plz add MD visit to labs and treatment on 08/05/2019   The total time spent in the appt was 25 minutes and more than 50% was on counseling and direct patient cares.  All  of the patient's questions were answered with apparent satisfaction. The patient knows to call the clinic with any problems, questions or concerns.    Sullivan Lone MD MS AAHIVMS [facility] Hematology/Oncology Physician [facility]  (Office):       [phone] (Work cell):  [phone] (Fax):           [phone]  07/22/2019 4:24 PM  I, Scot Dock, am acting as a scribe for Dr. Sullivan Lone.   .I have reviewed the above documentation for accuracy and completeness, and I agree with the above. Brunetta Genera MD

## 2019-07-22 ENCOUNTER — Telehealth: Payer: Self-pay | Admitting: Hematology

## 2019-07-22 ENCOUNTER — Other Ambulatory Visit: Payer: Self-pay

## 2019-07-22 ENCOUNTER — Inpatient Hospital Stay: Payer: No Typology Code available for payment source | Attending: Hematology

## 2019-07-22 ENCOUNTER — Inpatient Hospital Stay (HOSPITAL_BASED_OUTPATIENT_CLINIC_OR_DEPARTMENT_OTHER): Payer: No Typology Code available for payment source | Admitting: Hematology

## 2019-07-22 ENCOUNTER — Inpatient Hospital Stay: Payer: No Typology Code available for payment source

## 2019-07-22 VITALS — BP 106/59 | HR 91 | Temp 98.0°F | Resp 18 | Ht 64.0 in | Wt 197.9 lb

## 2019-07-22 DIAGNOSIS — C8111 Nodular sclerosis classical Hodgkin lymphoma, lymph nodes of head, face, and neck: Secondary | ICD-10-CM | POA: Diagnosis not present

## 2019-07-22 DIAGNOSIS — D509 Iron deficiency anemia, unspecified: Secondary | ICD-10-CM | POA: Diagnosis not present

## 2019-07-22 DIAGNOSIS — C8118 Nodular sclerosis classical Hodgkin lymphoma, lymph nodes of multiple sites: Secondary | ICD-10-CM

## 2019-07-22 DIAGNOSIS — Z79899 Other long term (current) drug therapy: Secondary | ICD-10-CM | POA: Insufficient documentation

## 2019-07-22 DIAGNOSIS — Z95828 Presence of other vascular implants and grafts: Secondary | ICD-10-CM

## 2019-07-22 DIAGNOSIS — D709 Neutropenia, unspecified: Secondary | ICD-10-CM

## 2019-07-22 DIAGNOSIS — Z5111 Encounter for antineoplastic chemotherapy: Secondary | ICD-10-CM | POA: Insufficient documentation

## 2019-07-22 DIAGNOSIS — R61 Generalized hyperhidrosis: Secondary | ICD-10-CM | POA: Diagnosis not present

## 2019-07-22 DIAGNOSIS — D500 Iron deficiency anemia secondary to blood loss (chronic): Secondary | ICD-10-CM

## 2019-07-22 DIAGNOSIS — Z7189 Other specified counseling: Secondary | ICD-10-CM

## 2019-07-22 LAB — CBC WITH DIFFERENTIAL/PLATELET
Abs Immature Granulocytes: 0 10*3/uL (ref 0.00–0.07)
Basophils Absolute: 0 10*3/uL (ref 0.0–0.1)
Basophils Relative: 1 %
Eosinophils Absolute: 0.1 10*3/uL (ref 0.0–0.5)
Eosinophils Relative: 2 %
HCT: 37 % (ref 36.0–46.0)
Hemoglobin: 11.9 g/dL — ABNORMAL LOW (ref 12.0–15.0)
Lymphocytes Relative: 57 %
Lymphs Abs: 1.7 10*3/uL (ref 0.7–4.0)
MCH: 28.3 pg (ref 26.0–34.0)
MCHC: 32.2 g/dL (ref 30.0–36.0)
MCV: 88.1 fL (ref 80.0–100.0)
Monocytes Absolute: 0.4 10*3/uL (ref 0.1–1.0)
Monocytes Relative: 12 %
Neutro Abs: 0.8 10*3/uL — ABNORMAL LOW (ref 1.7–7.7)
Neutrophils Relative %: 28 %
Platelets: 280 10*3/uL (ref 150–400)
RBC: 4.2 MIL/uL (ref 3.87–5.11)
RDW: 25.6 % — ABNORMAL HIGH (ref 11.5–15.5)
WBC: 3 10*3/uL — ABNORMAL LOW (ref 4.0–10.5)
nRBC: 0 % (ref 0.0–0.2)

## 2019-07-22 LAB — CMP (CANCER CENTER ONLY)
ALT: 14 U/L (ref 0–44)
AST: 9 U/L — ABNORMAL LOW (ref 15–41)
Albumin: 3.5 g/dL (ref 3.5–5.0)
Alkaline Phosphatase: 52 U/L (ref 38–126)
Anion gap: 9 (ref 5–15)
BUN: 7 mg/dL (ref 6–20)
CO2: 26 mmol/L (ref 22–32)
Calcium: 9 mg/dL (ref 8.9–10.3)
Chloride: 106 mmol/L (ref 98–111)
Creatinine: 0.72 mg/dL (ref 0.44–1.00)
GFR, Est AFR Am: >60 mL/min (ref >60)
GFR, Estimated: >60 mL/min (ref >60)
Glucose, Bld: 99 mg/dL (ref 70–99)
Potassium: 3.6 mmol/L (ref 3.5–5.1)
Sodium: 141 mmol/L (ref 135–145)
Total Bilirubin: 0.3 mg/dL (ref 0.3–1.2)
Total Protein: 6.7 g/dL (ref 6.5–8.1)

## 2019-07-22 MED ORDER — HEPARIN SOD (PORK) LOCK FLUSH 100 UNIT/ML IV SOLN
500.0000 [IU] | Freq: Once | INTRAVENOUS | Status: AC
  Administered 2019-07-22: 500 [IU]
  Filled 2019-07-22: qty 5

## 2019-07-22 MED ORDER — SODIUM CHLORIDE 0.9% FLUSH
10.0000 mL | Freq: Once | INTRAVENOUS | Status: AC
  Administered 2019-07-22: 10 mL
  Filled 2019-07-22: qty 10

## 2019-07-22 NOTE — Telephone Encounter (Signed)
Scheduled per 11/05 los, patient received updated calender.

## 2019-07-22 NOTE — Progress Notes (Signed)
Verbal order per Dr.Kale - OK to treat on 07/23/2019 with 07/22/2019 ANC results

## 2019-07-22 NOTE — Patient Instructions (Signed)

## 2019-07-23 ENCOUNTER — Other Ambulatory Visit: Payer: No Typology Code available for payment source

## 2019-07-23 ENCOUNTER — Inpatient Hospital Stay: Payer: No Typology Code available for payment source

## 2019-07-23 ENCOUNTER — Other Ambulatory Visit: Payer: Self-pay

## 2019-07-23 VITALS — BP 109/68 | HR 66 | Temp 98.7°F | Resp 18

## 2019-07-23 DIAGNOSIS — Z7189 Other specified counseling: Secondary | ICD-10-CM

## 2019-07-23 DIAGNOSIS — C8111 Nodular sclerosis classical Hodgkin lymphoma, lymph nodes of head, face, and neck: Secondary | ICD-10-CM

## 2019-07-23 DIAGNOSIS — Z5111 Encounter for antineoplastic chemotherapy: Secondary | ICD-10-CM | POA: Diagnosis not present

## 2019-07-23 MED ORDER — LORAZEPAM 2 MG/ML IJ SOLN
0.5000 mg | Freq: Once | INTRAMUSCULAR | Status: AC
  Administered 2019-07-23: 13:00:00 0.5 mg via INTRAVENOUS

## 2019-07-23 MED ORDER — PALONOSETRON HCL INJECTION 0.25 MG/5ML
INTRAVENOUS | Status: AC
  Filled 2019-07-23: qty 5

## 2019-07-23 MED ORDER — VINBLASTINE SULFATE CHEMO INJECTION 1 MG/ML
6.0000 mg/m2 | Freq: Once | INTRAVENOUS | Status: AC
  Administered 2019-07-23: 15:00:00 12 mg via INTRAVENOUS
  Filled 2019-07-23: qty 12

## 2019-07-23 MED ORDER — HEPARIN SOD (PORK) LOCK FLUSH 100 UNIT/ML IV SOLN
500.0000 [IU] | Freq: Once | INTRAVENOUS | Status: AC | PRN
  Administered 2019-07-23: 16:00:00 500 [IU]
  Filled 2019-07-23: qty 5

## 2019-07-23 MED ORDER — SODIUM CHLORIDE 0.9% FLUSH
10.0000 mL | INTRAVENOUS | Status: DC | PRN
  Administered 2019-07-23: 16:00:00 10 mL
  Filled 2019-07-23: qty 10

## 2019-07-23 MED ORDER — SODIUM CHLORIDE 0.9 % IV SOLN
Freq: Once | INTRAVENOUS | Status: AC
  Administered 2019-07-23: 13:00:00 via INTRAVENOUS
  Filled 2019-07-23: qty 250

## 2019-07-23 MED ORDER — PALONOSETRON HCL INJECTION 0.25 MG/5ML
0.2500 mg | Freq: Once | INTRAVENOUS | Status: AC
  Administered 2019-07-23: 13:00:00 0.25 mg via INTRAVENOUS

## 2019-07-23 MED ORDER — SODIUM CHLORIDE 0.9 % IV SOLN
10.0000 [IU]/m2 | Freq: Once | INTRAVENOUS | Status: AC
  Administered 2019-07-23: 15:00:00 20 [IU] via INTRAVENOUS
  Filled 2019-07-23: qty 6.67

## 2019-07-23 MED ORDER — SODIUM CHLORIDE 0.9 % IV SOLN
Freq: Once | INTRAVENOUS | Status: AC
  Administered 2019-07-23: 13:00:00 via INTRAVENOUS
  Filled 2019-07-23: qty 5

## 2019-07-23 MED ORDER — DOXORUBICIN HCL CHEMO IV INJECTION 2 MG/ML
25.0000 mg/m2 | Freq: Once | INTRAVENOUS | Status: AC
  Administered 2019-07-23: 50 mg via INTRAVENOUS
  Filled 2019-07-23: qty 25

## 2019-07-23 MED ORDER — LORAZEPAM 2 MG/ML IJ SOLN
INTRAMUSCULAR | Status: AC
  Filled 2019-07-23: qty 1

## 2019-07-23 MED ORDER — SODIUM CHLORIDE 0.9 % IV SOLN
375.0000 mg/m2 | Freq: Once | INTRAVENOUS | Status: AC
  Administered 2019-07-23: 15:00:00 750 mg via INTRAVENOUS
  Filled 2019-07-23: qty 75

## 2019-07-26 ENCOUNTER — Other Ambulatory Visit: Payer: Self-pay

## 2019-07-26 ENCOUNTER — Inpatient Hospital Stay: Payer: No Typology Code available for payment source

## 2019-07-26 VITALS — BP 118/72 | HR 62 | Temp 98.2°F | Resp 18

## 2019-07-26 DIAGNOSIS — C8111 Nodular sclerosis classical Hodgkin lymphoma, lymph nodes of head, face, and neck: Secondary | ICD-10-CM

## 2019-07-26 DIAGNOSIS — Z5111 Encounter for antineoplastic chemotherapy: Secondary | ICD-10-CM | POA: Diagnosis not present

## 2019-07-26 DIAGNOSIS — Z7189 Other specified counseling: Secondary | ICD-10-CM

## 2019-07-26 MED ORDER — TBO-FILGRASTIM 480 MCG/0.8ML ~~LOC~~ SOSY
PREFILLED_SYRINGE | SUBCUTANEOUS | Status: AC
  Filled 2019-07-26: qty 0.8

## 2019-07-26 MED ORDER — TBO-FILGRASTIM 480 MCG/0.8ML ~~LOC~~ SOSY
480.0000 ug | PREFILLED_SYRINGE | Freq: Once | SUBCUTANEOUS | Status: AC
  Administered 2019-07-26: 480 ug via SUBCUTANEOUS

## 2019-07-26 NOTE — Patient Instructions (Signed)
Tbo-Filgrastim injection What is this medicine? TBO-FILGRASTIM (T B O fil GRA stim) is a granulocyte colony-stimulating factor that helps you make more neutrophils, a type of white blood cell. Neutrophils are important for fighting infections. Some chemotherapy affects your bone marrow and lowers your neutrophils. This medicine helps decrease the length of time that neutrophils are very low (severe neutropenia). This medicine may be used for other purposes; ask your health care provider or pharmacist if you have questions. COMMON BRAND NAME(S): Granix What should I tell my health care provider before I take this medicine? They need to know if you have any of these conditions:  bone scan or tests planned  kidney disease  sickle cell anemia  an unusual or allergic reaction to tbo-filgrastim, filgrastim, pegfilgrastim, other medicines, foods, dyes, or preservatives  pregnant or trying to get pregnant  breast-feeding How should I use this medicine? This medicine is for injection under the skin. If you get this medicine at home, you will be taught how to prepare and give this medicine. Refer to the Instructions for Use that come with your medication packaging. Use exactly as directed. Take your medicine at regular intervals. Do not take your medicine more often than directed. It is important that you put your used needles and syringes in a special sharps container. Do not put them in a trash can. If you do not have a sharps container, call your pharmacist or healthcare provider to get one. Talk to your pediatrician regarding the use of this medicine in children. While this drug may be prescribed for children as young as 1 month of age for selected conditions, precautions do apply. Overdosage: If you think you have taken too much of this medicine contact a poison control center or emergency room at once. NOTE: This medicine is only for you. Do not share this medicine with others. What if I miss a  dose? It is important not to miss your dose. Call your doctor or health care professional if you miss a dose. What may interact with this medicine? This medicine may interact with the following medications:  medicines that may cause a release of neutrophils, such as lithium This list may not describe all possible interactions. Give your health care provider a list of all the medicines, herbs, non-prescription drugs, or dietary supplements you use. Also tell them if you smoke, drink alcohol, or use illegal drugs. Some items may interact with your medicine. What should I watch for while using this medicine? You may need blood work done while you are taking this medicine. What side effects may I notice from receiving this medicine? Side effects that you should report to your doctor or health care professional as soon as possible:  allergic reactions like skin rash, itching or hives, swelling of the face, lips, or tongue  back pain  blood in the urine  dark urine  dizziness  fast heartbeat  feeling faint  shortness of breath or breathing problems  signs and symptoms of infection like fever or chills; cough; or sore throat  signs and symptoms of kidney injury like trouble passing urine or change in the amount of urine  stomach or side pain, or pain at the shoulder  sweating  swelling of the legs, ankles, or abdomen  tiredness Side effects that usually do not require medical attention (report to your doctor or health care professional if they continue or are bothersome):  bone pain  diarrhea  headache  muscle pain  vomiting This list may   not describe all possible side effects. Call your doctor for medical advice about side effects. You may report side effects to FDA at 1-800-FDA-1088. Where should I keep my medicine? Keep out of the reach of children. Store in a refrigerator between 2 and 8 degrees C (36 and 46 degrees F). Keep in carton to protect from light. Throw  away this medicine if it is left out of the refrigerator for more than 5 consecutive days. Throw away any unused medicine after the expiration date. NOTE: This sheet is a summary. It may not cover all possible information. If you have questions about this medicine, talk to your doctor, pharmacist, or health care provider.  2020 Elsevier/Gold Standard (2018-07-04 19:58:39)  

## 2019-07-27 ENCOUNTER — Other Ambulatory Visit: Payer: Self-pay

## 2019-07-27 ENCOUNTER — Inpatient Hospital Stay: Payer: No Typology Code available for payment source

## 2019-07-27 VITALS — BP 116/72 | HR 62 | Temp 98.2°F | Resp 18

## 2019-07-27 DIAGNOSIS — Z5111 Encounter for antineoplastic chemotherapy: Secondary | ICD-10-CM | POA: Diagnosis not present

## 2019-07-27 DIAGNOSIS — Z7189 Other specified counseling: Secondary | ICD-10-CM

## 2019-07-27 DIAGNOSIS — C8111 Nodular sclerosis classical Hodgkin lymphoma, lymph nodes of head, face, and neck: Secondary | ICD-10-CM

## 2019-07-27 MED ORDER — TBO-FILGRASTIM 480 MCG/0.8ML ~~LOC~~ SOSY
480.0000 ug | PREFILLED_SYRINGE | Freq: Once | SUBCUTANEOUS | Status: AC
  Administered 2019-07-27: 480 ug via SUBCUTANEOUS

## 2019-07-27 MED ORDER — TBO-FILGRASTIM 480 MCG/0.8ML ~~LOC~~ SOSY
PREFILLED_SYRINGE | SUBCUTANEOUS | Status: AC
  Filled 2019-07-27: qty 0.8

## 2019-07-28 ENCOUNTER — Other Ambulatory Visit: Payer: Self-pay

## 2019-07-28 ENCOUNTER — Inpatient Hospital Stay: Payer: No Typology Code available for payment source

## 2019-07-28 VITALS — BP 118/62 | HR 61 | Temp 98.2°F | Resp 18

## 2019-07-28 DIAGNOSIS — Z5111 Encounter for antineoplastic chemotherapy: Secondary | ICD-10-CM | POA: Diagnosis not present

## 2019-07-28 DIAGNOSIS — C8111 Nodular sclerosis classical Hodgkin lymphoma, lymph nodes of head, face, and neck: Secondary | ICD-10-CM

## 2019-07-28 DIAGNOSIS — Z7189 Other specified counseling: Secondary | ICD-10-CM

## 2019-07-28 MED ORDER — TBO-FILGRASTIM 480 MCG/0.8ML ~~LOC~~ SOSY
PREFILLED_SYRINGE | SUBCUTANEOUS | Status: AC
  Filled 2019-07-28: qty 0.8

## 2019-07-28 MED ORDER — TBO-FILGRASTIM 480 MCG/0.8ML ~~LOC~~ SOSY
480.0000 ug | PREFILLED_SYRINGE | Freq: Once | SUBCUTANEOUS | Status: AC
  Administered 2019-07-28: 480 ug via SUBCUTANEOUS

## 2019-07-29 ENCOUNTER — Telehealth: Payer: Self-pay | Admitting: *Deleted

## 2019-07-29 ENCOUNTER — Other Ambulatory Visit: Payer: Self-pay

## 2019-07-29 ENCOUNTER — Inpatient Hospital Stay: Payer: No Typology Code available for payment source

## 2019-07-29 VITALS — BP 121/72 | HR 68 | Temp 98.2°F | Resp 18

## 2019-07-29 DIAGNOSIS — Z5111 Encounter for antineoplastic chemotherapy: Secondary | ICD-10-CM | POA: Diagnosis not present

## 2019-07-29 DIAGNOSIS — Z7189 Other specified counseling: Secondary | ICD-10-CM

## 2019-07-29 DIAGNOSIS — C8111 Nodular sclerosis classical Hodgkin lymphoma, lymph nodes of head, face, and neck: Secondary | ICD-10-CM

## 2019-07-29 MED ORDER — TBO-FILGRASTIM 480 MCG/0.8ML ~~LOC~~ SOSY
PREFILLED_SYRINGE | SUBCUTANEOUS | Status: AC
  Filled 2019-07-29: qty 0.8

## 2019-07-29 MED ORDER — TBO-FILGRASTIM 480 MCG/0.8ML ~~LOC~~ SOSY
480.0000 ug | PREFILLED_SYRINGE | Freq: Once | SUBCUTANEOUS | Status: AC
  Administered 2019-07-29: 480 ug via SUBCUTANEOUS

## 2019-07-29 NOTE — Telephone Encounter (Signed)
Patient called - states she discussed hair bump under right arm last week with Dr. Irene Limbo. She wanted him to know it is a little sore. It is smaller now, not hot or red, no streaks.Only change is that it is a little sore. Per Dr.Kale - advised patient to continue  warm compresses and over the counter topical antibiotic ointment twice daily as/if needed. Patient verbalized understanding.

## 2019-08-02 MED FILL — DEXAMETHASONE 4 MG TABLET: 4 | 9 days supply | Qty: 30 | Fill #0

## 2019-08-05 ENCOUNTER — Inpatient Hospital Stay: Payer: No Typology Code available for payment source

## 2019-08-05 ENCOUNTER — Inpatient Hospital Stay (HOSPITAL_BASED_OUTPATIENT_CLINIC_OR_DEPARTMENT_OTHER): Payer: No Typology Code available for payment source | Admitting: Hematology

## 2019-08-05 ENCOUNTER — Other Ambulatory Visit: Payer: Self-pay

## 2019-08-05 ENCOUNTER — Telehealth: Payer: Self-pay | Admitting: Hematology

## 2019-08-05 VITALS — BP 111/67 | HR 107 | Temp 97.8°F | Resp 18 | Ht 64.0 in | Wt 201.3 lb

## 2019-08-05 VITALS — HR 78

## 2019-08-05 DIAGNOSIS — D709 Neutropenia, unspecified: Secondary | ICD-10-CM

## 2019-08-05 DIAGNOSIS — C8118 Nodular sclerosis classical Hodgkin lymphoma, lymph nodes of multiple sites: Secondary | ICD-10-CM

## 2019-08-05 DIAGNOSIS — Z7189 Other specified counseling: Secondary | ICD-10-CM

## 2019-08-05 DIAGNOSIS — Z5111 Encounter for antineoplastic chemotherapy: Secondary | ICD-10-CM

## 2019-08-05 DIAGNOSIS — C8111 Nodular sclerosis classical Hodgkin lymphoma, lymph nodes of head, face, and neck: Secondary | ICD-10-CM

## 2019-08-05 DIAGNOSIS — D500 Iron deficiency anemia secondary to blood loss (chronic): Secondary | ICD-10-CM

## 2019-08-05 DIAGNOSIS — Z95828 Presence of other vascular implants and grafts: Secondary | ICD-10-CM

## 2019-08-05 LAB — CBC WITH DIFFERENTIAL/PLATELET
Abs Immature Granulocytes: 0.43 10*3/uL — ABNORMAL HIGH (ref 0.00–0.07)
Basophils Absolute: 0 10*3/uL (ref 0.0–0.1)
Basophils Relative: 0 %
Eosinophils Absolute: 0.1 10*3/uL (ref 0.0–0.5)
Eosinophils Relative: 1 %
HCT: 37.2 % (ref 36.0–46.0)
Hemoglobin: 12.1 g/dL (ref 12.0–15.0)
Immature Granulocytes: 6 %
Lymphocytes Relative: 30 %
Lymphs Abs: 2.4 10*3/uL (ref 0.7–4.0)
MCH: 28.8 pg (ref 26.0–34.0)
MCHC: 32.5 g/dL (ref 30.0–36.0)
MCV: 88.6 fL (ref 80.0–100.0)
Monocytes Absolute: 0.6 10*3/uL (ref 0.1–1.0)
Monocytes Relative: 7 %
Neutro Abs: 4.4 10*3/uL (ref 1.7–7.7)
Neutrophils Relative %: 56 %
Platelets: 226 10*3/uL (ref 150–400)
RBC: 4.2 MIL/uL (ref 3.87–5.11)
RDW: 24.3 % — ABNORMAL HIGH (ref 11.5–15.5)
WBC: 7.9 10*3/uL (ref 4.0–10.5)
nRBC: 0 % (ref 0.0–0.2)

## 2019-08-05 LAB — CMP (CANCER CENTER ONLY)
ALT: 17 U/L (ref 0–44)
AST: 11 U/L — ABNORMAL LOW (ref 15–41)
Albumin: 3.6 g/dL (ref 3.5–5.0)
Alkaline Phosphatase: 57 U/L (ref 38–126)
Anion gap: 11 (ref 5–15)
BUN: 9 mg/dL (ref 6–20)
CO2: 25 mmol/L (ref 22–32)
Calcium: 8.9 mg/dL (ref 8.9–10.3)
Chloride: 105 mmol/L (ref 98–111)
Creatinine: 0.7 mg/dL (ref 0.44–1.00)
GFR, Est AFR Am: >60 mL/min (ref >60)
GFR, Estimated: >60 mL/min (ref >60)
Glucose, Bld: 96 mg/dL (ref 70–99)
Potassium: 3.7 mmol/L (ref 3.5–5.1)
Sodium: 141 mmol/L (ref 135–145)
Total Bilirubin: 0.3 mg/dL (ref 0.3–1.2)
Total Protein: 6.6 g/dL (ref 6.5–8.1)

## 2019-08-05 MED ORDER — SODIUM CHLORIDE 0.9% FLUSH
10.0000 mL | INTRAVENOUS | Status: DC | PRN
  Administered 2019-08-05: 10 mL
  Filled 2019-08-05: qty 10

## 2019-08-05 MED ORDER — VINBLASTINE SULFATE CHEMO INJECTION 1 MG/ML
6.0000 mg/m2 | Freq: Once | INTRAVENOUS | Status: AC
  Administered 2019-08-05: 12 mg via INTRAVENOUS
  Filled 2019-08-05: qty 12

## 2019-08-05 MED ORDER — PALONOSETRON HCL INJECTION 0.25 MG/5ML
INTRAVENOUS | Status: AC
  Filled 2019-08-05: qty 5

## 2019-08-05 MED ORDER — SODIUM CHLORIDE 0.9% FLUSH
10.0000 mL | Freq: Once | INTRAVENOUS | Status: AC
  Administered 2019-08-05: 10 mL
  Filled 2019-08-05: qty 10

## 2019-08-05 MED ORDER — LORAZEPAM 2 MG/ML IJ SOLN
0.5000 mg | Freq: Once | INTRAMUSCULAR | Status: AC
  Administered 2019-08-05: 0.5 mg via INTRAVENOUS

## 2019-08-05 MED ORDER — DOXORUBICIN HCL CHEMO IV INJECTION 2 MG/ML
25.0000 mg/m2 | Freq: Once | INTRAVENOUS | Status: AC
  Administered 2019-08-05: 50 mg via INTRAVENOUS
  Filled 2019-08-05: qty 25

## 2019-08-05 MED ORDER — SODIUM CHLORIDE 0.9 % IV SOLN
Freq: Once | INTRAVENOUS | Status: AC
  Administered 2019-08-05: 14:00:00 via INTRAVENOUS
  Filled 2019-08-05: qty 250

## 2019-08-05 MED ORDER — SODIUM CHLORIDE 0.9 % IV SOLN
10.0000 [IU]/m2 | Freq: Once | INTRAVENOUS | Status: AC
  Administered 2019-08-05: 20 [IU] via INTRAVENOUS
  Filled 2019-08-05: qty 6.67

## 2019-08-05 MED ORDER — HEPARIN SOD (PORK) LOCK FLUSH 100 UNIT/ML IV SOLN
500.0000 [IU] | Freq: Once | INTRAVENOUS | Status: AC | PRN
  Administered 2019-08-05: 500 [IU]
  Filled 2019-08-05: qty 5

## 2019-08-05 MED ORDER — PALONOSETRON HCL INJECTION 0.25 MG/5ML
0.2500 mg | Freq: Once | INTRAVENOUS | Status: AC
  Administered 2019-08-05: 0.25 mg via INTRAVENOUS

## 2019-08-05 MED ORDER — SODIUM CHLORIDE 0.9 % IV SOLN
375.0000 mg/m2 | Freq: Once | INTRAVENOUS | Status: AC
  Administered 2019-08-05: 750 mg via INTRAVENOUS
  Filled 2019-08-05: qty 75

## 2019-08-05 MED ORDER — SODIUM CHLORIDE 0.9 % IV SOLN
Freq: Once | INTRAVENOUS | Status: AC
  Administered 2019-08-05: 14:00:00 via INTRAVENOUS
  Filled 2019-08-05: qty 5

## 2019-08-05 MED ORDER — LORAZEPAM 2 MG/ML IJ SOLN
INTRAMUSCULAR | Status: AC
  Filled 2019-08-05: qty 1

## 2019-08-05 NOTE — Progress Notes (Signed)
HEMATOLOGY/ONCOLOGY CLINIC NOTE  Date of Service: 08/05/19   Patient Care Team: [name], MD as PCP - General (Family Medicine)  CHIEF COMPLAINTS/PURPOSE OF CONSULTATION:   Continue management of classical hodgkins lymphoma   CURRENT TREATMENT: Cycle 2 day 15   HISTORY OF PRESENTING ILLNESS:   Elizabeth Beasley is a wonderful 38 y.o. female who has been referred to [place] by Dr Madilyn Fireman for evaluation and management of microcytic anemia/cervical lymphadenopathy.The pt reports that she has a history of iron deficiency but has never had a transfusion. Pt has had two pregnancies and has two children, ages 65 and 18. There was a concern for anemia with her second pregnancy so she was given an erythropoietin shot. She has had a copper IUD for about 2 years and her menstrual cycle has been slightly longer since getting the IUD. She has been taking 28 mg TID PO iron since June. Pt has no known medication allergies and has had a previous removal of Bartholin's gland cyst. Pt has had no known contact with chemicals either through work or hobbies. She has no history of asthma or heavy smoke/aerosol exposure.   Pt did not notice any cervical lymphadenopathy in June. In late July, early August she began to experience extreme fatigue and could begin to feel small lumps in her neck. Her fatigue is improved with taking her PO Iron TID. She has experienced a couple of episodes of night sweats, with the latest occurring in late August. Pt has also had <10 lbs loss in 2 months. She has not had any rashes but is experiencing itching in her palms, broader itching after hot showers.   Pt is currently working from home. She has had her seasonal flu shot. Pt is not planning on having more children and is not concerned about fertility. Pt's husband would like to take a drive to the [facility] next month. Pt would not want any blood product transfusions, as she is a Jehovah's Witness. She is  comfortable with IV iron infusions.   INTERVAL HISTORY:  Elizabeth Beasley is a 38 y.o. female here for evaluation and management of hodgkins lymphoma. The patient's last visit with [place] was on 07/22/2019. The pt reports that she is doing well overall.  The pt reports body pain that gradually build up over the week. On a scale from 1-10 her pain was at a 8.  Lab results today (08/05/19) of CBC w/diff and CMP is as follows: all values are WNL except for AST at 11, RDW at 24.3, Abs Immature Granulocytes at 0.43.  On review of systems, pt reports reactions from medications and denies rashes, fevers, chills, new lumps or bumps, abdominal pain, leg swelling and any other symptoms.    MEDICAL HISTORY:  Past Medical History:  Diagnosis Date  . Anemia   . Bartholin gland cyst   . 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 Hx of seasonal allergies   . Lymphadenopathy, cervical 05/21/2019  . PONV (postoperative nausea and vomiting)   . 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 CESAREAN SECTION  2010  . MASS EXCISION Right 05/21/2019   Procedure: EXCISION OF RIGHT DEEP NECK LYMPH NODE;  Surgeon: Fanny Skates, MD;  Location: [facility];  Service: General;  Laterality: Right;  . PORTACATH PLACEMENT Right 06/08/2019   Procedure: INSERTION PORT-A-CATH WITH ULTRASOUND GUIDANCE;  Surgeon: Fanny Skates, MD;  Location: [facility];  Service: General;  Laterality: Right;  . URETHRAL CYST REMOVAL N/A 08/30/2016   Procedure: BARTHOLIN'S GLAND CYST EXCISION;  Surgeon: Ardis Hughs, MD;  Location: [facility];  Service: Urology;  Laterality: N/A;  . v back     2012    SOCIAL HISTORY: Social History   Socioeconomic History  . Marital status: Married    Spouse name: Montine Circle  . Number of children: 2  . Years of education: Assoc  . Highest education level:  Not on file  Occupational History  . Occupation: CMA    Employer: [employer]  . Financial resource strain: Not on file  . Food insecurity    Worry: Not on file    Inability: Not on file  . Transportation needs    Medical: Not on file    Non-medical: Not on file  Tobacco Use  . Smoking status: Never Smoker  . Smokeless tobacco: Never Used  Substance and Sexual Activity  . Alcohol use: Not Currently    Comment: ocassional  . Drug use: No  . Sexual activity: Yes    Partners: Male    Birth control/protection: I.U.D.  Lifestyle  . Physical activity    Days per week: Not on file    Minutes per session: Not on file  . Stress: Not on file  Relationships  . Social Herbalist on phone: Not on file    Gets together: Not on file    Attends religious service: Not on file    Active member of club or organization: Not on file    Attends meetings of clubs or organizations: Not on file    Relationship status: Not on file  . Intimate partner violence    Fear of current or ex partner: Not on file    Emotionally abused: Not on file    Physically abused: Not on file    Forced sexual activity: Not on file  Other Topics Concern  . Not on file  Social History Narrative   Regular exercise. No regular caffeine.    FAMILY HISTORY: Family History  Problem Relation Age of Onset  . Colon cancer Father 46  . Colon polyps Mother   . Hypertension Mother   . Diabetes Paternal Grandmother   . Stroke Paternal Grandmother   . Alcohol abuse Paternal Uncle   . Colon cancer Paternal Aunt 76  . Esophageal cancer Neg Hx   . Rectal cancer Neg Hx   . Stomach cancer Neg Hx    On PMHx the pt reports C-section, Bartholin's Gland Cyst removal. On Social Hx the pt reports non smoker, no alcohol use. On Family Hx the pt reports father passed from colon cancer, mother has colon polyps.  ALLERGIES:  has No Known Allergies.  MEDICATIONS:  Current Outpatient Medications  Medication  Sig Dispense Refill  . cetirizine (ZYRTEC) 5 MG tablet Take 5 mg by mouth daily as needed for allergies.    Marland Kitchen dexamethasone (DECADRON) 4 MG tablet Take 2 tablets by mouth once a day on the day after chemotherapy and then take 2 tablets two times a day for 2 days. Take with food. (Patient not taking: Reported on 06/15/2019) 30 tablet 1  . LORazepam (ATIVAN) 0.5 MG tablet Take 1 tablet (0.5 mg total) by mouth every 6 (six) hours as needed (Nausea or vomiting). (Patient not taking: Reported on 06/15/2019) 30 tablet 0  . ondansetron (  ZOFRAN) 8 MG tablet Take 1 tablet (8 mg total) by mouth 2 (two) times daily as needed. Start on the third day after chemotherapy. (Patient not taking: Reported on 06/15/2019) 30 tablet 1  . potassium chloride SA (KLOR-CON) 20 MEQ tablet Take 1 tablet (20 mEq total) by mouth 2 (two) times daily. 60 tablet 1  . prochlorperazine (COMPAZINE) 10 MG tablet Take 1 tablet (10 mg total) by mouth every 6 (six) hours as needed (Nausea or vomiting). (Patient not taking: Reported on 06/15/2019) 30 tablet 1   No current facility-administered medications for this visit.     REVIEW OF SYSTEMS:    A 10+ POINT REVIEW OF SYSTEMS WAS OBTAINED including neurology, dermatology, psychiatry, cardiac, respiratory, lymph, extremities, GI, GU, Musculoskeletal, constitutional, breasts, reproductive, HEENT.  All pertinent positives are noted in the HPI.  All others are negative.    PHYSICAL EXAMINATION: ECOG FS:1 - Symptomatic but completely ambulatory  Vitals:   08/05/19 1215  BP: 111/67  Pulse: (!) 107  Resp: 18  Temp: 97.8 F (36.6 C)  SpO2: 100%   Wt Readings from Last 3 Encounters:  08/05/19 201 lb 4.8 oz (91.3 kg)  07/22/19 197 lb 14.4 oz (89.8 kg)  07/09/19 196 lb 11.2 oz (89.2 kg)   Body mass index is 34.55 kg/m.    GENERAL:alert, in no acute distress and comfortable SKIN: no acute rashes, no significant lesions EYES: conjunctiva are pink and non-injected, sclera anicteric  OROPHARYNX: MMM, no exudates, no oropharyngeal erythema or ulceration NECK: supple, no JVD LYMPH:  no palpable lymphadenopathy in the cervical, axillary or inguinal regions LUNGS: clear to auscultation b/l with normal respiratory effort HEART: regular rate & rhythm ABDOMEN:  normoactive bowel sounds , non tender, not distended. Extremity: no pedal edema PSYCH: alert & oriented x 3 with fluent speech NEURO: no focal motor/sensory deficits    LABORATORY DATA:  I have reviewed the data as listed  - 05/14/2019 Fe+TIBC+Fer is as follows: Iron at 11, TIBC at 258, % Sat at 4, Ferritin at 50. -Discussed 05/21/2019 Lymph Node Bx [id number]) which revealed " CLASSIC HODGKIN LYMPHOMA."  - 05/19/2019 CT C/A/P ([id number]) which revealed  "1. Enlarged mediastinal, bilateral hilar and lower cervical lymph nodes. Primary considerations include lymphoma, sarcoid and less likely metastatic disease. Normal spleen. No evidence of enlarged lymph nodes within the abdomen or pelvis. 2. Low lying IUD within the LOWER uterine segment and may extend to the cervix."  - 05/17/2019 CT Soft Tissue Neck w/contrast ([id number]) which revealed "Bulky right greater than left cervical and mediastinal lymphadenopathy concerning for lymphoma."  . CBC Latest Ref Rng & Units 07/22/2019 07/09/2019 07/02/2019  WBC 4.0 - 10.5 K/uL 3.0(L) 3.7(L) 5.6  Hemoglobin 12.0 - 15.0 g/dL 11.9(L) 11.3(L) 11.0(L)  Hematocrit 36.0 - 46.0 % 37.0 36.3 35.6(L)  Platelets 150 - 400 K/uL 280 265 357    . CMP Latest Ref Rng & Units 07/22/2019 07/09/2019 07/02/2019  Glucose 70 - 99 mg/dL 99 100(H) 95  BUN 6 - 20 mg/dL 7 10 10   Creatinine 0.44 - 1.00 mg/dL 0.72 0.69 0.69  Sodium 135 - 145 mmol/L 141 140 138  Potassium 3.5 - 5.1 mmol/L 3.6 3.5 3.8  Chloride 98 - 111 mmol/L 106 106 104  CO2 22 - 32 mmol/L 26 25 26   Calcium 8.9 - 10.3 mg/dL 9.0 8.8(L) 8.7(L)  Total Protein 6.5 - 8.1 g/dL 6.7 6.7 7.2  Total Bilirubin 0.3 - 1.2 mg/dL 0.3 0.4  0.4  Alkaline Phos 38 -  126 U/L 52 64 76  AST 15 - 41 U/L 9(L) 10(L) 10(L)  ALT 0 - 44 U/L 14 14 18    . Lab Results  Component Value Date   IRON 79 07/09/2019   TIBC 221 (L) 07/09/2019   IRONPCTSAT 36 07/09/2019   (Iron and TIBC)  Lab Results  Component Value Date   FERRITIN 590 (H) 07/09/2019    05/21/2019 [id number]) Lymph Node Biopsy    RADIOGRAPHIC STUDIES: I have personally reviewed the radiological images as listed and agreed with the findings in the report. No results found.  ASSESSMENT & PLAN:   1) Newly diagnosed Classical Hodgkins lymphoma Likely stage IIIB based on imaging thus far. 2) Microcytic Anemia due to anemia of chronic disease and Iron deficiency- hg improved to 11.3 3) Jehovas Witness   PLAN: A&P: -Discussed pt labwork today, 08/05/19;  CBC w/diff and CMP is as follows: all values are WNL except for AST at 11, RDW at 24.3, Abs Immature Granulocytes at 0.43. -Discussed that a cycle is 2 doses, 15 days apart -Discussed repeat PET before 3rd cycle -Suggested silicon dressing for port tight incision to help with the friction from cloths and irritation to keloid -Discussed staying safe during pandemic and staying active.  FOLLOW UP: -PET/CT in 7-10 days -Please schedule C3 of treatment with labs , portflush with each lab. MD visit with C3D1   The total time spent in the appt was 25 minutes and more than 50% was on counseling and direct patient cares.  All of the patient's questions were answered with apparent satisfaction. The patient knows to call the clinic with any problems, questions or concerns.     Sullivan Lone MD MS AAHIVMS [employer] [facility] Hematology/Oncology Physician [facility]  (Office):       [phone] (Work cell):  [phone] (Fax):           [phone]  08/05/2019 5:10 AM  I, Scot Dock, am acting as a scribe for Dr. Sullivan Lone.   .I have reviewed the above documentation for accuracy and completeness, and I  agree with the above. Brunetta Genera MD

## 2019-08-05 NOTE — Telephone Encounter (Signed)
Per 11/19 los C3 of treatment with labs already scheduled.

## 2019-08-05 NOTE — Patient Instructions (Signed)
[facility] Discharge Instructions for Patients Receiving Chemotherapy  Today you received the following chemotherapy agents Adriamycin, Vinblastine, Bleomycin and DTIC  To help prevent nausea and vomiting after your treatment, we encourage you to take your nausea medication as directed.    If you develop nausea and vomiting that is not controlled by your nausea medication, call the clinic.   BELOW ARE SYMPTOMS THAT SHOULD BE REPORTED IMMEDIATELY:  *FEVER GREATER THAN 100.5 F  *CHILLS WITH OR WITHOUT FEVER  NAUSEA AND VOMITING THAT IS NOT CONTROLLED WITH YOUR NAUSEA MEDICATION  *UNUSUAL SHORTNESS OF BREATH  *UNUSUAL BRUISING OR BLEEDING  TENDERNESS IN MOUTH AND THROAT WITH OR WITHOUT PRESENCE OF ULCERS  *URINARY PROBLEMS  *BOWEL PROBLEMS  UNUSUAL RASH Items with * indicate a potential emergency and should be followed up as soon as possible.  Feel free to call the clinic should you have any questions or concerns. The clinic phone number is [phone].  Please show the CHEMO ALERT CARD at check-in to the Emergency Department and triage nurse.   

## 2019-08-13 ENCOUNTER — Ambulatory Visit (HOSPITAL_COMMUNITY)
Admission: RE | Admit: 2019-08-13 | Discharge: 2019-08-13 | Disposition: A | Discharge status: Home / Self Care | Payer: No Typology Code available for payment source | Source: Ambulatory Visit | Attending: Hematology | Admitting: Hematology

## 2019-08-13 ENCOUNTER — Other Ambulatory Visit: Payer: Self-pay

## 2019-08-13 DIAGNOSIS — Z5111 Encounter for antineoplastic chemotherapy: Secondary | ICD-10-CM

## 2019-08-13 DIAGNOSIS — C8118 Nodular sclerosis classical Hodgkin lymphoma, lymph nodes of multiple sites: Secondary | ICD-10-CM | POA: Diagnosis present

## 2019-08-13 LAB — GLUCOSE, CAPILLARY: Glucose-Capillary: 95 mg/dL (ref 70–99)

## 2019-08-13 MED ORDER — FLUDEOXYGLUCOSE F - 18 (FDG) INJECTION: 9.7000 | Freq: Once | INTRAVENOUS | Status: DC | PRN

## 2019-08-20 ENCOUNTER — Inpatient Hospital Stay: Payer: No Typology Code available for payment source

## 2019-08-20 ENCOUNTER — Other Ambulatory Visit: Payer: Self-pay

## 2019-08-20 ENCOUNTER — Inpatient Hospital Stay (HOSPITAL_BASED_OUTPATIENT_CLINIC_OR_DEPARTMENT_OTHER): Payer: No Typology Code available for payment source | Admitting: Hematology

## 2019-08-20 ENCOUNTER — Inpatient Hospital Stay: Payer: No Typology Code available for payment source | Attending: Hematology

## 2019-08-20 VITALS — BP 126/78 | HR 98 | Temp 98.3°F | Resp 16 | Ht 64.0 in | Wt 202.0 lb

## 2019-08-20 DIAGNOSIS — D638 Anemia in other chronic diseases classified elsewhere: Secondary | ICD-10-CM | POA: Diagnosis not present

## 2019-08-20 DIAGNOSIS — Z95828 Presence of other vascular implants and grafts: Secondary | ICD-10-CM

## 2019-08-20 DIAGNOSIS — Z5111 Encounter for antineoplastic chemotherapy: Secondary | ICD-10-CM | POA: Diagnosis not present

## 2019-08-20 DIAGNOSIS — G629 Polyneuropathy, unspecified: Secondary | ICD-10-CM | POA: Insufficient documentation

## 2019-08-20 DIAGNOSIS — C8111 Nodular sclerosis classical Hodgkin lymphoma, lymph nodes of head, face, and neck: Secondary | ICD-10-CM | POA: Insufficient documentation

## 2019-08-20 DIAGNOSIS — Z79899 Other long term (current) drug therapy: Secondary | ICD-10-CM | POA: Insufficient documentation

## 2019-08-20 DIAGNOSIS — D709 Neutropenia, unspecified: Secondary | ICD-10-CM

## 2019-08-20 DIAGNOSIS — Z7189 Other specified counseling: Secondary | ICD-10-CM

## 2019-08-20 DIAGNOSIS — D500 Iron deficiency anemia secondary to blood loss (chronic): Secondary | ICD-10-CM

## 2019-08-20 DIAGNOSIS — Z7952 Long term (current) use of systemic steroids: Secondary | ICD-10-CM | POA: Diagnosis not present

## 2019-08-20 DIAGNOSIS — L02411 Cutaneous abscess of right axilla: Secondary | ICD-10-CM | POA: Insufficient documentation

## 2019-08-20 DIAGNOSIS — C8118 Nodular sclerosis classical Hodgkin lymphoma, lymph nodes of multiple sites: Secondary | ICD-10-CM

## 2019-08-20 LAB — CBC WITH DIFFERENTIAL/PLATELET
Abs Immature Granulocytes: 0.01 10*3/uL (ref 0.00–0.07)
Basophils Absolute: 0 10*3/uL (ref 0.0–0.1)
Basophils Relative: 1 %
Eosinophils Absolute: 0.2 10*3/uL (ref 0.0–0.5)
Eosinophils Relative: 4 %
HCT: 38.4 % (ref 36.0–46.0)
Hemoglobin: 12.6 g/dL (ref 12.0–15.0)
Immature Granulocytes: 0 %
Lymphocytes Relative: 45 %
Lymphs Abs: 1.9 10*3/uL (ref 0.7–4.0)
MCH: 29.5 pg (ref 26.0–34.0)
MCHC: 32.8 g/dL (ref 30.0–36.0)
MCV: 89.9 fL (ref 80.0–100.0)
Monocytes Absolute: 0.8 10*3/uL (ref 0.1–1.0)
Monocytes Relative: 19 %
Neutro Abs: 1.3 10*3/uL — ABNORMAL LOW (ref 1.7–7.7)
Neutrophils Relative %: 31 %
Platelets: 261 10*3/uL (ref 150–400)
RBC: 4.27 MIL/uL (ref 3.87–5.11)
RDW: 21.6 % — ABNORMAL HIGH (ref 11.5–15.5)
WBC: 4.2 10*3/uL (ref 4.0–10.5)
nRBC: 0 % (ref 0.0–0.2)

## 2019-08-20 LAB — CMP (CANCER CENTER ONLY)
ALT: 20 U/L (ref 0–44)
AST: 16 U/L (ref 15–41)
Albumin: 3.7 g/dL (ref 3.5–5.0)
Alkaline Phosphatase: 56 U/L (ref 38–126)
Anion gap: 8 (ref 5–15)
BUN: 7 mg/dL (ref 6–20)
CO2: 28 mmol/L (ref 22–32)
Calcium: 9.1 mg/dL (ref 8.9–10.3)
Chloride: 107 mmol/L (ref 98–111)
Creatinine: 0.69 mg/dL (ref 0.44–1.00)
GFR, Est AFR Am: >60 mL/min (ref >60)
GFR, Estimated: >60 mL/min (ref >60)
Glucose, Bld: 88 mg/dL (ref 70–99)
Potassium: 3.5 mmol/L (ref 3.5–5.1)
Sodium: 143 mmol/L (ref 135–145)
Total Bilirubin: 0.5 mg/dL (ref 0.3–1.2)
Total Protein: 6.7 g/dL (ref 6.5–8.1)

## 2019-08-20 MED ORDER — SODIUM CHLORIDE 0.9 % IV SOLN
Freq: Once | INTRAVENOUS | Status: AC
  Administered 2019-08-20: 14:00:00 via INTRAVENOUS
  Filled 2019-08-20: qty 250

## 2019-08-20 MED ORDER — SODIUM CHLORIDE 0.9 % IV SOLN
Freq: Once | INTRAVENOUS | Status: AC
  Administered 2019-08-20: 14:00:00 via INTRAVENOUS
  Filled 2019-08-20: qty 5

## 2019-08-20 MED ORDER — DOXORUBICIN HCL CHEMO IV INJECTION 2 MG/ML
25.0000 mg/m2 | Freq: Once | INTRAVENOUS | Status: AC
  Administered 2019-08-20: 50 mg via INTRAVENOUS
  Filled 2019-08-20: qty 25

## 2019-08-20 MED ORDER — HEPARIN SOD (PORK) LOCK FLUSH 100 UNIT/ML IV SOLN
500.0000 [IU] | Freq: Once | INTRAVENOUS | Status: AC | PRN
  Administered 2019-08-20: 500 [IU]
  Filled 2019-08-20: qty 5

## 2019-08-20 MED ORDER — SODIUM CHLORIDE 0.9% FLUSH
10.0000 mL | Freq: Once | INTRAVENOUS | Status: AC
  Administered 2019-08-20: 10 mL
  Filled 2019-08-20: qty 10

## 2019-08-20 MED ORDER — LORAZEPAM 2 MG/ML IJ SOLN
INTRAMUSCULAR | Status: AC
  Filled 2019-08-20: qty 1

## 2019-08-20 MED ORDER — PALONOSETRON HCL INJECTION 0.25 MG/5ML
INTRAVENOUS | Status: AC
  Filled 2019-08-20: qty 5

## 2019-08-20 MED ORDER — LORAZEPAM 2 MG/ML IJ SOLN
0.5000 mg | Freq: Once | INTRAMUSCULAR | Status: AC
  Administered 2019-08-20: 0.5 mg via INTRAVENOUS

## 2019-08-20 MED ORDER — PALONOSETRON HCL INJECTION 0.25 MG/5ML
0.2500 mg | Freq: Once | INTRAVENOUS | Status: AC
  Administered 2019-08-20: 0.25 mg via INTRAVENOUS

## 2019-08-20 MED ORDER — VINBLASTINE SULFATE CHEMO INJECTION 1 MG/ML
6.0000 mg/m2 | Freq: Once | INTRAVENOUS | Status: AC
  Administered 2019-08-20: 12 mg via INTRAVENOUS
  Filled 2019-08-20: qty 12

## 2019-08-20 MED ORDER — SODIUM CHLORIDE 0.9% FLUSH
10.0000 mL | INTRAVENOUS | Status: DC | PRN
  Administered 2019-08-20: 10 mL
  Filled 2019-08-20: qty 10

## 2019-08-20 MED ORDER — SODIUM CHLORIDE 0.9 % IV SOLN
375.0000 mg/m2 | Freq: Once | INTRAVENOUS | Status: AC
  Administered 2019-08-20: 750 mg via INTRAVENOUS
  Filled 2019-08-20: qty 75

## 2019-08-20 NOTE — Progress Notes (Signed)
Per Dr. Irene Limbo, ok to treat today with ANC 1.3

## 2019-08-20 NOTE — Patient Instructions (Signed)
[facility] Discharge Instructions for Patients Receiving Chemotherapy  Today you received the following chemotherapy agents: Doxorubicin, Vinblastine, Dacarbazine  To help prevent nausea and vomiting after your treatment, we encourage you to take your nausea medication as directed by your MD.   If you develop nausea and vomiting that is not controlled by your nausea medication, call the clinic.   BELOW ARE SYMPTOMS THAT SHOULD BE REPORTED IMMEDIATELY:  *FEVER GREATER THAN 100.5 F  *CHILLS WITH OR WITHOUT FEVER  NAUSEA AND VOMITING THAT IS NOT CONTROLLED WITH YOUR NAUSEA MEDICATION  *UNUSUAL SHORTNESS OF BREATH  *UNUSUAL BRUISING OR BLEEDING  TENDERNESS IN MOUTH AND THROAT WITH OR WITHOUT PRESENCE OF ULCERS  *URINARY PROBLEMS  *BOWEL PROBLEMS  UNUSUAL RASH Items with * indicate a potential emergency and should be followed up as soon as possible.  Feel free to call the clinic should you have any questions or concerns. The clinic phone number is [phone].  Please show the [facility] at check-in to the Emergency Department and triage nurse. Coronavirus (COVID-19) Are you at risk?  Are you at risk for the Coronavirus (COVID-19)?  To be considered HIGH RISK for Coronavirus (COVID-19), you have to meet the following criteria:  . Traveled to [place], [place], [place], [place] or [place]; or in the [place] to [place], [place], [place], or [place]; and have fever, cough, and shortness of breath within the last 2 weeks of travel OR . Been in close contact with a person diagnosed with COVID-19 within the last 2 weeks and have fever, cough, and shortness of breath . IF YOU DO NOT MEET THESE CRITERIA, YOU ARE CONSIDERED LOW RISK FOR COVID-19.  What to do if you are HIGH RISK for COVID-19?  Marland Kitchen If you are having a medical emergency, call 911. . Seek medical care right away. Before you go to a doctor's office, urgent care or emergency department,  call ahead and tell them about your recent travel, contact with someone diagnosed with COVID-19, and your symptoms. You should receive instructions from your physician's office regarding next steps of care.  . When you arrive at healthcare provider, tell the healthcare staff immediately you have returned from visiting [place], [place], [place], [place] or [place]; or traveled in the [place] to [place], [place], [place], or [place]; in the last two weeks or you have been in close contact with a person diagnosed with COVID-19 in the last 2 weeks.   . Tell the health care staff about your symptoms: fever, cough and shortness of breath. . After you have been seen by a medical provider, you will be either: o Tested for (COVID-19) and discharged home on quarantine except to seek medical care if symptoms worsen, and asked to  - Stay home and avoid contact with others until you get your results (4-5 days)  - Avoid travel on public transportation if possible (such as bus, train, or airplane) or o Sent to the Emergency Department by EMS for evaluation, COVID-19 testing, and possible admission depending on your condition and test results.  What to do if you are LOW RISK for COVID-19?  Reduce your risk of any infection by using the same precautions used for avoiding the common cold or flu:  Marland Kitchen Wash your hands often with soap and warm water for at least 20 seconds.  If soap and water are not readily available, use an alcohol-based hand sanitizer with at least 60% alcohol.  Marland Kitchen  If coughing or sneezing, cover your mouth and nose by coughing or sneezing into the elbow areas of your shirt or coat, into a tissue or into your sleeve (not your hands). . Avoid shaking hands with others and consider head nods or verbal greetings only. . Avoid touching your eyes, nose, or mouth with unwashed hands.  . Avoid close contact with people who are sick. . Avoid places or events with large numbers of people in one  location, like concerts or sporting events. . Carefully consider travel plans you have or are making. . If you are planning any travel outside or inside the [place], visit the CDC's Travelers' Health webpage for the latest health notices. . If you have some symptoms but not all symptoms, continue to monitor at home and seek medical attention if your symptoms worsen. . If you are having a medical emergency, call 911.   [place] / e-Visit: [email or web address]         [facility]: [facility]: [phone]                   [facility]: [phone]

## 2019-08-20 NOTE — Progress Notes (Signed)
HEMATOLOGY/ONCOLOGY CLINIC NOTE  Date of Service: 08/20/19   Patient Care Team: [name], MD as PCP - General (Family Medicine)  CHIEF COMPLAINTS/PURPOSE OF CONSULTATION:   Continue management of classical hodgkins lymphoma   CURRENT TREATMENT: Cycle 3 day 1  HISTORY OF PRESENTING ILLNESS:   Elizabeth Beasley is a wonderful 38 y.o. female who has been referred to [place] by Dr Madilyn Fireman for evaluation and management of microcytic anemia/cervical lymphadenopathy.The pt reports that she has a history of iron deficiency but has never had a transfusion. Pt has had two pregnancies and has two children, ages 63 and 33. There was a concern for anemia with her second pregnancy so she was given an erythropoietin shot. She has had a copper IUD for about 2 years and her menstrual cycle has been slightly longer since getting the IUD. She has been taking 28 mg TID PO iron since June. Pt has no known medication allergies and has had a previous removal of Bartholin's gland cyst. Pt has had no known contact with chemicals either through work or hobbies. She has no history of asthma or heavy smoke/aerosol exposure.   Pt did not notice any cervical lymphadenopathy in June. In late July, early August she began to experience extreme fatigue and could begin to feel small lumps in her neck. Her fatigue is improved with taking her PO Iron TID. She has experienced a couple of episodes of night sweats, with the latest occurring in late August. Pt has also had <10 lbs loss in 2 months. She has not had any rashes but is experiencing itching in her palms, broader itching after hot showers.   Pt is currently working from home. She has had her seasonal flu shot. Pt is not planning on having more children and is not concerned about fertility. Pt's husband would like to take a drive to the [facility] next month. Pt would not want any blood product transfusions, as she is a Jehovah's Witness. She is  comfortable with IV iron infusions.   INTERVAL HISTORY:  Elizabeth Beasley is a 38 y.o. female here for evaluation and management of hodgkins lymphoma. The patient's last visit with [place] was on 08/05/2019. The pt reports that she is doing well overall.  The pt reports doing okay she had some body aches.   Of note since the patient's last visit, pt has had NM PET Image Restag (PS) Skull Base To Thigh (Accession [id number]) completed on 08/13/2019 with results revealing "1. Reduction size and metabolic activity of supraclavicular lymph nodes to less than blood pool activity ( Deauville 2) 2. Reduction in size and metabolic activity bulky mediastinal adenopathy. Activity of residual enlarged lymph nodes is greater than blood pool but less than liver activity ( Deauville 3) 3. Resolution gastrohepatic adenopathy Deauville 1 4. Normal spleen and bone marrow.5. Small focus of intense activity associated with cutaneous nodule anterior to the RIGHT axilla is indeterminate. Favor cutaneous infection over lymphoma. Recommend physical exam."  Lab results today (08/20/19) of CBC w/diff and CMP is as follows: all values are WNL except for RDW at 21.6, Neutro Abs at 1.3.  On review of systems, pt reports body aches and denies abdominal pains, back pain, mouth sores, leg swelling and any other symptoms.   MEDICAL HISTORY:  Past Medical History:  Diagnosis Date  . Anemia   . Bartholin gland cyst   . Hodgkin's lymphoma, nodular sclerosis ([place]) 06/08/2019  . Hx of seasonal allergies   .  Lymphadenopathy, cervical 05/21/2019  . PONV (postoperative nausea and vomiting)   . 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 CESAREAN SECTION  2010  . MASS EXCISION Right 05/21/2019   Procedure: EXCISION OF RIGHT DEEP NECK LYMPH NODE;  Surgeon: Fanny Skates, MD;  Location: [facility];  Service: General;  Laterality: Right;  .  PORTACATH PLACEMENT Right 06/08/2019   Procedure: INSERTION PORT-A-CATH WITH ULTRASOUND GUIDANCE;  Surgeon: Fanny Skates, MD;  Location: [facility];  Service: General;  Laterality: Right;  . URETHRAL CYST REMOVAL N/A 08/30/2016   Procedure: BARTHOLIN'S GLAND CYST EXCISION;  Surgeon: Ardis Hughs, MD;  Location: [facility];  Service: Urology;  Laterality: N/A;  . v back     2012    SOCIAL HISTORY: Social History   Socioeconomic History  . Marital status: Married    Spouse name: Montine Circle  . Number of children: 2  . Years of education: Assoc  . Highest education level: Not on file  Occupational History  . Occupation: CMA    Employer: [employer]  . Financial resource strain: Not on file  . Food insecurity    Worry: Not on file    Inability: Not on file  . Transportation needs    Medical: Not on file    Non-medical: Not on file  Tobacco Use  . Smoking status: Never Smoker  . Smokeless tobacco: Never Used  Substance and Sexual Activity  . Alcohol use: Not Currently    Comment: ocassional  . Drug use: No  . Sexual activity: Yes    Partners: Male    Birth control/protection: I.U.D.  Lifestyle  . Physical activity    Days per week: Not on file    Minutes per session: Not on file  . Stress: Not on file  Relationships  . Social Herbalist on phone: Not on file    Gets together: Not on file    Attends religious service: Not on file    Active member of club or organization: Not on file    Attends meetings of clubs or organizations: Not on file    Relationship status: Not on file  . Intimate partner violence    Fear of current or ex partner: Not on file    Emotionally abused: Not on file    Physically abused: Not on file    Forced sexual activity: Not on file  Other Topics Concern  . Not on file  Social History Narrative   Regular exercise. No regular caffeine.    FAMILY HISTORY: Family History  Problem  Relation Age of Onset  . Colon cancer Father 24  . Colon polyps Mother   . Hypertension Mother   . Diabetes Paternal Grandmother   . Stroke Paternal Grandmother   . Alcohol abuse Paternal Uncle   . Colon cancer Paternal Aunt 61  . Esophageal cancer Neg Hx   . Rectal cancer Neg Hx   . Stomach cancer Neg Hx    On PMHx the pt reports C-section, Bartholin's Gland Cyst removal. On Social Hx the pt reports non smoker, no alcohol use. On Family Hx the pt reports father passed from colon cancer, mother has colon polyps.  ALLERGIES:  has No Known Allergies.  MEDICATIONS:  Current Outpatient Medications  Medication Sig Dispense Refill  . cetirizine (ZYRTEC) 5 MG tablet Take 5  mg by mouth daily as needed for allergies.    Marland Kitchen dexamethasone (DECADRON) 4 MG tablet Take 2 tablets by mouth once a day on the day after chemotherapy and then take 2 tablets two times a day for 2 days. Take with food. (Patient not taking: Reported on 06/15/2019) 30 tablet 1  . LORazepam (ATIVAN) 0.5 MG tablet Take 1 tablet (0.5 mg total) by mouth every 6 (six) hours as needed (Nausea or vomiting). (Patient not taking: Reported on 06/15/2019) 30 tablet 0  . ondansetron (ZOFRAN) 8 MG tablet Take 1 tablet (8 mg total) by mouth 2 (two) times daily as needed. Start on the third day after chemotherapy. (Patient not taking: Reported on 06/15/2019) 30 tablet 1  . potassium chloride SA (KLOR-CON) 20 MEQ tablet Take 1 tablet (20 mEq total) by mouth 2 (two) times daily. 60 tablet 1  . prochlorperazine (COMPAZINE) 10 MG tablet Take 1 tablet (10 mg total) by mouth every 6 (six) hours as needed (Nausea or vomiting). (Patient not taking: Reported on 06/15/2019) 30 tablet 1   No current facility-administered medications for this visit.    Facility-Administered Medications Ordered in Other Visits  Medication Dose Route Frequency Provider Last Rate Last Dose  . sodium chloride flush (NS) 0.9 % injection 10 mL  10 mL Intracatheter PRN Brunetta Genera, MD   10 mL at 08/05/19 1658    REVIEW OF SYSTEMS:   A 10+ POINT REVIEW OF SYSTEMS WAS OBTAINED including neurology, dermatology, psychiatry, cardiac, respiratory, lymph, extremities, GI, GU, Musculoskeletal, constitutional, breasts, reproductive, HEENT.  All pertinent positives are noted in the HPI.  All others are negative.     PHYSICAL EXAMINATION: ECOG FS:1 - Symptomatic but completely ambulatory  Vitals:   08/20/19 1143  BP: 126/78  Pulse: 98  Resp: 16  Temp: 98.3 F (36.8 C)  SpO2: 100%   Wt Readings from Last 3 Encounters:  08/20/19 202 lb (91.6 kg)  08/05/19 201 lb 4.8 oz (91.3 kg)  07/22/19 197 lb 14.4 oz (89.8 kg)   Body mass index is 34.67 kg/m.    GENERAL:alert, in no acute distress and comfortable SKIN: no acute rashes, no significant lesions EYES: conjunctiva are pink and non-injected, sclera anicteric OROPHARYNX: MMM, no exudates, no oropharyngeal erythema or ulceration NECK: supple, no JVD LYMPH:  no palpable lymphadenopathy in the cervical, axillary or inguinal regions LUNGS: clear to auscultation b/l with normal respiratory effort HEART: regular rate & rhythm ABDOMEN:  normoactive bowel sounds , non tender, not distended. Extremity: no pedal edema PSYCH: alert & oriented x 3 with fluent speech NEURO: no focal motor/sensory deficits     LABORATORY DATA:  I have reviewed the data as listed  - 05/14/2019 Fe+TIBC+Fer is as follows: Iron at 11, TIBC at 258, % Sat at 4, Ferritin at 50. -Discussed 05/21/2019 Lymph Node Bx [id number]) which revealed " CLASSIC HODGKIN LYMPHOMA."  - 05/19/2019 CT C/A/P ([id number]) which revealed  "1. Enlarged mediastinal, bilateral hilar and lower cervical lymph nodes. Primary considerations include lymphoma, sarcoid and less likely metastatic disease. Normal spleen. No evidence of enlarged lymph nodes within the abdomen or pelvis. 2. Low lying IUD within the LOWER uterine segment and may extend to the  cervix."  - 05/17/2019 CT Soft Tissue Neck w/contrast ([id number]) which revealed "Bulky right greater than left cervical and mediastinal lymphadenopathy concerning for lymphoma."  . CBC Latest Ref Rng & Units 08/20/2019 08/05/2019 07/22/2019  WBC 4.0 - 10.5 K/uL 4.2 7.9 3.0(L)  Hemoglobin 12.0 -  15.0 g/dL 12.6 12.1 11.9(L)  Hematocrit 36.0 - 46.0 % 38.4 37.2 37.0  Platelets 150 - 400 K/uL 261 226 280    . CMP Latest Ref Rng & Units 08/20/2019 08/05/2019 07/22/2019  Glucose 70 - 99 mg/dL 88 96 99  BUN 6 - 20 mg/dL 7 9 7   Creatinine 0.44 - 1.00 mg/dL 0.69 0.70 0.72  Sodium 135 - 145 mmol/L 143 141 141  Potassium 3.5 - 5.1 mmol/L 3.5 3.7 3.6  Chloride 98 - 111 mmol/L 107 105 106  CO2 22 - 32 mmol/L 28 25 26   Calcium 8.9 - 10.3 mg/dL 9.1 8.9 9.0  Total Protein 6.5 - 8.1 g/dL 6.7 6.6 6.7  Total Bilirubin 0.3 - 1.2 mg/dL 0.5 0.3 0.3  Alkaline Phos 38 - 126 U/L 56 57 52  AST 15 - 41 U/L 16 11(L) 9(L)  ALT 0 - 44 U/L 20 17 14    . Lab Results  Component Value Date   IRON 79 07/09/2019   TIBC 221 (L) 07/09/2019   IRONPCTSAT 36 07/09/2019   (Iron and TIBC)  Lab Results  Component Value Date   FERRITIN 590 (H) 07/09/2019  08/13/2019 NM PET Image Restag (PS) Skull Base To Thigh (Accession [id number])   05/21/2019 [id number]) Lymph Node Biopsy    RADIOGRAPHIC STUDIES: I have personally reviewed the radiological images as listed and agreed with the findings in the report. Nm Pet Image Restag (ps) Skull Base To Thigh  Result Date: 08/13/2019 CLINICAL DATA:  Subsequent treatment strategy for Hodgkin's lymphoma. EXAM: NUCLEAR MEDICINE PET SKULL BASE TO THIGH TECHNIQUE: 9.7 mCi F-18 FDG was injected intravenously. Full-ring PET imaging was performed from the skull base to thigh after the radiotracer. CT data was obtained and used for attenuation correction and anatomic localization. Fasting blood glucose: 95 mg/dl COMPARISON:  None. FINDINGS: Mediastinal blood pool activity: SUV max 1.97  Liver activity: SUV max 3.22 NECK: Supraclavicular lymph nodes are reduced significantly in metabolic activity. Example RIGHT supraclavicular node with SUV max equal 1.9 decreased from 12.5. Additionally lymph node is decreased in size measuring 10 mm short axis compared to 20 mm. Incidental CT findings: none CHEST: Marked reduction in metabolic activity of mediastinal lymph nodes. For example RIGHT lower paratracheal lymph node with SUV max equal 2.7 compared SUV max equal 10.7. Lymph node reduced in size measuring 13 mm compared to 20 mm. Large RIGHT lower paratracheal lymph node measures 23 mm compared with 35 mm with SUV max equal 2.9 decreased from SUV max equal 10.9. All of the mediastinal lymph nodes have metabolic activity less than liver activity. Resolution of LEFT hypermetabolic axillary adenopathy Single focus of intense metabolic activity associated with the skin anterior to the RIGHT axilla. This activity is intense with SUV max equal 7.9. There is subtle focus of nodularity in the skin at this level measuring 9 mm (image 40/4). Incidental CT findings: No suspicious pulmonary nodules. Port in the anterior chest wall with tip in distal SVC. ABDOMEN/PELVIS: Reduction in size and metabolic activity of previously hypermetabolic gastrohepatic ligament lymph nodes. No hypermetabolic abdominopelvic lymph nodes present. Spleen is normal size and normal metabolic activity. No evidence of liver metastasis.  Pancreas normal. Incidental CT findings: IUD within the uterus. SKELETON: No focal hypermetabolic activity to suggest skeletal metastasis. Incidental CT findings: none IMPRESSION: 1. Reduction size and metabolic activity of supraclavicular lymph nodes to less than blood pool activity ( Deauville 2) 2. Reduction in size and metabolic activity bulky mediastinal adenopathy. Activity of residual enlarged  lymph nodes is greater than blood pool but less than liver activity ( Deauville 3) 3. Resolution gastrohepatic  adenopathy Deauville 1 4. Normal spleen and bone marrow. 5. Small focus of intense activity associated with cutaneous nodule anterior to the RIGHT axilla is indeterminate. Favor cutaneous infection over lymphoma. Recommend physical exam. Electronically Signed   By: Suzy Bouchard M.D.   On: 08/13/2019 09:16    ASSESSMENT & PLAN:   1) Recently diagnosed Classical Hodgkins lymphoma Likely stage IIIB based on imaging thus far. 2) Microcytic Anemia due to anemia of chronic disease and Iron deficiency- hg improved to 11.3 3) Jehovas Witness   PLAN: -Discussed pt labwork today, 08/20/19; all values are WNL except for RDW at 21.6, Neutro Abs at 1.3. -Discussed NM PET Image Restag (PS) Skull Base To Thigh (Accession [id number]) completed on 08/13/2019 with results revealing "1. Reduction size and metabolic activity of supraclavicular lymph nodes to less than blood pool activity ( Deauville 2) 2. Reduction in size and metabolic activity bulky mediastinal adenopathy. Activity of residual enlarged lymph nodes is greater than blood pool but less than liver activity ( Deauville 3) 3. Resolution gastrohepatic adenopathy Deauville 1 4. Normal spleen and bone marrow.5. Small focus of intense activity associated with cutaneous nodule anterior to the RIGHT axilla is indeterminate. Favor cutaneous infection over lymphoma. Recommend physical exam." -Discussed dropping Bleomycin and doing AVD for four more cycles -will hold Granix for now and monitor for worsening neutropenia. -Discussed using topical antibiotics and warm moist compressions for the ingrown hair in armpit. Advised to contact office it becomes enlarged or bothersome. -Discussed limiting exposure to COVID-19 -Discussed to continue taking vitamin D   FOLLOW UP: F/u as per next 2 scheduled treatment with portflush and labs MD visit with next treatment in 2 weeks  The total time spent in the appt was 25 minutes and more than 50% was on counseling and  direct patient cares.  All of the patient's questions were answered with apparent satisfaction. The patient knows to call the clinic with any problems, questions or concerns.   Sullivan Lone MD MS AAHIVMS [facility] Hematology/Oncology Physician [facility]  (Office):       [phone] (Work cell):  [phone] (Fax):           [phone]  08/20/2019 7:21 AM  I, Scot Dock, am acting as a scribe for Dr. Sullivan Lone.   .I have reviewed the above documentation for accuracy and completeness, and I agree with the above. Brunetta Genera MD

## 2019-08-23 ENCOUNTER — Telehealth: Payer: Self-pay | Admitting: Hematology

## 2019-08-23 ENCOUNTER — Telehealth: Payer: Self-pay

## 2019-08-23 NOTE — Telephone Encounter (Signed)
Follow up call to Pt after call to after hour nurse on 08/20/19  about what medication to take for nausea Asked Pt if nausea subsided and Pt. Stated she had taken one of her ginger candies and it worked. Pt has 3 nausea medications prescribed zofran, compazine, and ativan. Reinforced to Pt that she gets emend with her treatment so 3 day after chemo she can take the zofran. But if she feels nauseous to start with the compazine and if that doesn't work she can take the ativan on days after chemo. Pt verbalized understanding stated she had read that on the bottle but wasn't sure. Stated if she should have any other problems or concerns to give a return call to [facility].

## 2019-08-23 NOTE — Telephone Encounter (Signed)
Per 12/4 los F/u as per next 2 scheduled treatment with portflush and labs MD visit with next treatment in 2 weeks

## 2019-08-26 ENCOUNTER — Other Ambulatory Visit: Payer: Self-pay | Admitting: Hematology

## 2019-08-26 ENCOUNTER — Telehealth: Payer: Self-pay | Admitting: *Deleted

## 2019-08-26 MED ORDER — DOXYCYCLINE HYCLATE 100 MG PO TABS: 100.0000 mg | ORAL_TABLET | Freq: Two times a day (BID) | ORAL | 0 refills | Status: AC

## 2019-08-26 MED FILL — DOXYCYCLINE HYCLATE 100 MG: 100 | 5 days supply | Qty: 10 | Fill #0 | Status: TO

## 2019-08-26 MED FILL — DOXYCYCLINE HYCLATE 100 MG: 100 | 5 days supply | Qty: 10 | Fill #0

## 2019-08-26 NOTE — Telephone Encounter (Signed)
Hair bump under arm has increased in size and pain since she saw you last Friday. Not red or hot. Using warm compresses and antibiotic ointment as directed. She states MD asked her to call with any changes. Dr. Irene Limbo informed.  Per Dr. Irene Limbo - will cover with oral doxycycline for possible evolving abscess to be safe given she is on active chemotherapy with some neutropenia.-- prescription sent to her phamracy Contacted patient with this information. She verbalized understanding.

## 2019-08-26 NOTE — Progress Notes (Unsigned)
Doxycycline prescription for infected carbuncle in axilla.

## 2019-08-30 ENCOUNTER — Inpatient Hospital Stay (HOSPITAL_BASED_OUTPATIENT_CLINIC_OR_DEPARTMENT_OTHER): Payer: No Typology Code available for payment source | Admitting: Medical

## 2019-08-30 ENCOUNTER — Other Ambulatory Visit: Payer: Self-pay

## 2019-08-30 VITALS — BP 110/80 | HR 120 | Temp 98.0°F | Resp 18 | Ht 64.0 in | Wt 198.5 lb

## 2019-08-30 DIAGNOSIS — L02411 Cutaneous abscess of right axilla: Secondary | ICD-10-CM | POA: Diagnosis not present

## 2019-08-30 DIAGNOSIS — Z5111 Encounter for antineoplastic chemotherapy: Secondary | ICD-10-CM | POA: Diagnosis not present

## 2019-08-30 DIAGNOSIS — C8110 Nodular sclerosis classical Hodgkin lymphoma, unspecified site: Secondary | ICD-10-CM

## 2019-08-30 MED ORDER — FLUCONAZOLE 150 MG PO TABS: ORAL_TABLET | ORAL | 0 refills | Status: DC

## 2019-08-30 MED ORDER — SULFAMETHOXAZOLE-TRIMETHOPRIM 800-160 MG PO TABS: 1.0000 | ORAL_TABLET | Freq: Two times a day (BID) | ORAL | 0 refills | Status: DC

## 2019-08-30 MED FILL — SULFAMETHOXAZOLE-TMP DS TAB: 800-160 | 7 days supply | Qty: 14 | Fill #0

## 2019-08-30 MED FILL — FLUCONAZOLE 150 MG TABLET: 150 | 4 days supply | Qty: 2 | Fill #0

## 2019-08-30 NOTE — Progress Notes (Signed)
[facility] Progress Note   Elizabeth Beasley [id number] [birth year] 38 y.o.  Elizabeth Beasley is managed by Dr. 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 Sullivan Lone and is status post cycle 3 of ABVD with Neulasta support which was given on 08/20/2019.  She is scheduled to return on 09/02/2019.  Developing abscess of the right axilla: The patient was told to stop doxycycline and to begin Bactrim 800-160 mg tablets twice daily.  She was told to use a heating pad our warm moist cloth to the area multiple times daily in hopes of "ripening" the abscess. She was told to have Dr. Irene Limbo look at the area at her upcoming visit.  Please see After Visit Summary for patient specific instructions.  Future Appointments  Date Time Provider [place]  09/02/2019 11:00 AM [facility] ONLY [facility] None  09/02/2019 11:15 AM [facility] [place] FLUSH [facility] None  09/02/2019 11:40 AM Brunetta Genera, MD [facility] None  09/02/2019 12:30 PM [facility] INFUSION [facility] None  09/16/2019 10:30 AM [facility] LAB 3 [facility] None  09/16/2019 10:45 AM [facility] [place] FLUSH [facility] None  09/16/2019 11:30 AM [facility] INFUSION [facility] None    No orders of the defined types were placed in this encounter.      Subjective:   Patient ID:  Elizabeth Beasley is a 38 y.o. (DOB 02-05-1981) female.  Chief Complaint: No chief complaint on file.   HPI Elizabeth Beasley  Is a 38  y.o. female with a diagnosis of a nodular sclerosing Hodgkin's lymphoma. She is managed by Dr. Sullivan Lone and is status post cycle 3 of ABVD with Neulasta support which was given on 08/20/2019.  She is to be seen again on 09/02/2019. Elizabeth Beasley called on 08/26/2019 reporting that she had a "hair bump" under her right arm which had increased in size and pain since her last appointment. She has used warm compresses and antibiotic ointment. She was prescribed doxycycline for a  possible evolving abscess. She presents today with continued swelling and pain despite taking doxycycline. She has had no fevers, chills, or sweats. She denies trauma.  Medications: I have reviewed the patient's current medications.  Allergies: No Known Allergies  Past Medical History:  Diagnosis Date  . Anemia   . Bartholin gland cyst   . Hodgkin's lymphoma, nodular sclerosis ([place]) 06/08/2019  . Hx of seasonal allergies   . Lymphadenopathy, cervical 05/21/2019  . PONV (postoperative nausea and vomiting)   . 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 CESAREAN SECTION  2010  . MASS EXCISION Right 05/21/2019   Procedure: EXCISION OF RIGHT DEEP NECK LYMPH NODE;  Surgeon: Fanny Skates, MD;  Location: [place];  Service: General;  Laterality: Right;  . PORTACATH PLACEMENT Right 06/08/2019   Procedure: INSERTION PORT-A-CATH WITH ULTRASOUND GUIDANCE;  Surgeon: Fanny Skates, MD;  Location: [place];  Service: General;  Laterality: Right;  . URETHRAL CYST REMOVAL N/A 08/30/2016   Procedure: BARTHOLIN'S GLAND CYST EXCISION;  Surgeon:  Ardis Hughs, MD;  Location: [facility];  Service: Urology;  Laterality: N/A;  . v back     2012    Family History  Problem Relation Age of Onset  . Colon cancer Father 53  . Colon polyps Mother   . Hypertension Mother   . Diabetes Paternal Grandmother   . Stroke Paternal  Grandmother   . Alcohol abuse Paternal Uncle   . Colon cancer Paternal Aunt 68  . Esophageal cancer Neg Hx   . Rectal cancer Neg Hx   . Stomach cancer Neg Hx     Social History   Socioeconomic History  . Marital status: Married    Spouse name: Montine Circle  . Number of children: 2  . Years of education: Assoc  . Highest education level: Not on file  Occupational History  . Occupation: CMA    Employer: [employer]  Tobacco Use  . Smoking status: Never Smoker  . Smokeless tobacco: Never Used  Substance and Sexual Activity  . Alcohol use: Not Currently    Comment: ocassional  . Drug use: No  . Sexual activity: Yes    Partners: Male    Birth control/protection: I.U.D.  Other Topics Concern  . Not on file  Social History Narrative   Regular exercise. No regular caffeine.   Social Determinants of Health   Financial Resource Strain:   . Difficulty of Paying Living Expenses: Not on file  Food Insecurity:   . Worried About Charity fundraiser in the Last Year: Not on file  . Ran Out of Food in the Last Year: Not on file  Transportation Needs:   . Lack of Transportation (Medical): Not on file  . Lack of Transportation (Non-Medical): Not on file  Physical Activity:   . Days of Exercise per Week: Not on file  . Minutes of Exercise per Session: Not on file  Stress:   . Feeling of Stress : Not on file  Social Connections:   . Frequency of Communication with Friends and Family: Not on file  . Frequency of Social Gatherings with Friends and Family: Not on file  . Attends Religious Services: Not on file  . Active Member of Clubs or Organizations: Not on file  . Attends Archivist Meetings: Not on file  . Marital Status: Not on file  Intimate Partner Violence:   . Fear of Current or Ex-Partner: Not on file  . Emotionally Abused: Not on file  . Physically Abused: Not on file  . Sexually Abused: Not on file    Past Medical History, Surgical history, Social history, and  Family history were reviewed and updated as appropriate.   Please see review of systems for further details on the patient's review from today.   Review of Systems:  Review of Systems  Constitutional: Negative for chills, diaphoresis and fever.  Skin: Negative for rash and wound.       Swollen, tender, warm right axillary mass.    Objective:   Physical Exam:  BP 110/80 (BP Location: Left Arm, Patient Position: Sitting)   Pulse (!) 120   Temp 98 F (36.7 C) (Oral)   Resp 18   Ht 5\' 4"  (1.626 m)   Wt 198 lb 8 oz (90 kg)   SpO2 100%   BMI 34.07 kg/m  ECOG: 0  Physical Exam Constitutional:      General: She is not in acute distress.    Appearance: Normal appearance. She is not ill-appearing, toxic-appearing  or diaphoretic.  HENT:     Head: Normocephalic and atraumatic.  Skin:    General: Skin is warm and dry.     Comments: 5 x 3 cm indurated, tender, erythematous mass in the  right axillary mass with a central 2 x 2 cm fluctuant area. No discharge.  Neurological:     Mental Status: She is alert.     Coordination: Coordination normal.     Gait: Gait normal.  Psychiatric:        Mood and Affect: Mood normal.        Behavior: Behavior normal.        Thought Content: Thought content normal.        Judgment: Judgment normal.     Lab Review:     Component Value Date/Time   NA 143 08/20/2019 1132   K 3.5 08/20/2019 1132   CL 107 08/20/2019 1132   CO2 28 08/20/2019 1132   GLUCOSE 88 08/20/2019 1132   BUN 7 08/20/2019 1132   CREATININE 0.69 08/20/2019 1132   CREATININE 0.66 05/14/2019 1529   CALCIUM 9.1 08/20/2019 1132   PROT 6.7 08/20/2019 1132   ALBUMIN 3.7 08/20/2019 1132   AST 16 08/20/2019 1132   ALT 20 08/20/2019 1132   ALKPHOS 56 08/20/2019 1132   BILITOT 0.5 08/20/2019 1132   GFRNONAA >60 08/20/2019 1132   GFRNONAA 113 05/14/2019 1529   GFRAA >60 08/20/2019 1132   GFRAA 131 05/14/2019 1529       Component Value Date/Time   WBC 4.2 08/20/2019 1132    RBC 4.27 08/20/2019 1132   HGB 12.6 08/20/2019 1132   HGB 11.3 (L) 07/09/2019 1009   HCT 38.4 08/20/2019 1132   PLT 261 08/20/2019 1132   PLT 265 07/09/2019 1009   MCV 89.9 08/20/2019 1132   MCH 29.5 08/20/2019 1132   MCHC 32.8 08/20/2019 1132   RDW 21.6 (H) 08/20/2019 1132   LYMPHSABS 1.9 08/20/2019 1132   MONOABS 0.8 08/20/2019 1132   EOSABS 0.2 08/20/2019 1132   BASOSABS 0.0 08/20/2019 1132   -------------------------------  Imaging from last 24 hours (if applicable):  Radiology interpretation: NM PET Image Restag (PS) Skull Base To Thigh  Result Date: 08/13/2019 CLINICAL DATA:  Subsequent treatment strategy for Hodgkin's lymphoma. EXAM: NUCLEAR MEDICINE PET SKULL BASE TO THIGH TECHNIQUE: 9.7 mCi F-18 FDG was injected intravenously. Full-ring PET imaging was performed from the skull base to thigh after the radiotracer. CT data was obtained and used for attenuation correction and anatomic localization. Fasting blood glucose: 95 mg/dl COMPARISON:  None. FINDINGS: Mediastinal blood pool activity: SUV max 1.97 Liver activity: SUV max 3.22 NECK: Supraclavicular lymph nodes are reduced significantly in metabolic activity. Example RIGHT supraclavicular node with SUV max equal 1.9 decreased from 12.5. Additionally lymph node is decreased in size measuring 10 mm short axis compared to 20 mm. Incidental CT findings: none CHEST: Marked reduction in metabolic activity of mediastinal lymph nodes. For example RIGHT lower paratracheal lymph node with SUV max equal 2.7 compared SUV max equal 10.7. Lymph node reduced in size measuring 13 mm compared to 20 mm. Large RIGHT lower paratracheal lymph node measures 23 mm compared with 35 mm with SUV max equal 2.9 decreased from SUV max equal 10.9. All of the mediastinal lymph nodes have metabolic activity less than liver activity. Resolution of LEFT hypermetabolic axillary adenopathy Single focus of intense metabolic activity associated with the skin anterior to  the RIGHT axilla. This activity is intense with SUV max equal  7.9. There is subtle focus of nodularity in the skin at this level measuring 9 mm (image 40/4). Incidental CT findings: No suspicious pulmonary nodules. Port in the anterior chest wall with tip in distal SVC. ABDOMEN/PELVIS: Reduction in size and metabolic activity of previously hypermetabolic gastrohepatic ligament lymph nodes. No hypermetabolic abdominopelvic lymph nodes present. Spleen is normal size and normal metabolic activity. No evidence of liver metastasis.  Pancreas normal. Incidental CT findings: IUD within the uterus. SKELETON: No focal hypermetabolic activity to suggest skeletal metastasis. Incidental CT findings: none IMPRESSION: 1. Reduction size and metabolic activity of supraclavicular lymph nodes to less than blood pool activity ( [place] 2) 2. Reduction in size and metabolic activity bulky mediastinal adenopathy. Activity of residual enlarged lymph nodes is greater than blood pool but less than liver activity ( [place] 3) 3. Resolution gastrohepatic adenopathy [place] 1 4. Normal spleen and bone marrow. 5. Small focus of intense activity associated with cutaneous nodule anterior to the RIGHT axilla is indeterminate. Favor cutaneous infection over lymphoma. Recommend physical exam. Electronically Signed   By: Suzy Bouchard M.D.   On: 08/13/2019 09:16

## 2019-09-01 NOTE — Progress Notes (Signed)
HEMATOLOGY/ONCOLOGY CLINIC NOTE  Date of Service: 09/02/19   Patient Care Team: [name], MD as PCP - General (Family Medicine)  CHIEF COMPLAINTS/PURPOSE OF CONSULTATION:   Continue management of classical hodgkins lymphoma   CURRENT TREATMENT: Cycle 3 day 1  HISTORY OF PRESENTING ILLNESS:   Elizabeth Beasley is a wonderful 38 y.o. female who has been referred to [place] by Dr Madilyn Fireman for evaluation and management of microcytic anemia/cervical lymphadenopathy.The pt reports that she has a history of iron deficiency but has never had a transfusion. Pt has had two pregnancies and has two children, ages 37 and 11. There was a concern for anemia with her second pregnancy so she was given an erythropoietin shot. She has had a copper IUD for about 2 years and her menstrual cycle has been slightly longer since getting the IUD. She has been taking 28 mg TID PO iron since June. Pt has no known medication allergies and has had a previous removal of Bartholin's gland cyst. Pt has had no known contact with chemicals either through work or hobbies. She has no history of asthma or heavy smoke/aerosol exposure.   Pt did not notice any cervical lymphadenopathy in June. In late July, early August she began to experience extreme fatigue and could begin to feel small lumps in her neck. Her fatigue is improved with taking her PO Iron TID. She has experienced a couple of episodes of night sweats, with the latest occurring in late August. Pt has also had <10 lbs loss in 2 months. She has not had any rashes but is experiencing itching in her palms, broader itching after hot showers.   Pt is currently working from home. She has had her seasonal flu shot. Pt is not planning on having more children and is not concerned about fertility. Pt's husband would like to take a drive to the [facility] next month. Pt would not want any blood product transfusions, as she is a Jehovah's Witness. She is  comfortable with IV iron infusions.   INTERVAL HISTORY:  Elizabeth Beasley is a 38 y.o. female here for evaluation and management of hodgkins lymphoma. The patient's last visit with [place] was on 08/20/2019. The pt reports that she is doing well overall.  The pt reports that she feels okay.   She has discomfort from the nodule under her right armpit.   She still has numbness in her finger tips.   Lab results today (09/02/19) of CBC w/diff and CMP is as follows: all values are WNL except for Glucose Bld at 124, AST at 13, RDW at 18.9, Neutro Abs at 1.2, Monocytes Absolute at 1.1.  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     MEDICAL HISTORY:  Past Medical History:  Diagnosis Date  . Anemia   . Bartholin gland cyst   . Hodgkin's lymphoma, nodular sclerosis ([place]) 06/08/2019  . Hx of seasonal allergies   . Lymphadenopathy, cervical 05/21/2019  . PONV (postoperative nausea and vomiting)   . 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 CESAREAN SECTION  2010  . MASS EXCISION Right 05/21/2019   Procedure: EXCISION OF RIGHT DEEP NECK LYMPH NODE;  Surgeon: Fanny Skates, MD;  Location: [place];  Service: General;  Laterality: Right;  . PORTACATH PLACEMENT Right 06/08/2019   Procedure: INSERTION PORT-A-CATH WITH ULTRASOUND GUIDANCE;  Surgeon: Fanny Skates, MD;  Location: [place];  Service: General;  Laterality: Right;  . URETHRAL CYST REMOVAL N/A 08/30/2016   Procedure: BARTHOLIN'S GLAND CYST EXCISION;  Surgeon: Ardis Hughs, MD;  Location: [facility];  Service: Urology;  Laterality: N/A;  . v back     2012    SOCIAL HISTORY: Social History   Socioeconomic History  . Marital status:  Married    Spouse name: Montine Circle  . Number of children: 2  . Years of education: Assoc  . Highest education level: Not on file  Occupational History  . Occupation: CMA    Employer: [employer]  Tobacco Use  . Smoking status: Never Smoker  . Smokeless tobacco: Never Used  Substance and Sexual Activity  . Alcohol use: Not Currently    Comment: ocassional  . Drug use: No  . Sexual activity: Yes    Partners: Male    Birth control/protection: I.U.D.  Other Topics Concern  . Not on file  Social History Narrative   Regular exercise. No regular caffeine.   Social Determinants of Health   Financial Resource Strain:   . Difficulty of Paying Living Expenses: Not on file  Food Insecurity:   . Worried About Charity fundraiser in the Last Year: Not on file  . Ran Out of Food in the Last Year: Not on file  Transportation Needs:   . Lack of Transportation (Medical): Not on file  . Lack of Transportation (Non-Medical): Not on file  Physical Activity:   . Days of Exercise per Week: Not on file  . Minutes of Exercise per Session: Not on file  Stress:   . Feeling of Stress : Not on file  Social Connections:   . Frequency of Communication with Friends and Family: Not on file  . Frequency of Social Gatherings with Friends and Family: Not on file  . Attends Religious Services: Not on file  . Active Member of Clubs or Organizations: Not on file  . Attends Archivist Meetings: Not on file  . Marital Status: Not on file  Intimate Partner Violence:   . Fear of Current or Ex-Partner: Not on file  . Emotionally Abused: Not on file  . Physically Abused: Not on file  . Sexually Abused: Not on file    FAMILY HISTORY: Family History  Problem Relation Age of Onset  . Colon cancer Father 16  . Colon polyps Mother   . Hypertension Mother   . Diabetes Paternal Grandmother   . Stroke Paternal Grandmother   . Alcohol abuse Paternal Uncle   . Colon cancer Paternal Aunt 52  . Esophageal  cancer Neg Hx   . Rectal cancer Neg Hx   . Stomach cancer Neg Hx    On PMHx the pt reports C-section, Bartholin's Gland Cyst removal. On Social Hx the pt reports non smoker, no alcohol use. On Family Hx the pt reports father passed from colon cancer, mother has colon polyps.  ALLERGIES:  has No Known Allergies.  MEDICATIONS:  Current Outpatient Medications  Medication Sig Dispense Refill  . b complex vitamins capsule Take 1 capsule by mouth daily.    . cholecalciferol (VITAMIN D3) 25 MCG (1000 UT) tablet Take 2,000 Units by mouth daily.    Marland Kitchen dexamethasone (DECADRON) 4 MG tablet Take 2 tablets by mouth once a day on the  day after chemotherapy and then take 2 tablets two times a day for 2 days. Take with food. (Patient not taking: Reported on 06/15/2019) 30 tablet 1  . fexofenadine (ALLEGRA) 180 MG tablet Take 180 mg by mouth daily as needed for allergies or rhinitis.    . fluconazole (DIFLUCAN) 150 MG tablet Take 1 tablet for a yeast infection if needed, may repeat in 3 days if needed 2 tablet 0  . LORazepam (ATIVAN) 0.5 MG tablet Take 1 tablet (0.5 mg total) by mouth every 6 (six) hours as needed (Nausea or vomiting). (Patient not taking: Reported on 06/15/2019) 30 tablet 0  . ondansetron (ZOFRAN) 8 MG tablet Take 1 tablet (8 mg total) by mouth 2 (two) times daily as needed. Start on the third day after chemotherapy. (Patient not taking: Reported on 06/15/2019) 30 tablet 1  . potassium chloride SA (KLOR-CON) 20 MEQ tablet Take 1 tablet (20 mEq total) by mouth 2 (two) times daily. (Patient not taking: Reported on 08/20/2019) 60 tablet 1  . prochlorperazine (COMPAZINE) 10 MG tablet Take 1 tablet (10 mg total) by mouth every 6 (six) hours as needed (Nausea or vomiting). (Patient not taking: Reported on 06/15/2019) 30 tablet 1  . sulfamethoxazole-trimethoprim (BACTRIM DS) 800-160 MG tablet Take 1 tablet by mouth 2 (two) times daily. 14 tablet 0   No current facility-administered medications for this  visit.   Facility-Administered Medications Ordered in Other Visits  Medication Dose Route Frequency Provider Last Rate Last Admin  . sodium chloride flush (NS) 0.9 % injection 10 mL  10 mL Intracatheter PRN Brunetta Genera, MD   10 mL at 08/05/19 1658    REVIEW OF SYSTEMS:   A 10+ POINT REVIEW OF SYSTEMS WAS OBTAINED including neurology, dermatology, psychiatry, cardiac, respiratory, lymph, extremities, GI, GU, Musculoskeletal, constitutional, breasts, reproductive, HEENT.  All pertinent positives are noted in the HPI.  All others are negative.     PHYSICAL EXAMINATION: ECOG FS:1 - Symptomatic but completely ambulatory  Vitals:   09/02/19 1216  BP: 116/75  Pulse: (!) 111  Resp: 18  Temp: 98.3 F (36.8 C)  SpO2: 98%   Wt Readings from Last 3 Encounters:  09/02/19 201 lb 6.4 oz (91.4 kg)  08/30/19 198 lb 8 oz (90 kg)  08/20/19 202 lb (91.6 kg)   Body mass index is 34.57 kg/m.    GENERAL:alert, in no acute distress and comfortable SKIN: no acute rashes, no significant lesions EYES: conjunctiva are pink and non-injected, sclera anicteric OROPHARYNX: MMM, no exudates, no oropharyngeal erythema or ulceration NECK: supple, no JVD LYMPH:  no palpable lymphadenopathy in the cervical, axillary or inguinal regions LUNGS: clear to auscultation b/l with normal respiratory effort HEART: regular rate & rhythm ABDOMEN:  normoactive bowel sounds , non tender, not distended. Extremity: no pedal edema PSYCH: alert & oriented x 3 with fluent speech NEURO: no focal motor/sensory deficits   LABORATORY DATA:  I have reviewed the data as listed  - 05/14/2019 Fe+TIBC+Fer is as follows: Iron at 11, TIBC at 258, % Sat at 4, Ferritin at 50. -Discussed 05/21/2019 Lymph Node Bx [id number]) which revealed " CLASSIC HODGKIN LYMPHOMA."  - 05/19/2019 CT C/A/P ([id number]) which revealed  "1. Enlarged mediastinal, bilateral hilar and lower cervical lymph nodes. Primary considerations include  lymphoma, sarcoid and less likely metastatic disease. Normal spleen. No evidence of enlarged lymph nodes within the abdomen or pelvis. 2. Low lying IUD within the LOWER uterine segment and may extend to the cervix."  - 05/17/2019  CT Soft Tissue Neck w/contrast ([id number]) which revealed "Bulky right greater than left cervical and mediastinal lymphadenopathy concerning for lymphoma."  . CBC Latest Ref Rng & Units 09/02/2019 08/20/2019 08/05/2019  WBC 4.0 - 10.5 K/uL 4.2 4.2 7.9  Hemoglobin 12.0 - 15.0 g/dL 12.4 12.6 12.1  Hematocrit 36.0 - 46.0 % 36.8 38.4 37.2  Platelets 150 - 400 K/uL 266 261 226    . CMP Latest Ref Rng & Units 09/02/2019 08/20/2019 08/05/2019  Glucose 70 - 99 mg/dL 124(H) 88 96  BUN 6 - 20 mg/dL 12 7 9   Creatinine 0.44 - 1.00 mg/dL 0.79 0.69 0.70  Sodium 135 - 145 mmol/L 142 143 141  Potassium 3.5 - 5.1 mmol/L 3.5 3.5 3.7  Chloride 98 - 111 mmol/L 104 107 105  CO2 22 - 32 mmol/L 25 28 25   Calcium 8.9 - 10.3 mg/dL 8.9 9.1 8.9  Total Protein 6.5 - 8.1 g/dL 6.8 6.7 6.6  Total Bilirubin 0.3 - 1.2 mg/dL 0.3 0.5 0.3  Alkaline Phos 38 - 126 U/L 56 56 57  AST 15 - 41 U/L 13(L) 16 11(L)  ALT 0 - 44 U/L 18 20 17    . Lab Results  Component Value Date   IRON 79 07/09/2019   TIBC 221 (L) 07/09/2019   IRONPCTSAT 36 07/09/2019   (Iron and TIBC)  Lab Results  Component Value Date   FERRITIN 590 (H) 07/09/2019  08/13/2019 NM PET Image Restag (PS) Skull Base To Thigh (Accession [id number])   05/21/2019 [id number]) Lymph Node Biopsy    RADIOGRAPHIC STUDIES: I have personally reviewed the radiological images as listed and agreed with the findings in the report. NM PET Image Restag (PS) Skull Base To Thigh  Result Date: 08/13/2019 CLINICAL DATA:  Subsequent treatment strategy for Hodgkin's lymphoma. EXAM: NUCLEAR MEDICINE PET SKULL BASE TO THIGH TECHNIQUE: 9.7 mCi F-18 FDG was injected intravenously. Full-ring PET imaging was performed from the skull base to thigh after  the radiotracer. CT data was obtained and used for attenuation correction and anatomic localization. Fasting blood glucose: 95 mg/dl COMPARISON:  None. FINDINGS: Mediastinal blood pool activity: SUV max 1.97 Liver activity: SUV max 3.22 NECK: Supraclavicular lymph nodes are reduced significantly in metabolic activity. Example RIGHT supraclavicular node with SUV max equal 1.9 decreased from 12.5. Additionally lymph node is decreased in size measuring 10 mm short axis compared to 20 mm. Incidental CT findings: none CHEST: Marked reduction in metabolic activity of mediastinal lymph nodes. For example RIGHT lower paratracheal lymph node with SUV max equal 2.7 compared SUV max equal 10.7. Lymph node reduced in size measuring 13 mm compared to 20 mm. Large RIGHT lower paratracheal lymph node measures 23 mm compared with 35 mm with SUV max equal 2.9 decreased from SUV max equal 10.9. All of the mediastinal lymph nodes have metabolic activity less than liver activity. Resolution of LEFT hypermetabolic axillary adenopathy Single focus of intense metabolic activity associated with the skin anterior to the RIGHT axilla. This activity is intense with SUV max equal 7.9. There is subtle focus of nodularity in the skin at this level measuring 9 mm (image 40/4). Incidental CT findings: No suspicious pulmonary nodules. Port in the anterior chest wall with tip in distal SVC. ABDOMEN/PELVIS: Reduction in size and metabolic activity of previously hypermetabolic gastrohepatic ligament lymph nodes. No hypermetabolic abdominopelvic lymph nodes present. Spleen is normal size and normal metabolic activity. No evidence of liver metastasis.  Pancreas normal. Incidental CT findings: IUD within the uterus. SKELETON: No focal  hypermetabolic activity to suggest skeletal metastasis. Incidental CT findings: none IMPRESSION: 1. Reduction size and metabolic activity of supraclavicular lymph nodes to less than blood pool activity ( [place] 2) 2.  Reduction in size and metabolic activity bulky mediastinal adenopathy. Activity of residual enlarged lymph nodes is greater than blood pool but less than liver activity ( [place] 3) 3. Resolution gastrohepatic adenopathy [place] 1 4. Normal spleen and bone marrow. 5. Small focus of intense activity associated with cutaneous nodule anterior to the RIGHT axilla is indeterminate. Favor cutaneous infection over lymphoma. Recommend physical exam. Electronically Signed   By: Suzy Bouchard M.D.   On: 08/13/2019 09:16    ASSESSMENT & PLAN:   1) Recently diagnosed Classical Hodgkins lymphoma Likely stage IIIB based on imaging thus far. 2) Microcytic Anemia due to anemia of chronic disease and Iron deficiency- hg improved to 11.3 3) Jehovas Witness   PLAN: -Discussed pt labwork today, 09/02/19; all values are WNL except for Glucose Bld at 124, AST at 13, RDW at 18.9, Neutro Abs at 1.2, Monocytes Absolute at 1.1. -Discussed 09/02/19 RDW at 18.9 -Discussed 09/02/19  Monocytes absolute at 1.1 -Advised that she is half way through treatment. -Discussed that the abscess under her right armpit might need to be aspirated and she will need to continue antibiotics. I will extend antibiotics to ensure complete resolution of axillary abscess.  -patient will proceed with C3D15 of treatment -Discussed that we will be adding the medication granix.  FOLLOW UP: -Granix daily for 3 days from 12/18 -plz move currently scheduled C4D1 from 12/31 to 09/19/2018 with labs, port flush, MD visit (can see in infusion room).  The total time spent in the appt was 25 minutes and more than 50% was on counseling and direct patient cares.  All of the patient's questions were answered with apparent satisfaction. The patient knows to call the clinic with any problems, questions or concerns.    Sullivan Lone MD MS AAHIVMS [facility] Hematology/Oncology Physician [facility]  (Office):       [phone] (Work  cell):  [phone] (Fax):           [phone]  09/01/2019 4:50 PM  I, Scot Dock, am acting as a scribe for Dr. Sullivan Lone.   .I have reviewed the above documentation for accuracy and completeness, and I agree with the above. Brunetta Genera MD

## 2019-09-02 ENCOUNTER — Inpatient Hospital Stay: Payer: No Typology Code available for payment source

## 2019-09-02 ENCOUNTER — Other Ambulatory Visit: Payer: Self-pay

## 2019-09-02 ENCOUNTER — Encounter: Payer: Self-pay | Admitting: Hematology

## 2019-09-02 ENCOUNTER — Inpatient Hospital Stay (HOSPITAL_BASED_OUTPATIENT_CLINIC_OR_DEPARTMENT_OTHER): Payer: No Typology Code available for payment source | Admitting: Hematology

## 2019-09-02 VITALS — BP 116/75 | HR 111 | Temp 98.3°F | Resp 18 | Ht 64.0 in | Wt 201.4 lb

## 2019-09-02 VITALS — HR 89

## 2019-09-02 DIAGNOSIS — C8111 Nodular sclerosis classical Hodgkin lymphoma, lymph nodes of head, face, and neck: Secondary | ICD-10-CM

## 2019-09-02 DIAGNOSIS — L02411 Cutaneous abscess of right axilla: Secondary | ICD-10-CM | POA: Diagnosis not present

## 2019-09-02 DIAGNOSIS — D500 Iron deficiency anemia secondary to blood loss (chronic): Secondary | ICD-10-CM

## 2019-09-02 DIAGNOSIS — C8110 Nodular sclerosis classical Hodgkin lymphoma, unspecified site: Secondary | ICD-10-CM | POA: Diagnosis not present

## 2019-09-02 DIAGNOSIS — Z5111 Encounter for antineoplastic chemotherapy: Secondary | ICD-10-CM

## 2019-09-02 DIAGNOSIS — Z95828 Presence of other vascular implants and grafts: Secondary | ICD-10-CM

## 2019-09-02 DIAGNOSIS — D709 Neutropenia, unspecified: Secondary | ICD-10-CM | POA: Diagnosis not present

## 2019-09-02 DIAGNOSIS — C8118 Nodular sclerosis classical Hodgkin lymphoma, lymph nodes of multiple sites: Secondary | ICD-10-CM

## 2019-09-02 DIAGNOSIS — Z7189 Other specified counseling: Secondary | ICD-10-CM

## 2019-09-02 LAB — CBC WITH DIFFERENTIAL/PLATELET
Abs Immature Granulocytes: 0.05 10*3/uL (ref 0.00–0.07)
Basophils Absolute: 0 10*3/uL (ref 0.0–0.1)
Basophils Relative: 1 %
Eosinophils Absolute: 0.1 10*3/uL (ref 0.0–0.5)
Eosinophils Relative: 2 %
HCT: 36.8 % (ref 36.0–46.0)
Hemoglobin: 12.4 g/dL (ref 12.0–15.0)
Immature Granulocytes: 1 %
Lymphocytes Relative: 43 %
Lymphs Abs: 1.8 10*3/uL (ref 0.7–4.0)
MCH: 30.2 pg (ref 26.0–34.0)
MCHC: 33.7 g/dL (ref 30.0–36.0)
MCV: 89.8 fL (ref 80.0–100.0)
Monocytes Absolute: 1.1 10*3/uL — ABNORMAL HIGH (ref 0.1–1.0)
Monocytes Relative: 25 %
Neutro Abs: 1.2 10*3/uL — ABNORMAL LOW (ref 1.7–7.7)
Neutrophils Relative %: 28 %
Platelets: 266 10*3/uL (ref 150–400)
RBC: 4.1 MIL/uL (ref 3.87–5.11)
RDW: 18.9 % — ABNORMAL HIGH (ref 11.5–15.5)
WBC: 4.2 10*3/uL (ref 4.0–10.5)
nRBC: 0 % (ref 0.0–0.2)

## 2019-09-02 LAB — CMP (CANCER CENTER ONLY)
ALT: 18 U/L (ref 0–44)
AST: 13 U/L — ABNORMAL LOW (ref 15–41)
Albumin: 3.5 g/dL (ref 3.5–5.0)
Alkaline Phosphatase: 56 U/L (ref 38–126)
Anion gap: 13 (ref 5–15)
BUN: 12 mg/dL (ref 6–20)
CO2: 25 mmol/L (ref 22–32)
Calcium: 8.9 mg/dL (ref 8.9–10.3)
Chloride: 104 mmol/L (ref 98–111)
Creatinine: 0.79 mg/dL (ref 0.44–1.00)
GFR, Est AFR Am: >60 mL/min (ref >60)
GFR, Estimated: >60 mL/min (ref >60)
Glucose, Bld: 124 mg/dL — ABNORMAL HIGH (ref 70–99)
Potassium: 3.5 mmol/L (ref 3.5–5.1)
Sodium: 142 mmol/L (ref 135–145)
Total Bilirubin: 0.3 mg/dL (ref 0.3–1.2)
Total Protein: 6.8 g/dL (ref 6.5–8.1)

## 2019-09-02 MED ORDER — SODIUM CHLORIDE 0.9 % IV SOLN
Freq: Once | INTRAVENOUS | Status: AC
  Filled 2019-09-02: qty 250

## 2019-09-02 MED ORDER — PALONOSETRON HCL INJECTION 0.25 MG/5ML
0.2500 mg | Freq: Once | INTRAVENOUS | Status: AC
  Administered 2019-09-02: 0.25 mg via INTRAVENOUS

## 2019-09-02 MED ORDER — MUPIROCIN 2 % EX OINT: 1.0000 "application " | TOPICAL_OINTMENT | Freq: Two times a day (BID) | CUTANEOUS | 1 refills | Status: DC

## 2019-09-02 MED ORDER — LORAZEPAM 2 MG/ML IJ SOLN
INTRAMUSCULAR | Status: AC
  Filled 2019-09-02: qty 1

## 2019-09-02 MED ORDER — SODIUM CHLORIDE 0.9 % IV SOLN
Freq: Once | INTRAVENOUS | Status: AC
  Filled 2019-09-02: qty 5

## 2019-09-02 MED ORDER — PALONOSETRON HCL INJECTION 0.25 MG/5ML
INTRAVENOUS | Status: AC
  Filled 2019-09-02: qty 5

## 2019-09-02 MED ORDER — SULFAMETHOXAZOLE-TRIMETHOPRIM 800-160 MG PO TABS: 1.0000 | ORAL_TABLET | Freq: Two times a day (BID) | ORAL | 0 refills | Status: DC

## 2019-09-02 MED ORDER — HEPARIN SOD (PORK) LOCK FLUSH 100 UNIT/ML IV SOLN
500.0000 [IU] | Freq: Once | INTRAVENOUS | Status: AC | PRN
  Administered 2019-09-02: 500 [IU]
  Filled 2019-09-02: qty 5

## 2019-09-02 MED ORDER — SODIUM CHLORIDE 0.9% FLUSH
10.0000 mL | Freq: Once | INTRAVENOUS | Status: AC
  Administered 2019-09-02: 10 mL
  Filled 2019-09-02: qty 10

## 2019-09-02 MED ORDER — VINBLASTINE SULFATE CHEMO INJECTION 1 MG/ML
6.0000 mg/m2 | Freq: Once | INTRAVENOUS | Status: AC
  Administered 2019-09-02: 12 mg via INTRAVENOUS
  Filled 2019-09-02: qty 12

## 2019-09-02 MED ORDER — SODIUM CHLORIDE 0.9% FLUSH
10.0000 mL | INTRAVENOUS | Status: DC | PRN
  Administered 2019-09-02: 10 mL
  Filled 2019-09-02: qty 10

## 2019-09-02 MED ORDER — SODIUM CHLORIDE 0.9 % IV SOLN
375.0000 mg/m2 | Freq: Once | INTRAVENOUS | Status: AC
  Administered 2019-09-02: 750 mg via INTRAVENOUS
  Filled 2019-09-02: qty 75

## 2019-09-02 MED ORDER — DOXORUBICIN HCL CHEMO IV INJECTION 2 MG/ML
25.0000 mg/m2 | Freq: Once | INTRAVENOUS | Status: AC
  Administered 2019-09-02: 50 mg via INTRAVENOUS
  Filled 2019-09-02: qty 25

## 2019-09-02 MED ORDER — LORAZEPAM 2 MG/ML IJ SOLN
0.5000 mg | Freq: Once | INTRAMUSCULAR | Status: AC
  Administered 2019-09-02: 0.5 mg via INTRAVENOUS

## 2019-09-02 MED FILL — MUPIROCIN 2% OINTMENT: 2 | 10 days supply | Qty: 22 | Fill #0

## 2019-09-02 NOTE — Progress Notes (Signed)
Verbal order/Dr.Kale: Ok to receive treatment today with ANC 1.2

## 2019-09-02 NOTE — Patient Instructions (Signed)
[place] Discharge Instructions for Patients Receiving Chemotherapy   Today you received the following chemotherapy agents:Adriamycin, Venblastine, Dacarbazine  To help prevent nausea and vomiting after your treatment, we encourage you to take your nausea medication as directed.    If you develop nausea and vomiting that is not controlled by your nausea medication, call the clinic.   BELOW ARE SYMPTOMS THAT SHOULD BE REPORTED IMMEDIATELY:  *FEVER GREATER THAN 100.5 F  *CHILLS WITH OR WITHOUT FEVER  NAUSEA AND VOMITING THAT IS NOT CONTROLLED WITH YOUR NAUSEA MEDICATION  *UNUSUAL SHORTNESS OF BREATH  *UNUSUAL BRUISING OR BLEEDING  TENDERNESS IN MOUTH AND THROAT WITH OR WITHOUT PRESENCE OF ULCERS  *URINARY PROBLEMS  *BOWEL PROBLEMS  UNUSUAL RASH Items with * indicate a potential emergency and should be followed up as soon as possible.  Feel free to call the clinic should you have any questions or concerns. The clinic phone number is [phone].  Please show the [place] at check-in to the Emergency Department and triage nurse.

## 2019-09-03 ENCOUNTER — Telehealth: Payer: Self-pay | Admitting: Hematology

## 2019-09-03 ENCOUNTER — Other Ambulatory Visit: Payer: Self-pay

## 2019-09-03 ENCOUNTER — Inpatient Hospital Stay: Payer: No Typology Code available for payment source

## 2019-09-03 VITALS — BP 118/64 | HR 97 | Temp 98.0°F | Resp 18

## 2019-09-03 DIAGNOSIS — C8111 Nodular sclerosis classical Hodgkin lymphoma, lymph nodes of head, face, and neck: Secondary | ICD-10-CM

## 2019-09-03 DIAGNOSIS — Z5111 Encounter for antineoplastic chemotherapy: Secondary | ICD-10-CM | POA: Diagnosis not present

## 2019-09-03 DIAGNOSIS — Z7189 Other specified counseling: Secondary | ICD-10-CM

## 2019-09-03 MED ORDER — TBO-FILGRASTIM 480 MCG/0.8ML ~~LOC~~ SOSY
PREFILLED_SYRINGE | SUBCUTANEOUS | Status: AC
  Filled 2019-09-03: qty 0.8

## 2019-09-03 MED ORDER — TBO-FILGRASTIM 480 MCG/0.8ML ~~LOC~~ SOSY
480.0000 ug | PREFILLED_SYRINGE | Freq: Once | SUBCUTANEOUS | Status: AC
  Administered 2019-09-03: 480 ug via SUBCUTANEOUS

## 2019-09-03 NOTE — Telephone Encounter (Signed)
Contacted patient by phone in response to question. First Granix injection (1/3) scheduled for 12/18 at 3PM, remaining 2 injections to be scheduled for 12/19 and 12/21 - per patient, scheduling will contact her later today. Patient reports  Patient reports area under right arm less painful and not as swollen today. Dr. Irene Limbo informed.

## 2019-09-03 NOTE — Telephone Encounter (Signed)
Scheduled appt per 12/17 los.  Spoke with pt and she is aware of her appt dates and time.  Sent treatment for 01/04 as an add-on.  Will contact patient again once treatment has been added.

## 2019-09-04 ENCOUNTER — Inpatient Hospital Stay: Payer: No Typology Code available for payment source

## 2019-09-04 VITALS — BP 104/60 | HR 93 | Temp 98.0°F | Resp 20

## 2019-09-04 DIAGNOSIS — Z7189 Other specified counseling: Secondary | ICD-10-CM

## 2019-09-04 DIAGNOSIS — Z5111 Encounter for antineoplastic chemotherapy: Secondary | ICD-10-CM | POA: Diagnosis not present

## 2019-09-04 DIAGNOSIS — C8111 Nodular sclerosis classical Hodgkin lymphoma, lymph nodes of head, face, and neck: Secondary | ICD-10-CM

## 2019-09-04 MED ORDER — TBO-FILGRASTIM 480 MCG/0.8ML ~~LOC~~ SOSY
PREFILLED_SYRINGE | SUBCUTANEOUS | Status: AC
  Filled 2019-09-04: qty 0.8

## 2019-09-04 MED ORDER — TBO-FILGRASTIM 480 MCG/0.8ML ~~LOC~~ SOSY
480.0000 ug | PREFILLED_SYRINGE | Freq: Once | SUBCUTANEOUS | Status: AC
  Administered 2019-09-04: 480 ug via SUBCUTANEOUS

## 2019-09-04 NOTE — Patient Instructions (Signed)
Tbo-Filgrastim injection What is this medicine? TBO-FILGRASTIM (T B O fil GRA stim) is a granulocyte colony-stimulating factor that helps you make more neutrophils, a type of white blood cell. Neutrophils are important for fighting infections. Some chemotherapy affects your bone marrow and lowers your neutrophils. This medicine helps decrease the length of time that neutrophils are very low (severe neutropenia). This medicine may be used for other purposes; ask your health care provider or pharmacist if you have questions. COMMON BRAND NAME(S): Granix What should I tell my health care provider before I take this medicine? They need to know if you have any of these conditions:  bone scan or tests planned  kidney disease  sickle cell anemia  an unusual or allergic reaction to tbo-filgrastim, filgrastim, pegfilgrastim, other medicines, foods, dyes, or preservatives  pregnant or trying to get pregnant  breast-feeding How should I use this medicine? This medicine is for injection under the skin. If you get this medicine at home, you will be taught how to prepare and give this medicine. Refer to the Instructions for Use that come with your medication packaging. Use exactly as directed. Take your medicine at regular intervals. Do not take your medicine more often than directed. It is important that you put your used needles and syringes in a special sharps container. Do not put them in a trash can. If you do not have a sharps container, call your pharmacist or healthcare provider to get one. Talk to your pediatrician regarding the use of this medicine in children. While this drug may be prescribed for children as young as 1 month of age for selected conditions, precautions do apply. Overdosage: If you think you have taken too much of this medicine contact a poison control center or emergency room at once. NOTE: This medicine is only for you. Do not share this medicine with others. What if I miss a  dose? It is important not to miss your dose. Call your doctor or health care professional if you miss a dose. What may interact with this medicine? This medicine may interact with the following medications:  medicines that may cause a release of neutrophils, such as lithium This list may not describe all possible interactions. Give your health care provider a list of all the medicines, herbs, non-prescription drugs, or dietary supplements you use. Also tell them if you smoke, drink alcohol, or use illegal drugs. Some items may interact with your medicine. What should I watch for while using this medicine? You may need blood work done while you are taking this medicine. What side effects may I notice from receiving this medicine? Side effects that you should report to your doctor or health care professional as soon as possible:  allergic reactions like skin rash, itching or hives, swelling of the face, lips, or tongue  back pain  blood in the urine  dark urine  dizziness  fast heartbeat  feeling faint  shortness of breath or breathing problems  signs and symptoms of infection like fever or chills; cough; or sore throat  signs and symptoms of kidney injury like trouble passing urine or change in the amount of urine  stomach or side pain, or pain at the shoulder  sweating  swelling of the legs, ankles, or abdomen  tiredness Side effects that usually do not require medical attention (report to your doctor or health care professional if they continue or are bothersome):  bone pain  diarrhea  headache  muscle pain  vomiting This list may   not describe all possible side effects. Call your doctor for medical advice about side effects. You may report side effects to FDA at 1-800-FDA-1088. Where should I keep my medicine? Keep out of the reach of children. Store in a refrigerator between 2 and 8 degrees C (36 and 46 degrees F). Keep in carton to protect from light. Throw  away this medicine if it is left out of the refrigerator for more than 5 consecutive days. Throw away any unused medicine after the expiration date. NOTE: This sheet is a summary. It may not cover all possible information. If you have questions about this medicine, talk to your doctor, pharmacist, or health care provider.  2020 Elsevier/Gold Standard (2018-07-04 19:58:39)  

## 2019-09-06 ENCOUNTER — Other Ambulatory Visit: Payer: Self-pay

## 2019-09-06 ENCOUNTER — Telehealth: Payer: Self-pay | Admitting: *Deleted

## 2019-09-06 ENCOUNTER — Inpatient Hospital Stay: Payer: No Typology Code available for payment source

## 2019-09-06 VITALS — BP 115/73 | HR 73 | Temp 97.9°F | Resp 18

## 2019-09-06 DIAGNOSIS — Z7189 Other specified counseling: Secondary | ICD-10-CM

## 2019-09-06 DIAGNOSIS — Z5111 Encounter for antineoplastic chemotherapy: Secondary | ICD-10-CM | POA: Diagnosis not present

## 2019-09-06 DIAGNOSIS — C8111 Nodular sclerosis classical Hodgkin lymphoma, lymph nodes of head, face, and neck: Secondary | ICD-10-CM

## 2019-09-06 MED ORDER — TBO-FILGRASTIM 480 MCG/0.8ML ~~LOC~~ SOSY
480.0000 ug | PREFILLED_SYRINGE | Freq: Once | SUBCUTANEOUS | Status: AC
  Administered 2019-09-06: 480 ug via SUBCUTANEOUS

## 2019-09-06 MED ORDER — TBO-FILGRASTIM 480 MCG/0.8ML ~~LOC~~ SOSY
PREFILLED_SYRINGE | SUBCUTANEOUS | Status: AC
  Filled 2019-09-06: qty 0.8

## 2019-09-06 MED FILL — SULFAMETHOXAZOLE-TMP DS TAB: 800-160 | 7 days supply | Qty: 14 | Fill #0

## 2019-09-06 NOTE — Patient Instructions (Signed)
Tbo-Filgrastim injection What is this medicine? TBO-FILGRASTIM (T B O fil GRA stim) is a granulocyte colony-stimulating factor that helps you make more neutrophils, a type of white blood cell. Neutrophils are important for fighting infections. Some chemotherapy affects your bone marrow and lowers your neutrophils. This medicine helps decrease the length of time that neutrophils are very low (severe neutropenia). This medicine may be used for other purposes; ask your health care provider or pharmacist if you have questions. COMMON BRAND NAME(S): Granix What should I tell my health care provider before I take this medicine? They need to know if you have any of these conditions:  bone scan or tests planned  kidney disease  sickle cell anemia  an unusual or allergic reaction to tbo-filgrastim, filgrastim, pegfilgrastim, other medicines, foods, dyes, or preservatives  pregnant or trying to get pregnant  breast-feeding How should I use this medicine? This medicine is for injection under the skin. If you get this medicine at home, you will be taught how to prepare and give this medicine. Refer to the Instructions for Use that come with your medication packaging. Use exactly as directed. Take your medicine at regular intervals. Do not take your medicine more often than directed. It is important that you put your used needles and syringes in a special sharps container. Do not put them in a trash can. If you do not have a sharps container, call your pharmacist or healthcare provider to get one. Talk to your pediatrician regarding the use of this medicine in children. While this drug may be prescribed for children as young as 1 month of age for selected conditions, precautions do apply. Overdosage: If you think you have taken too much of this medicine contact a poison control center or emergency room at once. NOTE: This medicine is only for you. Do not share this medicine with others. What if I miss a  dose? It is important not to miss your dose. Call your doctor or health care professional if you miss a dose. What may interact with this medicine? This medicine may interact with the following medications:  medicines that may cause a release of neutrophils, such as lithium This list may not describe all possible interactions. Give your health care provider a list of all the medicines, herbs, non-prescription drugs, or dietary supplements you use. Also tell them if you smoke, drink alcohol, or use illegal drugs. Some items may interact with your medicine. What should I watch for while using this medicine? You may need blood work done while you are taking this medicine. What side effects may I notice from receiving this medicine? Side effects that you should report to your doctor or health care professional as soon as possible:  allergic reactions like skin rash, itching or hives, swelling of the face, lips, or tongue  back pain  blood in the urine  dark urine  dizziness  fast heartbeat  feeling faint  shortness of breath or breathing problems  signs and symptoms of infection like fever or chills; cough; or sore throat  signs and symptoms of kidney injury like trouble passing urine or change in the amount of urine  stomach or side pain, or pain at the shoulder  sweating  swelling of the legs, ankles, or abdomen  tiredness Side effects that usually do not require medical attention (report to your doctor or health care professional if they continue or are bothersome):  bone pain  diarrhea  headache  muscle pain  vomiting This list may   not describe all possible side effects. Call your doctor for medical advice about side effects. You may report side effects to FDA at 1-800-FDA-1088. Where should I keep my medicine? Keep out of the reach of children. Store in a refrigerator between 2 and 8 degrees C (36 and 46 degrees F). Keep in carton to protect from light. Throw  away this medicine if it is left out of the refrigerator for more than 5 consecutive days. Throw away any unused medicine after the expiration date. NOTE: This sheet is a summary. It may not cover all possible information. If you have questions about this medicine, talk to your doctor, pharmacist, or health care provider.  2020 Elsevier/Gold Standard (2018-07-04 19:58:39)  

## 2019-09-06 NOTE — Telephone Encounter (Signed)
Pt called and left message requesting a letter for her daughter 's school.  Stated her daughter is having a test in January that would require to be in person.  Pt would like a letter stating that pt is under active chemo treatment, and will need to have minimal exposures since pt is immunocompromised from chemo. Pt stated her daughter's school will accept letter from MD. Pt's   Phone    [phone].

## 2019-09-08 ENCOUNTER — Encounter: Payer: Self-pay | Admitting: *Deleted

## 2019-09-09 ENCOUNTER — Telehealth: Payer: Self-pay | Admitting: *Deleted

## 2019-09-09 ENCOUNTER — Telehealth: Payer: Self-pay | Admitting: Medical

## 2019-09-09 NOTE — Telephone Encounter (Signed)
Contacted patient - letter written as requested with MD approval (see previous note). She would like to have it mailed to her home. Mailed 09/09/2019. Patient states that she is taking second round of antibiotics for abcess (closed) in right axilla. Antibiotic ends Monday. Dr. Irene Limbo had delayed draining abscess as discussed in last appt as it was decreasing in size with 2nd round of antibiotic. Today is slightly increased in size and more tender. In Dr. Grier Mitts absence, will schedule for [facility] for evaluation. Patient states she can come on Monday morning after completing 2nd round of antibiotic. Schedule message sent.

## 2019-09-09 NOTE — Telephone Encounter (Signed)
Scheduled appt per 12/24 sch message - pt aware of appt date and time on 12/28

## 2019-09-13 ENCOUNTER — Inpatient Hospital Stay (HOSPITAL_BASED_OUTPATIENT_CLINIC_OR_DEPARTMENT_OTHER): Payer: No Typology Code available for payment source | Admitting: Medical

## 2019-09-13 ENCOUNTER — Other Ambulatory Visit: Payer: Self-pay

## 2019-09-13 ENCOUNTER — Inpatient Hospital Stay: Payer: No Typology Code available for payment source

## 2019-09-13 VITALS — BP 106/65 | HR 91 | Temp 98.0°F | Resp 17 | Ht 64.0 in | Wt 203.5 lb

## 2019-09-13 DIAGNOSIS — L03111 Cellulitis of right axilla: Secondary | ICD-10-CM | POA: Diagnosis not present

## 2019-09-13 DIAGNOSIS — Z95828 Presence of other vascular implants and grafts: Secondary | ICD-10-CM | POA: Diagnosis not present

## 2019-09-13 DIAGNOSIS — Z5111 Encounter for antineoplastic chemotherapy: Secondary | ICD-10-CM

## 2019-09-13 DIAGNOSIS — Z7189 Other specified counseling: Secondary | ICD-10-CM

## 2019-09-13 DIAGNOSIS — L0291 Cutaneous abscess, unspecified: Secondary | ICD-10-CM | POA: Diagnosis not present

## 2019-09-13 DIAGNOSIS — C8118 Nodular sclerosis classical Hodgkin lymphoma, lymph nodes of multiple sites: Secondary | ICD-10-CM

## 2019-09-13 DIAGNOSIS — D709 Neutropenia, unspecified: Secondary | ICD-10-CM

## 2019-09-13 DIAGNOSIS — D500 Iron deficiency anemia secondary to blood loss (chronic): Secondary | ICD-10-CM

## 2019-09-13 LAB — CBC WITH DIFFERENTIAL/PLATELET
Abs Immature Granulocytes: 0.09 10*3/uL — ABNORMAL HIGH (ref 0.00–0.07)
Basophils Absolute: 0 10*3/uL (ref 0.0–0.1)
Basophils Relative: 0 %
Eosinophils Absolute: 0.1 10*3/uL (ref 0.0–0.5)
Eosinophils Relative: 1 %
HCT: 37 % (ref 36.0–46.0)
Hemoglobin: 12.1 g/dL (ref 12.0–15.0)
Immature Granulocytes: 2 %
Lymphocytes Relative: 39 %
Lymphs Abs: 1.8 10*3/uL (ref 0.7–4.0)
MCH: 30 pg (ref 26.0–34.0)
MCHC: 32.7 g/dL (ref 30.0–36.0)
MCV: 91.6 fL (ref 80.0–100.0)
Monocytes Absolute: 0.8 10*3/uL (ref 0.1–1.0)
Monocytes Relative: 17 %
Neutro Abs: 1.8 10*3/uL (ref 1.7–7.7)
Neutrophils Relative %: 41 %
Platelets: 276 10*3/uL (ref 150–400)
RBC: 4.04 MIL/uL (ref 3.87–5.11)
RDW: 17.9 % — ABNORMAL HIGH (ref 11.5–15.5)
WBC: 4.6 10*3/uL (ref 4.0–10.5)
nRBC: 0 % (ref 0.0–0.2)

## 2019-09-13 MED ORDER — DEXTROSE 5 % IV SOLN
2.0000 g | Freq: Once | INTRAVENOUS | Status: AC
  Administered 2019-09-13: 2 g via INTRAVENOUS
  Filled 2019-09-13: qty 20

## 2019-09-13 MED ORDER — HEPARIN SOD (PORK) LOCK FLUSH 100 UNIT/ML IV SOLN
500.0000 [IU] | Freq: Once | INTRAVENOUS | Status: AC
  Administered 2019-09-13: 500 [IU]
  Filled 2019-09-13: qty 5

## 2019-09-13 MED ORDER — SODIUM CHLORIDE 0.9% FLUSH
10.0000 mL | Freq: Once | INTRAVENOUS | Status: AC
  Administered 2019-09-13: 11:00:00 10 mL
  Filled 2019-09-13: qty 10

## 2019-09-13 MED ORDER — SODIUM CHLORIDE 0.9 % IV SOLN
Freq: Once | INTRAVENOUS | Status: AC
  Filled 2019-09-13: qty 250

## 2019-09-13 MED ORDER — LEVOFLOXACIN 750 MG PO TABS: 750.0000 mg | ORAL_TABLET | Freq: Every day | ORAL | 0 refills | Status: DC

## 2019-09-13 MED FILL — levoFLOXacin 750 MG TABS: 750 | 14 days supply | Qty: 14 | Fill #0

## 2019-09-13 NOTE — Progress Notes (Signed)
Pt received IV Rocephin today, tolerated well.  VSS.  Denies any questions or concerns at time of d/c, aware to f/u as needed and to have site rechecked during next appt by either MD Irene Limbo or PA Lucianne Lei.

## 2019-09-13 NOTE — Progress Notes (Signed)
[facility] Progress Note   Elizabeth Beasley [id number] Jun 24, 1981 38 y.o.  Elizabeth Beasley is managed by Dr. Sullivan Lone  Actively treated with chemotherapy/immunotherapy/hormonal therapy: yes  Current therapy: ABVD with Neulasta support  Next scheduled appointment with provider: 09/20/2019  Assessment: Plan:    Abscess   Nodular sclerosing Hodgkin's lymphoma: The patient continues to be managed by Dr. Sullivan Lone and is status post treatment with ABVD with Neulasta support.  She is scheduled to return on 09/20/2019.  Recurrent abscess of the right axilla: [name] was dosed with Rocephin 2 grams IV x 1. She was told to stop Bactrim and was transitioned to Levaquin 750 mg once daily x 14 days.   She was told to use a heating pad or warm moist cloth to the area multiple times daily.  She was told to have Dr. Irene Limbo look at the area at her upcoming visit.  Please see After Visit Summary for patient specific instructions.  Future Appointments  Date Time Provider [place]  09/20/2019 11:00 AM [facility] ONLY [facility] None  09/20/2019 11:15 AM [facility] [place] FLUSH [facility] None  09/20/2019 11:40 AM Brunetta Genera, MD [facility] None  09/20/2019 12:30 PM [facility] INFUSION [facility] None    No orders of the defined types were placed in this encounter.      Subjective:   Patient ID:  Elizabeth Beasley is a 38 y.o. (DOB 11/23/1980) female.  Chief Complaint: No chief complaint on file.   HPI Elizabeth Beasley  Is a 38 y.o. female with a diagnosis of a nodular sclerosing Hodgkin's lymphoma. She is managed by Dr. Sullivan Lone and is status post  ABVD with Neulasta support.  She is to be seen again on 09/20/2019. [name] has a history of a right axillary abscess and is currently on Bactrim. Despite this, she continues to have a painful indurated area in the right axilla. There was drainage of yellow pus from the area over the  weekend. She denies fevers, chills, or sweats.   Medications: I have reviewed the patient's current medications.  Allergies: No Known Allergies  Past Medical History:  Diagnosis Date  . Anemia   . Bartholin gland cyst   . Hodgkin's lymphoma, nodular sclerosis ([place]) 06/08/2019  . Hx of seasonal allergies   . Lymphadenopathy, cervical 05/21/2019  . PONV (postoperative nausea and vomiting)   . 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 CESAREAN SECTION  2010  . MASS EXCISION Right 05/21/2019   Procedure: EXCISION OF RIGHT DEEP NECK LYMPH NODE;  Surgeon: Fanny Skates, MD;  Location: [facility];  Service: General;  Laterality: Right;  . PORTACATH PLACEMENT Right 06/08/2019   Procedure: INSERTION PORT-A-CATH WITH ULTRASOUND GUIDANCE;  Surgeon: Fanny Skates, MD;  Location: [place] [facility];  Service: General;  Laterality: Right;  . URETHRAL CYST REMOVAL N/A 08/30/2016   Procedure: BARTHOLIN'S GLAND CYST EXCISION;  Surgeon: Ardis Hughs, MD;  Location: [facility];  Service: Urology;  Laterality: N/A;  . v back     2012    Family History  Problem Relation Age of Onset  . Colon cancer Father 52  . Colon polyps Mother   . Hypertension Mother   . Diabetes Paternal Grandmother   . Stroke Paternal Grandmother   . Alcohol abuse Paternal Uncle   . Colon cancer Paternal Aunt 65  .  Esophageal cancer Neg Hx   . Rectal cancer Neg Hx   . Stomach cancer Neg Hx     Social History   Socioeconomic History  . Marital status: Married    Spouse name: Montine Circle  . Number of children: 2  . Years of education: Assoc  . Highest education level: Not on file  Occupational History  . Occupation: CMA    Employer: [employer]  Tobacco Use  . Smoking status: Never Smoker  . Smokeless tobacco: Never Used  Substance and Sexual Activity  . Alcohol use: Not Currently    Comment: ocassional  . Drug  use: No  . Sexual activity: Yes    Partners: Male    Birth control/protection: I.U.D.  Other Topics Concern  . Not on file  Social History Narrative   Regular exercise. No regular caffeine.   Social Determinants of Health   Financial Resource Strain:   . Difficulty of Paying Living Expenses: Not on file  Food Insecurity:   . Worried About Charity fundraiser in the Last Year: Not on file  . Ran Out of Food in the Last Year: Not on file  Transportation Needs:   . Lack of Transportation (Medical): Not on file  . Lack of Transportation (Non-Medical): Not on file  Physical Activity:   . Days of Exercise per Week: Not on file  . Minutes of Exercise per Session: Not on file  Stress:   . Feeling of Stress : Not on file  Social Connections:   . Frequency of Communication with Friends and Family: Not on file  . Frequency of Social Gatherings with Friends and Family: Not on file  . Attends Religious Services: Not on file  . Active Member of Clubs or Organizations: Not on file  . Attends Archivist Meetings: Not on file  . Marital Status: Not on file  Intimate Partner Violence:   . Fear of Current or Ex-Partner: Not on file  . Emotionally Abused: Not on file  . Physically Abused: Not on file  . Sexually Abused: Not on file    Past Medical History, Surgical history, Social history, and Family history were reviewed and updated as appropriate.   Please see review of systems for further details on the patient's review from today.   Review of Systems:  Review of Systems  Constitutional: Negative for chills, diaphoresis and fever.  Skin: Negative for rash and wound.       Swollen, tender, warm right axillary mass.    Objective:   Physical Exam:  BP 106/65 (BP Location: Left Arm, Patient Position: Sitting)   Pulse 91   Temp 98 F (36.7 C) (Temporal)   Resp 17   Ht 5\' 4"  (1.626 m)   Wt 203 lb 8 oz (92.3 kg)   SpO2 100%   BMI 34.93 kg/m  ECOG: 0  Physical  Exam Constitutional:      General: She is not in acute distress.    Appearance: Normal appearance. She is not ill-appearing, toxic-appearing or diaphoretic.  HENT:     Head: Normocephalic and atraumatic.  Skin:    General: Skin is warm and dry.     Comments: 5 x 3 cm indurated, tender, erythematous mass in the  right axillary mass.     Neurological:     Mental Status: She is alert.     Coordination: Coordination normal.     Gait: Gait normal.  Psychiatric:        Mood and Affect:  Mood normal.        Behavior: Behavior normal.        Thought Content: Thought content normal.        Judgment: Judgment normal.       Lab Review:     Component Value Date/Time   NA 142 09/02/2019 1109   K 3.5 09/02/2019 1109   CL 104 09/02/2019 1109   CO2 25 09/02/2019 1109   GLUCOSE 124 (H) 09/02/2019 1109   BUN 12 09/02/2019 1109   CREATININE 0.79 09/02/2019 1109   CREATININE 0.66 05/14/2019 1529   CALCIUM 8.9 09/02/2019 1109   PROT 6.8 09/02/2019 1109   ALBUMIN 3.5 09/02/2019 1109   AST 13 (L) 09/02/2019 1109   ALT 18 09/02/2019 1109   ALKPHOS 56 09/02/2019 1109   BILITOT 0.3 09/02/2019 1109   GFRNONAA >60 09/02/2019 1109   GFRNONAA 113 05/14/2019 1529   GFRAA >60 09/02/2019 1109   GFRAA 131 05/14/2019 1529       Component Value Date/Time   WBC 4.2 09/02/2019 1109   RBC 4.10 09/02/2019 1109   HGB 12.4 09/02/2019 1109   HGB 11.3 (L) 07/09/2019 1009   HCT 36.8 09/02/2019 1109   PLT 266 09/02/2019 1109   PLT 265 07/09/2019 1009   MCV 89.8 09/02/2019 1109   MCH 30.2 09/02/2019 1109   MCHC 33.7 09/02/2019 1109   RDW 18.9 (H) 09/02/2019 1109   LYMPHSABS 1.8 09/02/2019 1109   MONOABS 1.1 (H) 09/02/2019 1109   EOSABS 0.1 09/02/2019 1109   BASOSABS 0.0 09/02/2019 1109   -------------------------------  Imaging from last 24 hours (if applicable):  Radiology interpretation: No results found.

## 2019-09-13 NOTE — Patient Instructions (Signed)
COVID-19: How to Protect Yourself and Others Know how it spreads  There is currently no vaccine to prevent coronavirus disease 2019 (COVID-19).  The best way to prevent illness is to avoid being exposed to this virus.  The virus is thought to spread mainly from person-to-person. ? Between people who are in close contact with one another (within about 6 feet). ? Through respiratory droplets produced when an infected person coughs, sneezes or talks. ? These droplets can land in the mouths or noses of people who are nearby or possibly be inhaled into the lungs. ? Some recent studies have suggested that COVID-19 may be spread by people who are not showing symptoms. Everyone should Clean your hands often  Wash your hands often with soap and water for at least 20 seconds especially after you have been in a public place, or after blowing your nose, coughing, or sneezing.  If soap and water are not readily available, use a hand sanitizer that contains at least 60% alcohol. Cover all surfaces of your hands and rub them together until they feel dry.  Avoid touching your eyes, nose, and mouth with unwashed hands. Avoid close contact  Stay home if you are sick.  Avoid close contact with people who are sick.  Put distance between yourself and other people. ? Remember that some people without symptoms may be able to spread virus. ? This is especially important for people who are at higher risk of getting very sick.www.cdc.gov/coronavirus/2019-ncov/need-extra-precautions/people-at-higher-risk.html Cover your mouth and nose with a cloth face cover when around others  You could spread COVID-19 to others even if you do not feel sick.  Everyone should wear a cloth face cover when they have to go out in public, for example to the grocery store or to pick up other necessities. ? Cloth face coverings should not be placed on young children under age 2, anyone who has trouble breathing, or is unconscious,  incapacitated or otherwise unable to remove the mask without assistance.  The cloth face cover is meant to protect other people in case you are infected.  Do NOT use a facemask meant for a healthcare worker.  Continue to keep about 6 feet between yourself and others. The cloth face cover is not a substitute for social distancing. Cover coughs and sneezes  If you are in a private setting and do not have on your cloth face covering, remember to always cover your mouth and nose with a tissue when you cough or sneeze or use the inside of your elbow.  Throw used tissues in the trash.  Immediately wash your hands with soap and water for at least 20 seconds. If soap and water are not readily available, clean your hands with a hand sanitizer that contains at least 60% alcohol. Clean and disinfect  Clean AND disinfect frequently touched surfaces daily. This includes tables, doorknobs, light switches, countertops, handles, desks, phones, keyboards, toilets, faucets, and sinks. www.cdc.gov/coronavirus/2019-ncov/prevent-getting-sick/disinfecting-your-home.html  If surfaces are dirty, clean them: Use detergent or soap and water prior to disinfection.  Then, use a household disinfectant. You can see a list of EPA-registered household disinfectants here. cdc.gov/coronavirus 01/19/2019 This information is not intended to replace advice given to you by your health care provider. Make sure you discuss any questions you have with your health care provider. Document Released: 12/29/2018 Document Revised: 01/27/2019 Document Reviewed: 12/29/2018 Elsevier Patient Education  2020 [employer]  

## 2019-09-16 ENCOUNTER — Other Ambulatory Visit: Payer: No Typology Code available for payment source

## 2019-09-16 ENCOUNTER — Ambulatory Visit: Payer: No Typology Code available for payment source

## 2019-09-16 ENCOUNTER — Telehealth: Payer: Self-pay | Admitting: *Deleted

## 2019-09-16 ENCOUNTER — Other Ambulatory Visit: Payer: Self-pay | Admitting: *Deleted

## 2019-09-16 NOTE — Telephone Encounter (Signed)
Patient called to ask if she should start oral iron since she has noticed slight changes in her hgb and cannot receive blood due due to religious reasons. Reviewed recent lab results with patient, noted that hgb from 12/28 unchanged from 11/19. Advised her that her hgb is within normal limits and recommended she discuss with Dr. Irene Limbo during appt on Monday. She verbalized understanding.

## 2019-09-20 ENCOUNTER — Inpatient Hospital Stay: Payer: No Typology Code available for payment source

## 2019-09-20 ENCOUNTER — Other Ambulatory Visit: Payer: Self-pay

## 2019-09-20 ENCOUNTER — Inpatient Hospital Stay: Payer: No Typology Code available for payment source | Attending: Hematology | Admitting: Hematology

## 2019-09-20 VITALS — BP 117/63 | HR 95 | Temp 98.2°F | Resp 18 | Ht 64.0 in | Wt 204.0 lb

## 2019-09-20 DIAGNOSIS — C8118 Nodular sclerosis classical Hodgkin lymphoma, lymph nodes of multiple sites: Secondary | ICD-10-CM | POA: Diagnosis not present

## 2019-09-20 DIAGNOSIS — R61 Generalized hyperhidrosis: Secondary | ICD-10-CM | POA: Diagnosis not present

## 2019-09-20 DIAGNOSIS — Z5111 Encounter for antineoplastic chemotherapy: Secondary | ICD-10-CM | POA: Diagnosis not present

## 2019-09-20 DIAGNOSIS — L02411 Cutaneous abscess of right axilla: Secondary | ICD-10-CM | POA: Insufficient documentation

## 2019-09-20 DIAGNOSIS — C8110 Nodular sclerosis classical Hodgkin lymphoma, unspecified site: Secondary | ICD-10-CM | POA: Diagnosis not present

## 2019-09-20 DIAGNOSIS — C8111 Nodular sclerosis classical Hodgkin lymphoma, lymph nodes of head, face, and neck: Secondary | ICD-10-CM

## 2019-09-20 DIAGNOSIS — Z7952 Long term (current) use of systemic steroids: Secondary | ICD-10-CM | POA: Insufficient documentation

## 2019-09-20 DIAGNOSIS — R5383 Other fatigue: Secondary | ICD-10-CM | POA: Insufficient documentation

## 2019-09-20 DIAGNOSIS — Z95828 Presence of other vascular implants and grafts: Secondary | ICD-10-CM

## 2019-09-20 DIAGNOSIS — Z7189 Other specified counseling: Secondary | ICD-10-CM

## 2019-09-20 DIAGNOSIS — D638 Anemia in other chronic diseases classified elsewhere: Secondary | ICD-10-CM | POA: Diagnosis not present

## 2019-09-20 DIAGNOSIS — D500 Iron deficiency anemia secondary to blood loss (chronic): Secondary | ICD-10-CM

## 2019-09-20 DIAGNOSIS — Z79899 Other long term (current) drug therapy: Secondary | ICD-10-CM | POA: Diagnosis not present

## 2019-09-20 DIAGNOSIS — Z7689 Persons encountering health services in other specified circumstances: Secondary | ICD-10-CM | POA: Diagnosis not present

## 2019-09-20 LAB — CMP (CANCER CENTER ONLY)
ALT: 23 U/L (ref 0–44)
AST: 17 U/L (ref 15–41)
Albumin: 3.9 g/dL (ref 3.5–5.0)
Alkaline Phosphatase: 40 U/L (ref 38–126)
Anion gap: 10 (ref 5–15)
BUN: 10 mg/dL (ref 6–20)
CO2: 25 mmol/L (ref 22–32)
Calcium: 9.4 mg/dL (ref 8.9–10.3)
Chloride: 105 mmol/L (ref 98–111)
Creatinine: 0.67 mg/dL (ref 0.44–1.00)
GFR, Est AFR Am: >60 mL/min (ref >60)
GFR, Estimated: >60 mL/min (ref >60)
Glucose, Bld: 87 mg/dL (ref 70–99)
Potassium: 3.7 mmol/L (ref 3.5–5.1)
Sodium: 140 mmol/L (ref 135–145)
Total Bilirubin: 0.8 mg/dL (ref 0.3–1.2)
Total Protein: 6.8 g/dL (ref 6.5–8.1)

## 2019-09-20 LAB — CBC WITH DIFFERENTIAL/PLATELET
Abs Immature Granulocytes: 0.03 10*3/uL (ref 0.00–0.07)
Basophils Absolute: 0 10*3/uL (ref 0.0–0.1)
Basophils Relative: 1 %
Eosinophils Absolute: 0.1 10*3/uL (ref 0.0–0.5)
Eosinophils Relative: 1 %
HCT: 38.4 % (ref 36.0–46.0)
Hemoglobin: 12.8 g/dL (ref 12.0–15.0)
Immature Granulocytes: 0 %
Lymphocytes Relative: 30 %
Lymphs Abs: 2.4 10*3/uL (ref 0.7–4.0)
MCH: 30.6 pg (ref 26.0–34.0)
MCHC: 33.3 g/dL (ref 30.0–36.0)
MCV: 91.9 fL (ref 80.0–100.0)
Monocytes Absolute: 0.6 10*3/uL (ref 0.1–1.0)
Monocytes Relative: 8 %
Neutro Abs: 4.9 10*3/uL (ref 1.7–7.7)
Neutrophils Relative %: 60 %
Platelets: 282 10*3/uL (ref 150–400)
RBC: 4.18 MIL/uL (ref 3.87–5.11)
RDW: 17.3 % — ABNORMAL HIGH (ref 11.5–15.5)
WBC: 8.1 10*3/uL (ref 4.0–10.5)
nRBC: 0 % (ref 0.0–0.2)

## 2019-09-20 MED ORDER — SODIUM CHLORIDE 0.9 % IV SOLN
Freq: Once | INTRAVENOUS | Status: AC
  Filled 2019-09-20: qty 250

## 2019-09-20 MED ORDER — SODIUM CHLORIDE 0.9 % IV SOLN
375.0000 mg/m2 | Freq: Once | INTRAVENOUS | Status: AC
  Administered 2019-09-20: 750 mg via INTRAVENOUS
  Filled 2019-09-20: qty 75

## 2019-09-20 MED ORDER — SODIUM CHLORIDE 0.9% FLUSH
10.0000 mL | Freq: Once | INTRAVENOUS | Status: AC
  Administered 2019-09-20: 10 mL
  Filled 2019-09-20: qty 10

## 2019-09-20 MED ORDER — VINBLASTINE SULFATE CHEMO INJECTION 1 MG/ML
6.0000 mg/m2 | Freq: Once | INTRAVENOUS | Status: AC
  Administered 2019-09-20: 12 mg via INTRAVENOUS
  Filled 2019-09-20: qty 12

## 2019-09-20 MED ORDER — DOXORUBICIN HCL CHEMO IV INJECTION 2 MG/ML
25.0000 mg/m2 | Freq: Once | INTRAVENOUS | Status: AC
  Administered 2019-09-20: 14:00:00 50 mg via INTRAVENOUS
  Filled 2019-09-20: qty 25

## 2019-09-20 MED ORDER — LORAZEPAM 2 MG/ML IJ SOLN
INTRAMUSCULAR | Status: AC
  Filled 2019-09-20: qty 1

## 2019-09-20 MED ORDER — PALONOSETRON HCL INJECTION 0.25 MG/5ML
INTRAVENOUS | Status: AC
  Filled 2019-09-20: qty 5

## 2019-09-20 MED ORDER — DEXAMETHASONE 4 MG PO TABS: ORAL_TABLET | ORAL | 1 refills | Status: DC

## 2019-09-20 MED ORDER — LORAZEPAM 2 MG/ML IJ SOLN
0.5000 mg | Freq: Once | INTRAMUSCULAR | Status: AC
  Administered 2019-09-20: 0.5 mg via INTRAVENOUS

## 2019-09-20 MED ORDER — SODIUM CHLORIDE 0.9% FLUSH
10.0000 mL | INTRAVENOUS | Status: DC | PRN
  Administered 2019-09-20: 10 mL
  Filled 2019-09-20: qty 10

## 2019-09-20 MED ORDER — HEPARIN SOD (PORK) LOCK FLUSH 100 UNIT/ML IV SOLN
500.0000 [IU] | Freq: Once | INTRAVENOUS | Status: AC | PRN
  Administered 2019-09-20: 500 [IU]
  Filled 2019-09-20: qty 5

## 2019-09-20 MED ORDER — SODIUM CHLORIDE 0.9 % IV SOLN
Freq: Once | INTRAVENOUS | Status: AC
  Filled 2019-09-20: qty 5

## 2019-09-20 MED ORDER — PALONOSETRON HCL INJECTION 0.25 MG/5ML
0.2500 mg | Freq: Once | INTRAVENOUS | Status: AC
  Administered 2019-09-20: 0.25 mg via INTRAVENOUS

## 2019-09-20 MED FILL — DEXAMETHASONE 4 MG TABLET: 4 | 8 days supply | Qty: 30 | Fill #0

## 2019-09-20 NOTE — Progress Notes (Signed)
HEMATOLOGY/ONCOLOGY CLINIC NOTE  Date of Service: 09/20/19   Patient Care Team: [name], MD as PCP - General (Family Medicine)  CHIEF COMPLAINTS/PURPOSE OF CONSULTATION:   Continue management of classical hodgkins lymphoma   CURRENT TREATMENT: Cycle 4 day 1 with granix support  HISTORY OF PRESENTING ILLNESS:   Elizabeth Beasley is a wonderful 39 y.o. female who has been referred to [place] by Dr Madilyn Fireman for evaluation and management of microcytic anemia/cervical lymphadenopathy.The pt reports that she has a history of iron deficiency but has never had a transfusion. Pt has had two pregnancies and has two children, ages 61 and 31. There was a concern for anemia with her second pregnancy so she was given an erythropoietin shot. She has had a copper IUD for about 2 years and her menstrual cycle has been slightly longer since getting the IUD. She has been taking 28 mg TID PO iron since June. Pt has no known medication allergies and has had a previous removal of Bartholin's gland cyst. Pt has had no known contact with chemicals either through work or hobbies. She has no history of asthma or heavy smoke/aerosol exposure.   Pt did not notice any cervical lymphadenopathy in June. In late July, early August she began to experience extreme fatigue and could begin to feel small lumps in her neck. Her fatigue is improved with taking her PO Iron TID. She has experienced a couple of episodes of night sweats, with the latest occurring in late August. Pt has also had <10 lbs loss in 2 months. She has not had any rashes but is experiencing itching in her palms, broader itching after hot showers.   Pt is currently working from home. She has had her seasonal flu shot. Pt is not planning on having more children and is not concerned about fertility. Pt's husband would like to take a drive to the [facility] next month. Pt would not want any blood product transfusions, as she is a Jehovah's  Witness. She is comfortable with IV iron infusions.   INTERVAL HISTORY:  Elizabeth Beasley is a 39 y.o. female here for evaluation and management of hodgkins lymphoma. The patient's last visit with [place] was on 09/02/2019. The pt reports that she is doing well overall.  The pt reports that her abscess was improving, but has completely stopped draining and still has some fluctuation. She feels that her abscess has been minimized since beginning Levofloxacin, which was prescribed by Sandi Mealy. Pt has 5 more days of Levofloxacin left.  She has been having whole body aches, especially in her legs, shoulders, and hips. The ache is tolerable at this time. She is interested in receiving the Covid-19 vaccine when possible.   Lab results today (09/20/19) of CBC w/diff and CMP is as follows: all values are WNL except for RDW at 17.3.   On review of systems, pt reports whole body aches and denies hand swelling, fever, chills, night sweats, diarrhea, abdominal pain, leg swelling and any other symptoms.   MEDICAL HISTORY:  Past Medical History:  Diagnosis Date  . Anemia   . Bartholin gland cyst   . 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 Hx of seasonal allergies   . Lymphadenopathy, cervical 05/21/2019  . PONV (postoperative nausea and vomiting)   . 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 CESAREAN SECTION  2010  . MASS EXCISION Right 05/21/2019   Procedure: EXCISION OF RIGHT DEEP NECK LYMPH NODE;  Surgeon: Fanny Skates, MD;  Location: [facility];  Service: General;  Laterality: Right;  . PORTACATH PLACEMENT Right 06/08/2019   Procedure: INSERTION PORT-A-CATH WITH ULTRASOUND GUIDANCE;  Surgeon: Fanny Skates, MD;  Location: [facility];  Service: General;  Laterality: Right;  . URETHRAL CYST REMOVAL N/A 08/30/2016   Procedure: BARTHOLIN'S GLAND CYST EXCISION;  Surgeon:  Ardis Hughs, MD;  [facility];  Service: Urology;  Laterality: N/A;  . v back     2012    SOCIAL HISTORY: Social History   Socioeconomic History  . Marital status: Married    Spouse name: Montine Circle  . Number of children: 2  . Years of education: Assoc  . Highest education level: Not on file  Occupational History  . Occupation: CMA    Employer: [employer]  Tobacco Use  . Smoking status: Never Smoker  . Smokeless tobacco: Never Used  Substance and Sexual Activity  . Alcohol use: Not Currently    Comment: ocassional  . Drug use: No  . Sexual activity: Yes    Partners: Male    Birth control/protection: I.U.D.  Other Topics Concern  . Not on file  Social History Narrative   Regular exercise. No regular caffeine.   Social Determinants of Health   Financial Resource Strain:   . Difficulty of Paying Living Expenses: Not on file  Food Insecurity:   . Worried About Charity fundraiser in the Last Year: Not on file  . Ran Out of Food in the Last Year: Not on file  Transportation Needs:   . Lack of Transportation (Medical): Not on file  . Lack of Transportation (Non-Medical): Not on file  Physical Activity:   . Days of Exercise per Week: Not on file  . Minutes of Exercise per Session: Not on file  Stress:   . Feeling of Stress : Not on file  Social Connections:   . Frequency of Communication with Friends and Family: Not on file  . Frequency of Social Gatherings with Friends and Family: Not on file  . Attends Religious Services: Not on file  . Active Member of Clubs or Organizations: Not on file  . Attends Archivist Meetings: Not on file  . Marital Status: Not on file  Intimate Partner Violence:   . Fear of Current or Ex-Partner: Not on file  . Emotionally Abused: Not on file  . Physically Abused: Not on file  . Sexually Abused: Not on file    FAMILY HISTORY: Family History  Problem Relation Age of Onset  . Colon cancer Father  53  . Colon polyps Mother   . Hypertension Mother   . Diabetes Paternal Grandmother   . Stroke Paternal Grandmother   . Alcohol abuse Paternal Uncle   . Colon cancer Paternal Aunt 44  . Esophageal cancer Neg Hx   . Rectal cancer Neg Hx   . Stomach cancer Neg Hx    On PMHx the pt reports C-section, Bartholin's Gland Cyst removal. On Social Hx the pt reports non smoker, no alcohol use. On Family Hx the pt reports father passed from colon cancer, mother has colon polyps.  ALLERGIES:  has No Known Allergies.  MEDICATIONS:  Current Outpatient Medications  Medication Sig Dispense Refill  . b complex vitamins capsule Take 1 capsule by mouth daily.    . cholecalciferol (VITAMIN D3) 25 MCG (1000  UT) tablet Take 2,000 Units by mouth daily.    Marland Kitchen dexamethasone (DECADRON) 4 MG tablet Take 2 tablets by mouth once a day on the day after chemotherapy and then take 2 tablets two times a day for 2 days. Take with food. 30 tablet 1  . fexofenadine (ALLEGRA) 180 MG tablet Take 180 mg by mouth daily as needed for allergies or rhinitis.    . fluconazole (DIFLUCAN) 150 MG tablet Take 1 tablet for a yeast infection if needed, may repeat in 3 days if needed 2 tablet 0  . levofloxacin (LEVAQUIN) 750 MG tablet Take 1 tablet (750 mg total) by mouth daily. 14 tablet 0  . LORazepam (ATIVAN) 0.5 MG tablet Take 1 tablet (0.5 mg total) by mouth every 6 (six) hours as needed (Nausea or vomiting). 30 tablet 0  . mupirocin ointment (BACTROBAN) 2 % Place 1 application into the nose 2 (two) times daily. 22 g 1  . ondansetron (ZOFRAN) 8 MG tablet Take 1 tablet (8 mg total) by mouth 2 (two) times daily as needed. Start on the third day after chemotherapy. 30 tablet 1  . potassium chloride SA (KLOR-CON) 20 MEQ tablet Take 1 tablet (20 mEq total) by mouth 2 (two) times daily. 60 tablet 1  . prochlorperazine (COMPAZINE) 10 MG tablet Take 1 tablet (10 mg total) by mouth every 6 (six) hours as needed (Nausea or vomiting). 30  tablet 1  . sulfamethoxazole-trimethoprim (BACTRIM DS) 800-160 MG tablet Take 1 tablet by mouth 2 (two) times daily. 14 tablet 0   No current facility-administered medications for this visit.   Facility-Administered Medications Ordered in Other Visits  Medication Dose Route Frequency Provider Last Rate Last Admin  . dacarbazine (DTIC) 750 mg in sodium chloride 0.9 % 250 mL chemo infusion  375 mg/m2 (Treatment Plan Recorded) Intravenous Once Brunetta Genera, MD      . DOXOrubicin (ADRIAMYCIN) chemo injection 50 mg  25 mg/m2 (Treatment Plan Recorded) Intravenous Once Brunetta Genera, MD      . fosaprepitant (EMEND) 150 mg, dexamethasone (DECADRON) 12 mg in sodium chloride 0.9 % 145 mL IVPB   Intravenous Once Brunetta Genera, MD 454 mL/hr at 09/20/19 1335 New Bag at 09/20/19 1335  . heparin lock flush 100 unit/mL  500 Units Intracatheter Once PRN Brunetta Genera, MD      . sodium chloride flush (NS) 0.9 % injection 10 mL  10 mL Intracatheter PRN Brunetta Genera, MD   10 mL at 08/05/19 1658  . sodium chloride flush (NS) 0.9 % injection 10 mL  10 mL Intracatheter PRN Brunetta Genera, MD      . vinBLAStine (VELBAN) 12 mg in sodium chloride 0.9 % 50 mL chemo infusion  6 mg/m2 (Treatment Plan Recorded) Intravenous Once Brunetta Genera, MD        REVIEW OF SYSTEMS:   A 10+ POINT REVIEW OF SYSTEMS WAS OBTAINED including neurology, dermatology, psychiatry, cardiac, respiratory, lymph, extremities, GI, GU, Musculoskeletal, constitutional, breasts, reproductive, HEENT.  All pertinent positives are noted in the HPI.  All others are negative.   PHYSICAL EXAMINATION: ECOG FS:1 - Symptomatic but completely ambulatory  Vitals:   09/20/19 1158  BP: 117/63  Pulse: 95  Resp: 18  Temp: 98.2 F (36.8 C)  SpO2: 100%   Wt Readings from Last 3 Encounters:  09/20/19 204 lb (92.5 kg)  09/13/19 203 lb 8 oz (92.3 kg)  09/02/19 201 lb 6.4 oz (91.4 kg)   Body mass index  is  35.02 kg/m.    GENERAL:alert, in no acute distress and comfortable, right axillary abscess with fluctuation SKIN: no acute rashes, no significant lesions EYES: conjunctiva are pink and non-injected, sclera anicteric OROPHARYNX: MMM, no exudates, no oropharyngeal erythema or ulceration NECK: supple, no JVD LYMPH:  no palpable lymphadenopathy in the cervical, axillary or inguinal regions LUNGS: clear to auscultation b/l with normal respiratory effort HEART: regular rate & rhythm ABDOMEN:  normoactive bowel sounds , non tender, not distended. No palpable hepatosplenomegaly.  Extremity: no pedal edema PSYCH: alert & oriented x 3 with fluent speech NEURO: no focal motor/sensory deficits  LABORATORY DATA:  I have reviewed the data as listed  - 05/14/2019 Fe+TIBC+Fer is as follows: Iron at 11, TIBC at 258, % Sat at 4, Ferritin at 50. -Discussed 05/21/2019 Lymph Node Bx [id number]) which revealed " CLASSIC HODGKIN LYMPHOMA."  - 05/19/2019 CT C/A/P ([id number]) which revealed  "1. Enlarged mediastinal, bilateral hilar and lower cervical lymph nodes. Primary considerations include lymphoma, sarcoid and less likely metastatic disease. Normal spleen. No evidence of enlarged lymph nodes within the abdomen or pelvis. 2. Low lying IUD within the LOWER uterine segment and may extend to the cervix."  - 05/17/2019 CT Soft Tissue Neck w/contrast ([id number]) which revealed "Bulky right greater than left cervical and mediastinal lymphadenopathy concerning for lymphoma."  . CBC Latest Ref Rng & Units 09/20/2019 09/13/2019 09/02/2019  WBC 4.0 - 10.5 K/uL 8.1 4.6 4.2  Hemoglobin 12.0 - 15.0 g/dL 12.8 12.1 12.4  Hematocrit 36.0 - 46.0 % 38.4 37.0 36.8  Platelets 150 - 400 K/uL 282 276 266    . CMP Latest Ref Rng & Units 09/20/2019 09/02/2019 08/20/2019  Glucose 70 - 99 mg/dL 87 124(H) 88  BUN 6 - 20 mg/dL 10 12 7   Creatinine 0.44 - 1.00 mg/dL 0.67 0.79 0.69  Sodium 135 - 145 mmol/L 140 142 143  Potassium  3.5 - 5.1 mmol/L 3.7 3.5 3.5  Chloride 98 - 111 mmol/L 105 104 107  CO2 22 - 32 mmol/L 25 25 28   Calcium 8.9 - 10.3 mg/dL 9.4 8.9 9.1  Total Protein 6.5 - 8.1 g/dL 6.8 6.8 6.7  Total Bilirubin 0.3 - 1.2 mg/dL 0.8 0.3 0.5  Alkaline Phos 38 - 126 U/L 40 56 56  AST 15 - 41 U/L 17 13(L) 16  ALT 0 - 44 U/L 23 18 20    . Lab Results  Component Value Date   IRON 79 07/09/2019   TIBC 221 (L) 07/09/2019   IRONPCTSAT 36 07/09/2019   (Iron and TIBC)  Lab Results  Component Value Date   FERRITIN 590 (H) 07/09/2019  08/13/2019 NM PET Image Restag (PS) Skull Base To Thigh (Accession [id number])   05/21/2019 [id number]) Lymph Node Biopsy    RADIOGRAPHIC STUDIES: I have personally reviewed the radiological images as listed and agreed with the findings in the report. No results found.  ASSESSMENT & PLAN:   1) Recently diagnosed Classical Hodgkins lymphoma Likely stage IIIB based on imaging thus far. 2) Microcytic Anemia due to anemia of chronic disease and Iron deficiency- hg improved to 11.3 3) Jehovah's Witness   PLAN: -Discussed pt labwork today, 09/20/19; blood counts and chemistries are good -Recommend pt eat live cultured yogurt to improve gut health given use of antibiotics -Recommend pt to receive Covid-19 vaccine when availalble -Will refer pt to [facility] for surgical evaluation of right axillary abscess -The pt has no prohibitive toxicities from continuing with C4D1 of ABVD at this  time -Continue Granix after every treatment -Refill Dexamethasone -Will see back in 2 weeks with labs  FOLLOW UP: -Urgent referral to general surgery ([place] [facility]) for mx of rt axillary abscess in patient on chemotherapy for hodgkins lymphoma. -Please schedule next 2 treatments every 2 weeks with port flush, labs and MD visit   The total time spent in the appt was 25 minutes and more than 50% was on counseling and direct patient cares.  All of the patient's  questions were answered with apparent satisfaction. The patient knows to call the clinic with any problems, questions or concerns.   Sullivan Lone MD Opp AAHIVMS [facility] Hematology/Oncology Physician [facility]  (Office):       [phone] (Work cell):  [phone] (Fax):           [phone]  09/20/2019 1:39 PM  I, Yevette Edwards, am acting as a scribe for Dr. Sullivan Lone.   .I have reviewed the above documentation for accuracy and completeness, and I agree with the above. Brunetta Genera MD

## 2019-09-20 NOTE — Patient Instructions (Signed)
[facility] Discharge Instructions for Patients Receiving Chemotherapy   Today you received the following chemotherapy agents:Adriamycin, Venblastine, Dacarbazine  To help prevent nausea and vomiting after your treatment, we encourage you to take your nausea medication as directed.    If you develop nausea and vomiting that is not controlled by your nausea medication, call the clinic.   BELOW ARE SYMPTOMS THAT SHOULD BE REPORTED IMMEDIATELY:  *FEVER GREATER THAN 100.5 F  *CHILLS WITH OR WITHOUT FEVER  NAUSEA AND VOMITING THAT IS NOT CONTROLLED WITH YOUR NAUSEA MEDICATION  *UNUSUAL SHORTNESS OF BREATH  *UNUSUAL BRUISING OR BLEEDING  TENDERNESS IN MOUTH AND THROAT WITH OR WITHOUT PRESENCE OF ULCERS  *URINARY PROBLEMS  *BOWEL PROBLEMS  UNUSUAL RASH Items with * indicate a potential emergency and should be followed up as soon as possible.  Feel free to call the clinic should you have any questions or concerns. The clinic phone number is [phone].  Please show the [facility] at check-in to the Emergency Department and triage nurse.

## 2019-09-21 ENCOUNTER — Inpatient Hospital Stay: Payer: No Typology Code available for payment source

## 2019-09-21 ENCOUNTER — Telehealth: Payer: Self-pay | Admitting: Hematology

## 2019-09-21 ENCOUNTER — Other Ambulatory Visit: Payer: Self-pay

## 2019-09-21 VITALS — BP 118/72 | HR 88 | Temp 98.2°F | Resp 18

## 2019-09-21 DIAGNOSIS — Z5111 Encounter for antineoplastic chemotherapy: Secondary | ICD-10-CM | POA: Diagnosis not present

## 2019-09-21 DIAGNOSIS — C8111 Nodular sclerosis classical Hodgkin lymphoma, lymph nodes of head, face, and neck: Secondary | ICD-10-CM

## 2019-09-21 DIAGNOSIS — Z7189 Other specified counseling: Secondary | ICD-10-CM

## 2019-09-21 MED ORDER — TBO-FILGRASTIM 480 MCG/0.8ML ~~LOC~~ SOSY
PREFILLED_SYRINGE | SUBCUTANEOUS | Status: AC
  Filled 2019-09-21: qty 0.8

## 2019-09-21 MED ORDER — TBO-FILGRASTIM 480 MCG/0.8ML ~~LOC~~ SOSY
480.0000 ug | PREFILLED_SYRINGE | Freq: Once | SUBCUTANEOUS | Status: AC
  Administered 2019-09-21: 480 ug via SUBCUTANEOUS

## 2019-09-21 NOTE — Telephone Encounter (Signed)
Referral placed to [facility] via [facility]

## 2019-09-21 NOTE — Patient Instructions (Signed)
Tbo-Filgrastim injection What is this medicine? TBO-FILGRASTIM (T B O fil GRA stim) is a granulocyte colony-stimulating factor that helps you make more neutrophils, a type of white blood cell. Neutrophils are important for fighting infections. Some chemotherapy affects your bone marrow and lowers your neutrophils. This medicine helps decrease the length of time that neutrophils are very low (severe neutropenia). This medicine may be used for other purposes; ask your health care provider or pharmacist if you have questions. COMMON BRAND NAME(S): Granix What should I tell my health care provider before I take this medicine? They need to know if you have any of these conditions:  bone scan or tests planned  kidney disease  sickle cell anemia  an unusual or allergic reaction to tbo-filgrastim, filgrastim, pegfilgrastim, other medicines, foods, dyes, or preservatives  pregnant or trying to get pregnant  breast-feeding How should I use this medicine? This medicine is for injection under the skin. If you get this medicine at home, you will be taught how to prepare and give this medicine. Refer to the Instructions for Use that come with your medication packaging. Use exactly as directed. Take your medicine at regular intervals. Do not take your medicine more often than directed. It is important that you put your used needles and syringes in a special sharps container. Do not put them in a trash can. If you do not have a sharps container, call your pharmacist or healthcare provider to get one. Talk to your pediatrician regarding the use of this medicine in children. While this drug may be prescribed for children as young as 1 month of age for selected conditions, precautions do apply. Overdosage: If you think you have taken too much of this medicine contact a poison control center or emergency room at once. NOTE: This medicine is only for you. Do not share this medicine with others. What if I miss a  dose? It is important not to miss your dose. Call your doctor or health care professional if you miss a dose. What may interact with this medicine? This medicine may interact with the following medications:  medicines that may cause a release of neutrophils, such as lithium This list may not describe all possible interactions. Give your health care provider a list of all the medicines, herbs, non-prescription drugs, or dietary supplements you use. Also tell them if you smoke, drink alcohol, or use illegal drugs. Some items may interact with your medicine. What should I watch for while using this medicine? You may need blood work done while you are taking this medicine. What side effects may I notice from receiving this medicine? Side effects that you should report to your doctor or health care professional as soon as possible:  allergic reactions like skin rash, itching or hives, swelling of the face, lips, or tongue  back pain  blood in the urine  dark urine  dizziness  fast heartbeat  feeling faint  shortness of breath or breathing problems  signs and symptoms of infection like fever or chills; cough; or sore throat  signs and symptoms of kidney injury like trouble passing urine or change in the amount of urine  stomach or side pain, or pain at the shoulder  sweating  swelling of the legs, ankles, or abdomen  tiredness Side effects that usually do not require medical attention (report to your doctor or health care professional if they continue or are bothersome):  bone pain  diarrhea  headache  muscle pain  vomiting This list may   not describe all possible side effects. Call your doctor for medical advice about side effects. You may report side effects to FDA at 1-800-FDA-1088. Where should I keep my medicine? Keep out of the reach of children. Store in a refrigerator between 2 and 8 degrees C (36 and 46 degrees F). Keep in carton to protect from light. Throw  away this medicine if it is left out of the refrigerator for more than 5 consecutive days. Throw away any unused medicine after the expiration date. NOTE: This sheet is a summary. It may not cover all possible information. If you have questions about this medicine, talk to your doctor, pharmacist, or health care provider.  2020 Elsevier/Gold Standard (2018-07-04 19:58:39)  

## 2019-09-22 ENCOUNTER — Telehealth: Payer: Self-pay | Admitting: Emergency Medicine

## 2019-09-22 ENCOUNTER — Other Ambulatory Visit: Payer: Self-pay

## 2019-09-22 ENCOUNTER — Inpatient Hospital Stay: Payer: No Typology Code available for payment source

## 2019-09-22 VITALS — BP 122/72 | HR 78 | Temp 98.2°F | Resp 18

## 2019-09-22 DIAGNOSIS — Z5111 Encounter for antineoplastic chemotherapy: Secondary | ICD-10-CM | POA: Diagnosis not present

## 2019-09-22 DIAGNOSIS — C8111 Nodular sclerosis classical Hodgkin lymphoma, lymph nodes of head, face, and neck: Secondary | ICD-10-CM

## 2019-09-22 DIAGNOSIS — Z7189 Other specified counseling: Secondary | ICD-10-CM

## 2019-09-22 MED ORDER — TBO-FILGRASTIM 480 MCG/0.8ML ~~LOC~~ SOSY
480.0000 ug | PREFILLED_SYRINGE | Freq: Once | SUBCUTANEOUS | Status: AC
  Administered 2019-09-22: 16:00:00 480 ug via SUBCUTANEOUS

## 2019-09-22 MED ORDER — TBO-FILGRASTIM 480 MCG/0.8ML ~~LOC~~ SOSY
PREFILLED_SYRINGE | SUBCUTANEOUS | Status: AC
  Filled 2019-09-22: qty 0.8

## 2019-09-22 NOTE — Telephone Encounter (Signed)
Calling pt regarding infusion appt where abscess was re-evaluated by MD Irene Limbo.  Alerted pt that at this time unless there are any changes/new concerns she does not need to be re-seen by PA Lucianne Lei as she has been referred to gen surgeon for assessment.  Pt verbalized understanding, denies any further questions/concerns at this time.

## 2019-09-22 NOTE — Patient Instructions (Signed)
Tbo-Filgrastim injection What is this medicine? TBO-FILGRASTIM (T B O fil GRA stim) is a granulocyte colony-stimulating factor that helps you make more neutrophils, a type of white blood cell. Neutrophils are important for fighting infections. Some chemotherapy affects your bone marrow and lowers your neutrophils. This medicine helps decrease the length of time that neutrophils are very low (severe neutropenia). This medicine may be used for other purposes; ask your health care provider or pharmacist if you have questions. COMMON BRAND NAME(S): Granix What should I tell my health care provider before I take this medicine? They need to know if you have any of these conditions:  bone scan or tests planned  kidney disease  sickle cell anemia  an unusual or allergic reaction to tbo-filgrastim, filgrastim, pegfilgrastim, other medicines, foods, dyes, or preservatives  pregnant or trying to get pregnant  breast-feeding How should I use this medicine? This medicine is for injection under the skin. If you get this medicine at home, you will be taught how to prepare and give this medicine. Refer to the Instructions for Use that come with your medication packaging. Use exactly as directed. Take your medicine at regular intervals. Do not take your medicine more often than directed. It is important that you put your used needles and syringes in a special sharps container. Do not put them in a trash can. If you do not have a sharps container, call your pharmacist or healthcare provider to get one. Talk to your pediatrician regarding the use of this medicine in children. While this drug may be prescribed for children as young as 1 month of age for selected conditions, precautions do apply. Overdosage: If you think you have taken too much of this medicine contact a poison control center or emergency room at once. NOTE: This medicine is only for you. Do not share this medicine with others. What if I miss a  dose? It is important not to miss your dose. Call your doctor or health care professional if you miss a dose. What may interact with this medicine? This medicine may interact with the following medications:  medicines that may cause a release of neutrophils, such as lithium This list may not describe all possible interactions. Give your health care provider a list of all the medicines, herbs, non-prescription drugs, or dietary supplements you use. Also tell them if you smoke, drink alcohol, or use illegal drugs. Some items may interact with your medicine. What should I watch for while using this medicine? You may need blood work done while you are taking this medicine. What side effects may I notice from receiving this medicine? Side effects that you should report to your doctor or health care professional as soon as possible:  allergic reactions like skin rash, itching or hives, swelling of the face, lips, or tongue  back pain  blood in the urine  dark urine  dizziness  fast heartbeat  feeling faint  shortness of breath or breathing problems  signs and symptoms of infection like fever or chills; cough; or sore throat  signs and symptoms of kidney injury like trouble passing urine or change in the amount of urine  stomach or side pain, or pain at the shoulder  sweating  swelling of the legs, ankles, or abdomen  tiredness Side effects that usually do not require medical attention (report to your doctor or health care professional if they continue or are bothersome):  bone pain  diarrhea  headache  muscle pain  vomiting This list may   not describe all possible side effects. Call your doctor for medical advice about side effects. You may report side effects to FDA at 1-800-FDA-1088. Where should I keep my medicine? Keep out of the reach of children. Store in a refrigerator between 2 and 8 degrees C (36 and 46 degrees F). Keep in carton to protect from light. Throw  away this medicine if it is left out of the refrigerator for more than 5 consecutive days. Throw away any unused medicine after the expiration date. NOTE: This sheet is a summary. It may not cover all possible information. If you have questions about this medicine, talk to your doctor, pharmacist, or health care provider.  2020 Elsevier/Gold Standard (2018-07-04 19:58:39)  

## 2019-09-23 ENCOUNTER — Other Ambulatory Visit: Payer: Self-pay

## 2019-09-23 ENCOUNTER — Inpatient Hospital Stay: Payer: No Typology Code available for payment source

## 2019-09-23 VITALS — BP 128/72 | HR 72 | Temp 98.2°F | Resp 18

## 2019-09-23 DIAGNOSIS — Z7189 Other specified counseling: Secondary | ICD-10-CM

## 2019-09-23 DIAGNOSIS — C8111 Nodular sclerosis classical Hodgkin lymphoma, lymph nodes of head, face, and neck: Secondary | ICD-10-CM

## 2019-09-23 DIAGNOSIS — Z5111 Encounter for antineoplastic chemotherapy: Secondary | ICD-10-CM | POA: Diagnosis not present

## 2019-09-23 MED ORDER — FILGRASTIM-SNDZ 480 MCG/0.8ML IJ SOSY
480.0000 ug | PREFILLED_SYRINGE | Freq: Once | INTRAMUSCULAR | Status: AC
  Administered 2019-09-23: 480 ug via SUBCUTANEOUS

## 2019-09-23 MED ORDER — FILGRASTIM-SNDZ 480 MCG/0.8ML IJ SOSY
PREFILLED_SYRINGE | INTRAMUSCULAR | Status: AC
  Filled 2019-09-23: qty 0.8

## 2019-09-27 ENCOUNTER — Telehealth: Payer: Self-pay | Admitting: *Deleted

## 2019-09-27 MED FILL — CLINDAMYCIN HCL 300 MG CAPS: 300 | 7 days supply | Qty: 21 | Fill #0

## 2019-09-27 NOTE — Telephone Encounter (Signed)
Patient called - Has not heard from surgeon's office. Rt axilla looks better, but not completely gone. Now has similar bump on her bottom - starting like the one under arm did. Last day of Levaquin. Will Dr. Irene Limbo be giving another prescription of antibiotic? Dr. Irene Limbo given information and questions. [facility]. Referral placed for Dr. Kae Heller (urgent) on 09/20/19. Spoke with [place], new IT trainer. She states the referral came to them as a routine referral  - she upgraded to urgent and will contact patient to offer appt today with PA. Contacted patient, she now has appt for this afternoon with [facility]. Informed her that per Dr. Irene Limbo, can use Chlorhexidine body wash to decrease bacteria on skin. Another antibiotic may be prescribed, but he will wait until she is seen by PA in surgeon office. Patient verbalized understanding and states she will update this office.

## 2019-10-01 ENCOUNTER — Telehealth: Payer: Self-pay

## 2019-10-01 NOTE — Telephone Encounter (Signed)
Patient called to give an update on her visit to [facility]. Stated she was placed on Clindamycin and took med for 3 days but was instructed to stop due to GI upset. Patient stated she is not currently on antibiotics per instructions from MD at [facility]. Patient also stated an I&D was not done because per MD at [facility] "it was not worth the risk". Dr. Irene Limbo made aware of update.

## 2019-10-08 ENCOUNTER — Inpatient Hospital Stay (HOSPITAL_BASED_OUTPATIENT_CLINIC_OR_DEPARTMENT_OTHER): Payer: No Typology Code available for payment source | Admitting: Hematology

## 2019-10-08 ENCOUNTER — Inpatient Hospital Stay: Payer: No Typology Code available for payment source

## 2019-10-08 ENCOUNTER — Other Ambulatory Visit: Payer: Self-pay

## 2019-10-08 VITALS — BP 117/70 | HR 82 | Temp 98.3°F | Resp 16 | Ht 64.0 in | Wt 204.5 lb

## 2019-10-08 DIAGNOSIS — Z7189 Other specified counseling: Secondary | ICD-10-CM

## 2019-10-08 DIAGNOSIS — L0291 Cutaneous abscess, unspecified: Secondary | ICD-10-CM

## 2019-10-08 DIAGNOSIS — C8111 Nodular sclerosis classical Hodgkin lymphoma, lymph nodes of head, face, and neck: Secondary | ICD-10-CM

## 2019-10-08 DIAGNOSIS — C8110 Nodular sclerosis classical Hodgkin lymphoma, unspecified site: Secondary | ICD-10-CM | POA: Diagnosis not present

## 2019-10-08 DIAGNOSIS — Z5111 Encounter for antineoplastic chemotherapy: Secondary | ICD-10-CM

## 2019-10-08 DIAGNOSIS — C8118 Nodular sclerosis classical Hodgkin lymphoma, lymph nodes of multiple sites: Secondary | ICD-10-CM

## 2019-10-08 DIAGNOSIS — D500 Iron deficiency anemia secondary to blood loss (chronic): Secondary | ICD-10-CM

## 2019-10-08 LAB — CMP (CANCER CENTER ONLY)
ALT: 18 U/L (ref 0–44)
AST: 13 U/L — ABNORMAL LOW (ref 15–41)
Albumin: 4 g/dL (ref 3.5–5.0)
Alkaline Phosphatase: 53 U/L (ref 38–126)
Anion gap: 11 (ref 5–15)
BUN: 11 mg/dL (ref 6–20)
CO2: 26 mmol/L (ref 22–32)
Calcium: 9.4 mg/dL (ref 8.9–10.3)
Chloride: 107 mmol/L (ref 98–111)
Creatinine: 0.74 mg/dL (ref 0.44–1.00)
GFR, Est AFR Am: >60 mL/min (ref >60)
GFR, Estimated: >60 mL/min (ref >60)
Glucose, Bld: 84 mg/dL (ref 70–99)
Potassium: 4 mmol/L (ref 3.5–5.1)
Sodium: 144 mmol/L (ref 135–145)
Total Bilirubin: 0.9 mg/dL (ref 0.3–1.2)
Total Protein: 6.9 g/dL (ref 6.5–8.1)

## 2019-10-08 LAB — CBC WITH DIFFERENTIAL/PLATELET
Abs Immature Granulocytes: 0.02 10*3/uL (ref 0.00–0.07)
Basophils Absolute: 0 10*3/uL (ref 0.0–0.1)
Basophils Relative: 1 %
Eosinophils Absolute: 0.1 10*3/uL (ref 0.0–0.5)
Eosinophils Relative: 2 %
HCT: 39 % (ref 36.0–46.0)
Hemoglobin: 13 g/dL (ref 12.0–15.0)
Immature Granulocytes: 0 %
Lymphocytes Relative: 30 %
Lymphs Abs: 1.9 10*3/uL (ref 0.7–4.0)
MCH: 31.8 pg (ref 26.0–34.0)
MCHC: 33.3 g/dL (ref 30.0–36.0)
MCV: 95.4 fL (ref 80.0–100.0)
Monocytes Absolute: 0.6 10*3/uL (ref 0.1–1.0)
Monocytes Relative: 10 %
Neutro Abs: 3.6 10*3/uL (ref 1.7–7.7)
Neutrophils Relative %: 57 %
Platelets: 303 10*3/uL (ref 150–400)
RBC: 4.09 MIL/uL (ref 3.87–5.11)
RDW: 15.4 % (ref 11.5–15.5)
WBC: 6.2 10*3/uL (ref 4.0–10.5)
nRBC: 0 % (ref 0.0–0.2)

## 2019-10-08 MED ORDER — PALONOSETRON HCL INJECTION 0.25 MG/5ML
0.2500 mg | Freq: Once | INTRAVENOUS | Status: AC
  Administered 2019-10-08: 0.25 mg via INTRAVENOUS

## 2019-10-08 MED ORDER — LORAZEPAM 2 MG/ML IJ SOLN
0.5000 mg | Freq: Once | INTRAMUSCULAR | Status: AC
  Administered 2019-10-08: 0.5 mg via INTRAVENOUS

## 2019-10-08 MED ORDER — VINBLASTINE SULFATE CHEMO INJECTION 1 MG/ML
6.0000 mg/m2 | Freq: Once | INTRAVENOUS | Status: AC
  Administered 2019-10-08: 12 mg via INTRAVENOUS
  Filled 2019-10-08: qty 12

## 2019-10-08 MED ORDER — SODIUM CHLORIDE 0.9 % IV SOLN
Freq: Once | INTRAVENOUS | Status: AC
  Filled 2019-10-08: qty 250

## 2019-10-08 MED ORDER — LORAZEPAM 2 MG/ML IJ SOLN
INTRAMUSCULAR | Status: AC
  Filled 2019-10-08: qty 1

## 2019-10-08 MED ORDER — SODIUM CHLORIDE 0.9 % IV SOLN
Freq: Once | INTRAVENOUS | Status: AC
  Filled 2019-10-08: qty 5

## 2019-10-08 MED ORDER — SODIUM CHLORIDE 0.9% FLUSH
10.0000 mL | INTRAVENOUS | Status: DC | PRN
  Administered 2019-10-08: 10 mL
  Filled 2019-10-08: qty 10

## 2019-10-08 MED ORDER — HEPARIN SOD (PORK) LOCK FLUSH 100 UNIT/ML IV SOLN
500.0000 [IU] | Freq: Once | INTRAVENOUS | Status: AC | PRN
  Administered 2019-10-08: 500 [IU]
  Filled 2019-10-08: qty 5

## 2019-10-08 MED ORDER — DOXORUBICIN HCL CHEMO IV INJECTION 2 MG/ML
25.0000 mg/m2 | Freq: Once | INTRAVENOUS | Status: AC
  Administered 2019-10-08: 50 mg via INTRAVENOUS
  Filled 2019-10-08: qty 25

## 2019-10-08 MED ORDER — MUPIROCIN 2 % EX OINT: 1.0000 "application " | TOPICAL_OINTMENT | Freq: Two times a day (BID) | CUTANEOUS | 1 refills | Status: DC

## 2019-10-08 MED ORDER — PALONOSETRON HCL INJECTION 0.25 MG/5ML
INTRAVENOUS | Status: AC
  Filled 2019-10-08: qty 5

## 2019-10-08 MED ORDER — SODIUM CHLORIDE 0.9 % IV SOLN
375.0000 mg/m2 | Freq: Once | INTRAVENOUS | Status: AC
  Administered 2019-10-08: 750 mg via INTRAVENOUS
  Filled 2019-10-08: qty 75

## 2019-10-08 MED FILL — MUPIROCIN 2% OINTMENT: 2 | 14 days supply | Qty: 22 | Fill #0

## 2019-10-08 NOTE — Patient Instructions (Signed)
[facility] Discharge Instructions for Patients Receiving Chemotherapy  Today you received the following chemotherapy agents Doxorubicin (ADRIAMYCIN), Vinblastine (VELBAN) & Dacarbazine (DTIC).  To help prevent nausea and vomiting after your treatment, we encourage you to take your nausea medication as prescribed.   If you develop nausea and vomiting that is not controlled by your nausea medication, call the clinic.   BELOW ARE SYMPTOMS THAT SHOULD BE REPORTED IMMEDIATELY:  *FEVER GREATER THAN 100.5 F  *CHILLS WITH OR WITHOUT FEVER  NAUSEA AND VOMITING THAT IS NOT CONTROLLED WITH YOUR NAUSEA MEDICATION  *UNUSUAL SHORTNESS OF BREATH  *UNUSUAL BRUISING OR BLEEDING  TENDERNESS IN MOUTH AND THROAT WITH OR WITHOUT PRESENCE OF ULCERS  *URINARY PROBLEMS  *BOWEL PROBLEMS  UNUSUAL RASH Items with * indicate a potential emergency and should be followed up as soon as possible.  Feel free to call the clinic should you have any questions or concerns. The clinic phone number is [phone].  Please show the [facility] at check-in to the Emergency Department and triage nurse.  Coronavirus (COVID-19) Are you at risk?  Are you at risk for the Coronavirus (COVID-19)?  To be considered HIGH RISK for Coronavirus (COVID-19), you have to meet the following criteria:  . Traveled to [place], [place], [place], [place] or [place]; or in the [place] to [place], [place], [place], or [place]; and have fever, cough, and shortness of breath within the last 2 weeks of travel OR . Been in close contact with a person diagnosed with COVID-19 within the last 2 weeks and have fever, cough, and shortness of breath . IF YOU DO NOT MEET THESE CRITERIA, YOU ARE CONSIDERED LOW RISK FOR COVID-19.  What to do if you are HIGH RISK for COVID-19?  Marland Kitchen If you are having a medical emergency, call 911. . Seek medical care right away. Before you go to a doctor's office, urgent care or  emergency department, call ahead and tell them about your recent travel, contact with someone diagnosed with COVID-19, and your symptoms. You should receive instructions from your physician's office regarding next steps of care.  . When you arrive at healthcare provider, tell the healthcare staff immediately you have returned from visiting [place], [place], [place], [place] or [place]; or traveled in the [place] to [place], [place], [place], or [place]; in the last two weeks or you have been in close contact with a person diagnosed with COVID-19 in the last 2 weeks.   . Tell the health care staff about your symptoms: fever, cough and shortness of breath. . After you have been seen by a medical provider, you will be either: o Tested for (COVID-19) and discharged home on quarantine except to seek medical care if symptoms worsen, and asked to  - Stay home and avoid contact with others until you get your results (4-5 days)  - Avoid travel on public transportation if possible (such as bus, train, or airplane) or o Sent to the Emergency Department by EMS for evaluation, COVID-19 testing, and possible admission depending on your condition and test results.  What to do if you are LOW RISK for COVID-19?  Reduce your risk of any infection by using the same precautions used for avoiding the common cold or flu:  Marland Kitchen Wash your hands often with soap and warm water for at least 20 seconds.  If soap and water are not readily available, use an alcohol-based hand sanitizer with at least 60% alcohol.  Marland Kitchen  If coughing or sneezing, cover your mouth and nose by coughing or sneezing into the elbow areas of your shirt or coat, into a tissue or into your sleeve (not your hands). . Avoid shaking hands with others and consider head nods or verbal greetings only. . Avoid touching your eyes, nose, or mouth with unwashed hands.  . Avoid close contact with people who are sick. . Avoid places or events with large numbers  of people in one location, like concerts or sporting events. . Carefully consider travel plans you have or are making. . If you are planning any travel outside or inside the [place], visit the CDC's Travelers' Health webpage for the latest health notices. . If you have some symptoms but not all symptoms, continue to monitor at home and seek medical attention if your symptoms worsen. . If you are having a medical emergency, call 911.   [facility] / e-Visit: [email or web address]         [facility]: [facility]: [phone]                   [facility]: [phone]

## 2019-10-08 NOTE — Progress Notes (Signed)
HEMATOLOGY/ONCOLOGY CLINIC NOTE  Date of Service: 10/08/19   Patient Care Team: [name], MD as PCP - General (Family Medicine)  CHIEF COMPLAINTS/PURPOSE OF CONSULTATION:   Continue management of classical hodgkins lymphoma   CURRENT TREATMENT: Cycle 4 day 15 with granix support   HISTORY OF PRESENTING ILLNESS:   Elizabeth Beasley is a wonderful 39 y.o. female who has been referred to [place] by Dr Madilyn Fireman for evaluation and management of microcytic anemia/cervical lymphadenopathy.The pt reports that she has a history of iron deficiency but has never had a transfusion. Pt has had two pregnancies and has two children, ages 38 and 56. There was a concern for anemia with her second pregnancy so she was given an erythropoietin shot. She has had a copper IUD for about 2 years and her menstrual cycle has been slightly longer since getting the IUD. She has been taking 28 mg TID PO iron since June. Pt has no known medication allergies and has had a previous removal of Bartholin's gland cyst. Pt has had no known contact with chemicals either through work or hobbies. She has no history of asthma or heavy smoke/aerosol exposure.   Pt did not notice any cervical lymphadenopathy in June. In late July, early August she began to experience extreme fatigue and could begin to feel small lumps in her neck. Her fatigue is improved with taking her PO Iron TID. She has experienced a couple of episodes of night sweats, with the latest occurring in late August. Pt has also had <10 lbs loss in 2 months. She has not had any rashes but is experiencing itching in her palms, broader itching after hot showers.   Pt is currently working from home. She has had her seasonal flu shot. Pt is not planning on having more children and is not concerned about fertility. Pt's husband would like to take a drive to the [facility] next month. Pt would not want any blood product transfusions, as she is a Jehovah's  Witness. She is comfortable with IV iron infusions.   INTERVAL HISTORY:  Elizabeth Beasley is a 39 y.o. female here for evaluation and management of hodgkins lymphoma. The patient's last visit with [place] was on 09/20/2019. The pt reports that she is doing well overall.  The pt reports she has no new concerns.  She notices that the rt axillary abscess appears to have reduced. Seen by surgery and I&D not recommended.  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  Lab results today (10/08/19) of CBC w/diff and CMP is as follows: all values are WNL except for AST at 13.  On review of systems, pt reports no new concerns and denies fevers, chills, night sweats, mouth sores, rashes, new lumps and bumps, abdominal pain, leg swelling and any other symptoms.     MEDICAL HISTORY:  Past Medical History:  Diagnosis Date  . Anemia   . Bartholin gland cyst   . Hodgkin's lymphoma, nodular sclerosis ([place]) 06/08/2019  . Hx of seasonal allergies   . Lymphadenopathy, cervical 05/21/2019  . PONV (postoperative nausea and vomiting)   . 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 CESAREAN SECTION  2010  . MASS EXCISION Right 05/21/2019   Procedure: EXCISION OF RIGHT DEEP NECK LYMPH NODE;  Surgeon: Fanny Skates, MD;  Location: [facility];  Service: General;  Laterality:  Right;  Marland Kitchen PORTACATH PLACEMENT Right 06/08/2019   Procedure: INSERTION PORT-A-CATH WITH ULTRASOUND GUIDANCE;  Surgeon: Fanny Skates, MD;  Location: [facility];  Service: General;  Laterality: Right;  . URETHRAL CYST REMOVAL N/A 08/30/2016   Procedure: BARTHOLIN'S GLAND CYST EXCISION;  Surgeon: Ardis Hughs, MD;  Location: [facility];  Service: Urology;  Laterality: N/A;  . v back     2012    SOCIAL HISTORY: Social History   Socioeconomic History  . Marital status: Married    Spouse name:  Montine Circle  . Number of children: 2  . Years of education: Assoc  . Highest education level: Not on file  Occupational History  . Occupation: CMA    Employer: [employer]  Tobacco Use  . Smoking status: Never Smoker  . Smokeless tobacco: Never Used  Substance and Sexual Activity  . Alcohol use: Not Currently    Comment: ocassional  . Drug use: No  . Sexual activity: Yes    Partners: Male    Birth control/protection: I.U.D.  Other Topics Concern  . Not on file  Social History Narrative   Regular exercise. No regular caffeine.   Social Determinants of Health   Financial Resource Strain:   . Difficulty of Paying Living Expenses: Not on file  Food Insecurity:   . Worried About Charity fundraiser in the Last Year: Not on file  . Ran Out of Food in the Last Year: Not on file  Transportation Needs:   . Lack of Transportation (Medical): Not on file  . Lack of Transportation (Non-Medical): Not on file  Physical Activity:   . Days of Exercise per Week: Not on file  . Minutes of Exercise per Session: Not on file  Stress:   . Feeling of Stress : Not on file  Social Connections:   . Frequency of Communication with Friends and Family: Not on file  . Frequency of Social Gatherings with Friends and Family: Not on file  . Attends Religious Services: Not on file  . Active Member of Clubs or Organizations: Not on file  . Attends Archivist Meetings: Not on file  . Marital Status: Not on file  Intimate Partner Violence:   . Fear of Current or Ex-Partner: Not on file  . Emotionally Abused: Not on file  . Physically Abused: Not on file  . Sexually Abused: Not on file    FAMILY HISTORY: Family History  Problem Relation Age of Onset  . Colon cancer Father 86  . Colon polyps Mother   . Hypertension Mother   . Diabetes Paternal Grandmother   . Stroke Paternal Grandmother   . Alcohol abuse Paternal Uncle   . Colon cancer Paternal Aunt 38  . Esophageal cancer Neg Hx   . Rectal  cancer Neg Hx   . Stomach cancer Neg Hx    On PMHx the pt reports C-section, Bartholin's Gland Cyst removal. On Social Hx the pt reports non smoker, no alcohol use. On Family Hx the pt reports father passed from colon cancer, mother has colon polyps.  ALLERGIES:  has No Known Allergies.  MEDICATIONS:  Current Outpatient Medications  Medication Sig Dispense Refill  . b complex vitamins capsule Take 1 capsule by mouth daily.    . cholecalciferol (VITAMIN D3) 25 MCG (1000 UT) tablet Take 2,000 Units by mouth daily.    Marland Kitchen dexamethasone (DECADRON) 4 MG tablet Take 2 tablets by mouth once a day on the day after chemotherapy and  then take 2 tablets two times a day for 2 days. Take with food. 30 tablet 1  . fexofenadine (ALLEGRA) 180 MG tablet Take 180 mg by mouth daily as needed for allergies or rhinitis.    . fluconazole (DIFLUCAN) 150 MG tablet Take 1 tablet for a yeast infection if needed, may repeat in 3 days if needed 2 tablet 0  . levofloxacin (LEVAQUIN) 750 MG tablet Take 1 tablet (750 mg total) by mouth daily. 14 tablet 0  . LORazepam (ATIVAN) 0.5 MG tablet Take 1 tablet (0.5 mg total) by mouth every 6 (six) hours as needed (Nausea or vomiting). 30 tablet 0  . mupirocin ointment (BACTROBAN) 2 % Place 1 application into the nose 2 (two) times daily. 22 g 1  . ondansetron (ZOFRAN) 8 MG tablet Take 1 tablet (8 mg total) by mouth 2 (two) times daily as needed. Start on the third day after chemotherapy. 30 tablet 1  . potassium chloride SA (KLOR-CON) 20 MEQ tablet Take 1 tablet (20 mEq total) by mouth 2 (two) times daily. 60 tablet 1  . prochlorperazine (COMPAZINE) 10 MG tablet Take 1 tablet (10 mg total) by mouth every 6 (six) hours as needed (Nausea or vomiting). 30 tablet 1  . sulfamethoxazole-trimethoprim (BACTRIM DS) 800-160 MG tablet Take 1 tablet by mouth 2 (two) times daily. 14 tablet 0   No current facility-administered medications for this visit.   Facility-Administered Medications  Ordered in Other Visits  Medication Dose Route Frequency Provider Last Rate Last Admin  . sodium chloride flush (NS) 0.9 % injection 10 mL  10 mL Intracatheter PRN Brunetta Genera, MD   10 mL at 08/05/19 1658    REVIEW OF SYSTEMS:   A 10+ POINT REVIEW OF SYSTEMS WAS OBTAINED including neurology, dermatology, psychiatry, cardiac, respiratory, lymph, extremities, GI, GU, Musculoskeletal, constitutional, breasts, reproductive, HEENT.  All pertinent positives are noted in the HPI.  All others are negative.    PHYSICAL EXAMINATION: ECOG FS:1 - Symptomatic but completely ambulatory  Vitals:   10/08/19 0925  BP: 117/70  Pulse: 82  Resp: 16  Temp: 98.3 F (36.8 C)  SpO2: 100%   Wt Readings from Last 3 Encounters:  10/08/19 204 lb 8 oz (92.8 kg)  09/20/19 204 lb (92.5 kg)  09/13/19 203 lb 8 oz (92.3 kg)   Body mass index is 35.1 kg/m.    GENERAL:alert, in no acute distress and comfortable SKIN: no acute rashes, no significant lesions EYES: conjunctiva are pink and non-injected, sclera anicteric OROPHARYNX: MMM, no exudates, no oropharyngeal erythema or ulceration NECK: supple, no JVD LYMPH:  no palpable lymphadenopathy in the cervical, axillary or inguinal regions LUNGS: clear to auscultation b/l with normal respiratory effort HEART: regular rate & rhythm ABDOMEN:  normoactive bowel sounds , non tender, not distended. Extremity: no pedal edema PSYCH: alert & oriented x 3 with fluent speech NEURO: no focal motor/sensory deficits   LABORATORY DATA:  I have reviewed the data as listed  - 05/14/2019 Fe+TIBC+Fer is as follows: Iron at 11, TIBC at 258, % Sat at 4, Ferritin at 50. -Discussed 05/21/2019 Lymph Node Bx [id number]) which revealed " CLASSIC HODGKIN LYMPHOMA."  - 05/19/2019 CT C/A/P ([id number]) which revealed  "1. Enlarged mediastinal, bilateral hilar and lower cervical lymph nodes. Primary considerations include lymphoma, sarcoid and less likely metastatic disease.  Normal spleen. No evidence of enlarged lymph nodes within the abdomen or pelvis. 2. Low lying IUD within the LOWER uterine segment and may extend to the cervix."  -  05/17/2019 CT Soft Tissue Neck w/contrast ([id number]) which revealed "Bulky right greater than left cervical and mediastinal lymphadenopathy concerning for lymphoma."  . CBC Latest Ref Rng & Units 09/20/2019 09/13/2019 09/02/2019  WBC 4.0 - 10.5 K/uL 8.1 4.6 4.2  Hemoglobin 12.0 - 15.0 g/dL 12.8 12.1 12.4  Hematocrit 36.0 - 46.0 % 38.4 37.0 36.8  Platelets 150 - 400 K/uL 282 276 266    . CMP Latest Ref Rng & Units 09/20/2019 09/02/2019 08/20/2019  Glucose 70 - 99 mg/dL 87 124(H) 88  BUN 6 - 20 mg/dL 10 12 7   Creatinine 0.44 - 1.00 mg/dL 0.67 0.79 0.69  Sodium 135 - 145 mmol/L 140 142 143  Potassium 3.5 - 5.1 mmol/L 3.7 3.5 3.5  Chloride 98 - 111 mmol/L 105 104 107  CO2 22 - 32 mmol/L 25 25 28   Calcium 8.9 - 10.3 mg/dL 9.4 8.9 9.1  Total Protein 6.5 - 8.1 g/dL 6.8 6.8 6.7  Total Bilirubin 0.3 - 1.2 mg/dL 0.8 0.3 0.5  Alkaline Phos 38 - 126 U/L 40 56 56  AST 15 - 41 U/L 17 13(L) 16  ALT 0 - 44 U/L 23 18 20    . Lab Results  Component Value Date   IRON 79 07/09/2019   TIBC 221 (L) 07/09/2019   IRONPCTSAT 36 07/09/2019   (Iron and TIBC)  Lab Results  Component Value Date   FERRITIN 590 (H) 07/09/2019  08/13/2019 NM PET Image Restag (PS) Skull Base To Thigh (Accession [id number])   05/21/2019 [id number]) Lymph Node Biopsy    RADIOGRAPHIC STUDIES: I have personally reviewed the radiological images as listed and agreed with the findings in the report. No results found.  ASSESSMENT & PLAN:   1) Recently diagnosed Classical Hodgkins lymphoma Likely stage IIIB based on imaging thus far. 2) Microcytic Anemia due to anemia of chronic disease and Iron deficiency- hg improved to 13 3) Jehovah's Witness   PLAN: -Discussed pt labwork today, 10/08/19; all values are WNL except for AST at 13. -Recommended eating cultured  yogurt instead of taking probiotics until she has completed chemotherapy. -Discussed that abcess under her right armpit has improved. - Discussed that she is in cycle 4 day 15 with a remaining 2 cycles. Advise that scans will be ordered once she has completed all cycles of chemotherapy -Will continue current treatment today -Will see back in 4 weeks   FOLLOW UP: Please schedule next 2 cycles (C5D1 and C5D15 and C6D1 and C6D15 with granix as ordered)  Portflush and labs with each treatment MD visit in 4 weeks    The total time spent in the appt was 30 minutes and more than 50% was on counseling and direct patient cares.  All of the patient's questions were answered with apparent satisfaction. The patient knows to call the clinic with any problems, questions or concerns.    Sullivan Lone MD MS AAHIVMS [facility] Hematology/Oncology Physician [facility]  (Office):       [phone] (Work cell):  [phone] (Fax):           [phone]  10/08/2019 12:53 AM  I, Scot Dock, am acting as a scribe for Dr. Sullivan Lone.   .I have reviewed the above documentation for accuracy and completeness, and I agree with the above. Brunetta Genera MD

## 2019-10-09 ENCOUNTER — Inpatient Hospital Stay: Payer: No Typology Code available for payment source

## 2019-10-09 ENCOUNTER — Other Ambulatory Visit: Payer: Self-pay

## 2019-10-09 VITALS — BP 143/69 | HR 115 | Temp 97.8°F | Resp 16

## 2019-10-09 DIAGNOSIS — C8111 Nodular sclerosis classical Hodgkin lymphoma, lymph nodes of head, face, and neck: Secondary | ICD-10-CM

## 2019-10-09 DIAGNOSIS — Z5111 Encounter for antineoplastic chemotherapy: Secondary | ICD-10-CM | POA: Diagnosis not present

## 2019-10-09 DIAGNOSIS — Z7189 Other specified counseling: Secondary | ICD-10-CM

## 2019-10-09 MED ORDER — FILGRASTIM-SNDZ 480 MCG/0.8ML IJ SOSY
480.0000 ug | PREFILLED_SYRINGE | Freq: Once | INTRAMUSCULAR | Status: AC
  Administered 2019-10-09: 480 ug via SUBCUTANEOUS

## 2019-10-11 ENCOUNTER — Inpatient Hospital Stay: Payer: No Typology Code available for payment source

## 2019-10-11 ENCOUNTER — Other Ambulatory Visit: Payer: Self-pay

## 2019-10-11 VITALS — BP 138/77 | HR 98 | Temp 98.1°F | Resp 16

## 2019-10-11 DIAGNOSIS — Z5111 Encounter for antineoplastic chemotherapy: Secondary | ICD-10-CM | POA: Diagnosis not present

## 2019-10-11 DIAGNOSIS — Z7189 Other specified counseling: Secondary | ICD-10-CM

## 2019-10-11 DIAGNOSIS — C8111 Nodular sclerosis classical Hodgkin lymphoma, lymph nodes of head, face, and neck: Secondary | ICD-10-CM

## 2019-10-11 MED ORDER — FILGRASTIM-SNDZ 480 MCG/0.8ML IJ SOSY
480.0000 ug | PREFILLED_SYRINGE | Freq: Once | INTRAMUSCULAR | Status: AC
  Administered 2019-10-11: 480 ug via SUBCUTANEOUS

## 2019-10-12 ENCOUNTER — Inpatient Hospital Stay: Payer: No Typology Code available for payment source

## 2019-10-12 ENCOUNTER — Other Ambulatory Visit: Payer: Self-pay

## 2019-10-12 VITALS — BP 142/72 | HR 78 | Temp 98.7°F | Resp 18

## 2019-10-12 DIAGNOSIS — Z7189 Other specified counseling: Secondary | ICD-10-CM

## 2019-10-12 DIAGNOSIS — Z5111 Encounter for antineoplastic chemotherapy: Secondary | ICD-10-CM | POA: Diagnosis not present

## 2019-10-12 DIAGNOSIS — C8111 Nodular sclerosis classical Hodgkin lymphoma, lymph nodes of head, face, and neck: Secondary | ICD-10-CM

## 2019-10-12 MED ORDER — FILGRASTIM-SNDZ 480 MCG/0.8ML IJ SOSY
480.0000 ug | PREFILLED_SYRINGE | Freq: Once | INTRAMUSCULAR | Status: AC
  Administered 2019-10-12: 480 ug via SUBCUTANEOUS

## 2019-10-22 ENCOUNTER — Inpatient Hospital Stay: Payer: No Typology Code available for payment source

## 2019-10-22 ENCOUNTER — Other Ambulatory Visit: Payer: Self-pay

## 2019-10-22 ENCOUNTER — Inpatient Hospital Stay: Payer: No Typology Code available for payment source | Attending: Hematology

## 2019-10-22 ENCOUNTER — Inpatient Hospital Stay (HOSPITAL_BASED_OUTPATIENT_CLINIC_OR_DEPARTMENT_OTHER): Payer: No Typology Code available for payment source | Admitting: Hematology

## 2019-10-22 VITALS — BP 109/62 | HR 99 | Temp 98.0°F | Resp 18 | Ht 64.0 in | Wt 206.8 lb

## 2019-10-22 DIAGNOSIS — Z79899 Other long term (current) drug therapy: Secondary | ICD-10-CM | POA: Insufficient documentation

## 2019-10-22 DIAGNOSIS — L02411 Cutaneous abscess of right axilla: Secondary | ICD-10-CM | POA: Diagnosis not present

## 2019-10-22 DIAGNOSIS — Z7689 Persons encountering health services in other specified circumstances: Secondary | ICD-10-CM | POA: Insufficient documentation

## 2019-10-22 DIAGNOSIS — R5383 Other fatigue: Secondary | ICD-10-CM | POA: Insufficient documentation

## 2019-10-22 DIAGNOSIS — Z5111 Encounter for antineoplastic chemotherapy: Secondary | ICD-10-CM | POA: Insufficient documentation

## 2019-10-22 DIAGNOSIS — C8191 Hodgkin lymphoma, unspecified, lymph nodes of head, face, and neck: Secondary | ICD-10-CM | POA: Insufficient documentation

## 2019-10-22 DIAGNOSIS — D638 Anemia in other chronic diseases classified elsewhere: Secondary | ICD-10-CM | POA: Insufficient documentation

## 2019-10-22 DIAGNOSIS — R634 Abnormal weight loss: Secondary | ICD-10-CM | POA: Diagnosis not present

## 2019-10-22 DIAGNOSIS — Z7189 Other specified counseling: Secondary | ICD-10-CM

## 2019-10-22 DIAGNOSIS — R202 Paresthesia of skin: Secondary | ICD-10-CM | POA: Diagnosis not present

## 2019-10-22 DIAGNOSIS — C8110 Nodular sclerosis classical Hodgkin lymphoma, unspecified site: Secondary | ICD-10-CM

## 2019-10-22 DIAGNOSIS — C8118 Nodular sclerosis classical Hodgkin lymphoma, lymph nodes of multiple sites: Secondary | ICD-10-CM | POA: Diagnosis not present

## 2019-10-22 DIAGNOSIS — R61 Generalized hyperhidrosis: Secondary | ICD-10-CM | POA: Diagnosis not present

## 2019-10-22 DIAGNOSIS — Z7952 Long term (current) use of systemic steroids: Secondary | ICD-10-CM | POA: Insufficient documentation

## 2019-10-22 DIAGNOSIS — C8111 Nodular sclerosis classical Hodgkin lymphoma, lymph nodes of head, face, and neck: Secondary | ICD-10-CM

## 2019-10-22 LAB — CBC WITH DIFFERENTIAL/PLATELET
Abs Immature Granulocytes: 0.04 10*3/uL (ref 0.00–0.07)
Basophils Absolute: 0 10*3/uL (ref 0.0–0.1)
Basophils Relative: 0 %
Eosinophils Absolute: 0.1 10*3/uL (ref 0.0–0.5)
Eosinophils Relative: 1 %
HCT: 37.2 % (ref 36.0–46.0)
Hemoglobin: 12.3 g/dL (ref 12.0–15.0)
Immature Granulocytes: 1 %
Lymphocytes Relative: 27 %
Lymphs Abs: 1.6 10*3/uL (ref 0.7–4.0)
MCH: 32.2 pg (ref 26.0–34.0)
MCHC: 33.1 g/dL (ref 30.0–36.0)
MCV: 97.4 fL (ref 80.0–100.0)
Monocytes Absolute: 0.5 10*3/uL (ref 0.1–1.0)
Monocytes Relative: 9 %
Neutro Abs: 3.9 10*3/uL (ref 1.7–7.7)
Neutrophils Relative %: 62 %
Platelets: 231 10*3/uL (ref 150–400)
RBC: 3.82 MIL/uL — ABNORMAL LOW (ref 3.87–5.11)
RDW: 14.7 % (ref 11.5–15.5)
WBC: 6.1 10*3/uL (ref 4.0–10.5)
nRBC: 0 % (ref 0.0–0.2)

## 2019-10-22 LAB — CMP (CANCER CENTER ONLY)
ALT: 19 U/L (ref 0–44)
AST: 12 U/L — ABNORMAL LOW (ref 15–41)
Albumin: 3.6 g/dL (ref 3.5–5.0)
Alkaline Phosphatase: 53 U/L (ref 38–126)
Anion gap: 8 (ref 5–15)
BUN: 14 mg/dL (ref 6–20)
CO2: 24 mmol/L (ref 22–32)
Calcium: 8.5 mg/dL — ABNORMAL LOW (ref 8.9–10.3)
Chloride: 110 mmol/L (ref 98–111)
Creatinine: 0.73 mg/dL (ref 0.44–1.00)
GFR, Est AFR Am: >60 mL/min (ref >60)
GFR, Estimated: >60 mL/min (ref >60)
Glucose, Bld: 106 mg/dL — ABNORMAL HIGH (ref 70–99)
Potassium: 3.7 mmol/L (ref 3.5–5.1)
Sodium: 142 mmol/L (ref 135–145)
Total Bilirubin: 0.5 mg/dL (ref 0.3–1.2)
Total Protein: 6.3 g/dL — ABNORMAL LOW (ref 6.5–8.1)

## 2019-10-22 MED ORDER — SODIUM CHLORIDE 0.9 % IV SOLN
Freq: Once | INTRAVENOUS | Status: AC
  Filled 2019-10-22: qty 5

## 2019-10-22 MED ORDER — VINBLASTINE SULFATE CHEMO INJECTION 1 MG/ML
6.0000 mg/m2 | Freq: Once | INTRAVENOUS | Status: AC
  Administered 2019-10-22: 12 mg via INTRAVENOUS
  Filled 2019-10-22: qty 12

## 2019-10-22 MED ORDER — HEPARIN SOD (PORK) LOCK FLUSH 100 UNIT/ML IV SOLN
500.0000 [IU] | Freq: Once | INTRAVENOUS | Status: AC | PRN
  Administered 2019-10-22: 500 [IU]
  Filled 2019-10-22: qty 5

## 2019-10-22 MED ORDER — PALONOSETRON HCL INJECTION 0.25 MG/5ML
0.2500 mg | Freq: Once | INTRAVENOUS | Status: AC
  Administered 2019-10-22: 0.25 mg via INTRAVENOUS

## 2019-10-22 MED ORDER — PALONOSETRON HCL INJECTION 0.25 MG/5ML
INTRAVENOUS | Status: AC
  Filled 2019-10-22: qty 5

## 2019-10-22 MED ORDER — LORAZEPAM 2 MG/ML IJ SOLN
INTRAMUSCULAR | Status: AC
  Filled 2019-10-22: qty 1

## 2019-10-22 MED ORDER — DOXORUBICIN HCL CHEMO IV INJECTION 2 MG/ML
25.0000 mg/m2 | Freq: Once | INTRAVENOUS | Status: AC
  Administered 2019-10-22: 50 mg via INTRAVENOUS
  Filled 2019-10-22: qty 25

## 2019-10-22 MED ORDER — SODIUM CHLORIDE 0.9 % IV SOLN
375.0000 mg/m2 | Freq: Once | INTRAVENOUS | Status: AC
  Administered 2019-10-22: 750 mg via INTRAVENOUS
  Filled 2019-10-22: qty 75

## 2019-10-22 MED ORDER — SODIUM CHLORIDE 0.9% FLUSH
10.0000 mL | INTRAVENOUS | Status: DC | PRN
  Administered 2019-10-22: 10 mL
  Filled 2019-10-22: qty 10

## 2019-10-22 MED ORDER — LORAZEPAM 2 MG/ML IJ SOLN
0.5000 mg | Freq: Once | INTRAMUSCULAR | Status: AC
  Administered 2019-10-22: 0.5 mg via INTRAVENOUS

## 2019-10-22 MED ORDER — SODIUM CHLORIDE 0.9 % IV SOLN
Freq: Once | INTRAVENOUS | Status: AC
  Filled 2019-10-22: qty 250

## 2019-10-22 NOTE — Patient Instructions (Signed)
[facility] Discharge Instructions for Patients Receiving Chemotherapy  Today you received the following chemotherapy agents Doxorubicin (ADRIAMYCIN), Vinblastine (VELBAN) & Dacarbazine (DTIC).  To help prevent nausea and vomiting after your treatment, we encourage you to take your nausea medication as prescribed.   If you develop nausea and vomiting that is not controlled by your nausea medication, call the clinic.   BELOW ARE SYMPTOMS THAT SHOULD BE REPORTED IMMEDIATELY:  *FEVER GREATER THAN 100.5 F  *CHILLS WITH OR WITHOUT FEVER  NAUSEA AND VOMITING THAT IS NOT CONTROLLED WITH YOUR NAUSEA MEDICATION  *UNUSUAL SHORTNESS OF BREATH  *UNUSUAL BRUISING OR BLEEDING  TENDERNESS IN MOUTH AND THROAT WITH OR WITHOUT PRESENCE OF ULCERS  *URINARY PROBLEMS  *BOWEL PROBLEMS  UNUSUAL RASH Items with * indicate a potential emergency and should be followed up as soon as possible.  Feel free to call the clinic should you have any questions or concerns. The clinic phone number is [phone].  Please show the [place] at check-in to the Emergency Department and triage nurse.  Coronavirus (COVID-19) Are you at risk?  Are you at risk for the Coronavirus (COVID-19)?  To be considered HIGH RISK for Coronavirus (COVID-19), you have to meet the following criteria:  . Traveled to [place], [place], [place], [place] or [place]; or in the [place] to [place], [place], [place], or [place]; and have fever, cough, and shortness of breath within the last 2 weeks of travel OR . Been in close contact with a person diagnosed with COVID-19 within the last 2 weeks and have fever, cough, and shortness of breath . IF YOU DO NOT MEET THESE CRITERIA, YOU ARE CONSIDERED LOW RISK FOR COVID-19.  What to do if you are HIGH RISK for COVID-19?  Marland Kitchen If you are having a medical emergency, call 911. . Seek medical care right away. Before you go to a doctor's office, urgent care or  emergency department, call ahead and tell them about your recent travel, contact with someone diagnosed with COVID-19, and your symptoms. You should receive instructions from your physician's office regarding next steps of care.  . When you arrive at healthcare provider, tell the healthcare staff immediately you have returned from visiting [place], [place], [place], [place] or [place]; or traveled in the [place] to [place], [place], [place], or [place]; in the last two weeks or you have been in close contact with a person diagnosed with COVID-19 in the last 2 weeks.   . Tell the health care staff about your symptoms: fever, cough and shortness of breath. . After you have been seen by a medical provider, you will be either: o Tested for (COVID-19) and discharged home on quarantine except to seek medical care if symptoms worsen, and asked to  - Stay home and avoid contact with others until you get your results (4-5 days)  - Avoid travel on public transportation if possible (such as bus, train, or airplane) or o Sent to the Emergency Department by EMS for evaluation, COVID-19 testing, and possible admission depending on your condition and test results.  What to do if you are LOW RISK for COVID-19?  Reduce your risk of any infection by using the same precautions used for avoiding the common cold or flu:  Marland Kitchen Wash your hands often with soap and warm water for at least 20 seconds.  If soap and water are not readily available, use an alcohol-based hand sanitizer with at least 60% alcohol.  Marland Kitchen  If coughing or sneezing, cover your mouth and nose by coughing or sneezing into the elbow areas of your shirt or coat, into a tissue or into your sleeve (not your hands). . Avoid shaking hands with others and consider head nods or verbal greetings only. . Avoid touching your eyes, nose, or mouth with unwashed hands.  . Avoid close contact with people who are sick. . Avoid places or events with large numbers  of people in one location, like concerts or sporting events. . Carefully consider travel plans you have or are making. . If you are planning any travel outside or inside the [place], visit the CDC's Travelers' Health webpage for the latest health notices. . If you have some symptoms but not all symptoms, continue to monitor at home and seek medical attention if your symptoms worsen. . If you are having a medical emergency, call 911.   [place] / e-Visit: [email or web address]         [facility]: [facility]: [phone]                   [facility]: [phone]

## 2019-10-22 NOTE — Progress Notes (Signed)
HEMATOLOGY/ONCOLOGY CLINIC NOTE  Date of Service: 10/22/19   Patient Care Team: [name], MD as PCP - General (Family Medicine)  CHIEF COMPLAINTS/PURPOSE OF CONSULTATION:   Continue management of classical hodgkins lymphoma   CURRENT TREATMENT: Cycle 4 day 15 with granix support   HISTORY OF PRESENTING ILLNESS:   Elizabeth Beasley is a wonderful 39 y.o. female who has been referred to [place] by Dr Madilyn Fireman for evaluation and management of microcytic anemia/cervical lymphadenopathy.The pt reports that she has a history of iron deficiency but has never had a transfusion. Pt has had two pregnancies and has two children, ages 19 and 69. There was a concern for anemia with her second pregnancy so she was given an erythropoietin shot. She has had a copper IUD for about 2 years and her menstrual cycle has been slightly longer since getting the IUD. She has been taking 28 mg TID PO iron since June. Pt has no known medication allergies and has had a previous removal of Bartholin's gland cyst. Pt has had no known contact with chemicals either through work or hobbies. She has no history of asthma or heavy smoke/aerosol exposure.   Pt did not notice any cervical lymphadenopathy in June. In late July, early August she began to experience extreme fatigue and could begin to feel small lumps in her neck. Her fatigue is improved with taking her PO Iron TID. She has experienced a couple of episodes of night sweats, with the latest occurring in late August. Pt has also had <10 lbs loss in 2 months. She has not had any rashes but is experiencing itching in her palms, broader itching after hot showers.   Pt is currently working from home. She has had her seasonal flu shot. Pt is not planning on having more children and is not concerned about fertility. Pt's husband would like to take a drive to the [facility] next month. Pt would not want any blood product transfusions, as she is a Jehovah's  Witness. She is comfortable with IV iron infusions.   INTERVAL HISTORY:  Elizabeth Beasley is a 39 y.o. female here for evaluation and management of hodgkins lymphoma. The patient's last visit with [place] was on 10/08/2019. The pt reports that she is doing well overall.  The pt reports abscess is much improved and nearly resolved.  She still has some tingling in her fingers and hands  She has body aches that are persistence. She is taking tylenol for the pain but it is not helping the pain.  Lab results today (10/22/19) of CBC w/diff and CMP is as follows: all values are WNL except for RBC at 3.82, Glucose Bld at 106, Calcium at 8.5, total protein at 6.3, AST at 12.  On review of systems, pt reports no new concerns and denies fevers, chills, menstrual periods, leg swelling, bone pains, pain around her port a cath, leg swelling, abdominal pain, and any other symptoms.    MEDICAL HISTORY:  Past Medical History:  Diagnosis Date  . Anemia   . Bartholin gland cyst   . 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 Hx of seasonal allergies   . Lymphadenopathy, cervical 05/21/2019  . PONV (postoperative nausea and vomiting)   . 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 CESAREAN SECTION  2010  . MASS EXCISION Right 05/21/2019   Procedure: EXCISION  OF RIGHT DEEP NECK LYMPH NODE;  Surgeon: Fanny Skates, MD;  Location: [place];  Service: General;  Laterality: Right;  . PORTACATH PLACEMENT Right 06/08/2019   Procedure: INSERTION PORT-A-CATH WITH ULTRASOUND GUIDANCE;  Surgeon: Fanny Skates, MD;  Location: [place];  Service: General;  Laterality: Right;  . URETHRAL CYST REMOVAL N/A 08/30/2016   Procedure: BARTHOLIN'S GLAND CYST EXCISION;  Surgeon: Ardis Hughs, MD;  Location: [facility];  Service: Urology;  Laterality: N/A;  . v back     2012     SOCIAL HISTORY: Social History   Socioeconomic History  . Marital status: Married    Spouse name: Montine Circle  . Number of children: 2  . Years of education: Assoc  . Highest education level: Not on file  Occupational History  . Occupation: CMA    Employer: [employer]  Tobacco Use  . Smoking status: Never Smoker  . Smokeless tobacco: Never Used  Substance and Sexual Activity  . Alcohol use: Not Currently    Comment: ocassional  . Drug use: No  . Sexual activity: Yes    Partners: Male    Birth control/protection: I.U.D.  Other Topics Concern  . Not on file  Social History Narrative   Regular exercise. No regular caffeine.   Social Determinants of Health   Financial Resource Strain:   . Difficulty of Paying Living Expenses: Not on file  Food Insecurity:   . Worried About Charity fundraiser in the Last Year: Not on file  . Ran Out of Food in the Last Year: Not on file  Transportation Needs:   . Lack of Transportation (Medical): Not on file  . Lack of Transportation (Non-Medical): Not on file  Physical Activity:   . Days of Exercise per Week: Not on file  . Minutes of Exercise per Session: Not on file  Stress:   . Feeling of Stress : Not on file  Social Connections:   . Frequency of Communication with Friends and Family: Not on file  . Frequency of Social Gatherings with Friends and Family: Not on file  . Attends Religious Services: Not on file  . Active Member of Clubs or Organizations: Not on file  . Attends Archivist Meetings: Not on file  . Marital Status: Not on file  Intimate Partner Violence:   . Fear of Current or Ex-Partner: Not on file  . Emotionally Abused: Not on file  . Physically Abused: Not on file  . Sexually Abused: Not on file    FAMILY HISTORY: Family History  Problem Relation Age of Onset  . Colon cancer Father 89  . Colon polyps Mother   . Hypertension Mother   . Diabetes Paternal Grandmother   . Stroke Paternal Grandmother    . Alcohol abuse Paternal Uncle   . Colon cancer Paternal Aunt 76  . Esophageal cancer Neg Hx   . Rectal cancer Neg Hx   . Stomach cancer Neg Hx    On PMHx the pt reports C-section, Bartholin's Gland Cyst removal. On Social Hx the pt reports non smoker, no alcohol use. On Family Hx the pt reports father passed from colon cancer, mother has colon polyps.  ALLERGIES:  has No Known Allergies.  MEDICATIONS:  Current Outpatient Medications  Medication Sig Dispense Refill  . b complex vitamins capsule Take 1 capsule by mouth daily.    . cholecalciferol (VITAMIN D3) 25 MCG (1000 UT) tablet Take 2,000 Units by mouth daily.    Marland Kitchen  dexamethasone (DECADRON) 4 MG tablet Take 2 tablets by mouth once a day on the day after chemotherapy and then take 2 tablets two times a day for 2 days. Take with food. 30 tablet 1  . fexofenadine (ALLEGRA) 180 MG tablet Take 180 mg by mouth daily as needed for allergies or rhinitis.    . fluconazole (DIFLUCAN) 150 MG tablet Take 1 tablet for a yeast infection if needed, may repeat in 3 days if needed (Patient not taking: Reported on 10/08/2019) 2 tablet 0  . levofloxacin (LEVAQUIN) 750 MG tablet Take 1 tablet (750 mg total) by mouth daily. (Patient not taking: Reported on 10/08/2019) 14 tablet 0  . LORazepam (ATIVAN) 0.5 MG tablet Take 1 tablet (0.5 mg total) by mouth every 6 (six) hours as needed (Nausea or vomiting). 30 tablet 0  . mupirocin ointment (BACTROBAN) 2 % Place 1 application into the nose 2 (two) times daily. 22 g 1  . ondansetron (ZOFRAN) 8 MG tablet Take 1 tablet (8 mg total) by mouth 2 (two) times daily as needed. Start on the third day after chemotherapy. 30 tablet 1  . potassium chloride SA (KLOR-CON) 20 MEQ tablet Take 1 tablet (20 mEq total) by mouth 2 (two) times daily. 60 tablet 1  . prochlorperazine (COMPAZINE) 10 MG tablet Take 1 tablet (10 mg total) by mouth every 6 (six) hours as needed (Nausea or vomiting). 30 tablet 1  .  sulfamethoxazole-trimethoprim (BACTRIM DS) 800-160 MG tablet Take 1 tablet by mouth 2 (two) times daily. (Patient not taking: Reported on 10/08/2019) 14 tablet 0   No current facility-administered medications for this visit.   Facility-Administered Medications Ordered in Other Visits  Medication Dose Route Frequency Provider Last Rate Last Admin  . sodium chloride flush (NS) 0.9 % injection 10 mL  10 mL Intracatheter PRN Brunetta Genera, MD   10 mL at 08/05/19 1658    REVIEW OF SYSTEMS:   A 10+ POINT REVIEW OF SYSTEMS WAS OBTAINED including neurology, dermatology, psychiatry, cardiac, respiratory, lymph, extremities, GI, GU, Musculoskeletal, constitutional, breasts, reproductive, HEENT.  All pertinent positives are noted in the HPI.  All others are negative.     PHYSICAL EXAMINATION: ECOG FS:1 - Symptomatic but completely ambulatory  Vitals:   10/22/19 0911  BP: 109/62  Pulse: 99  Resp: 18  Temp: 98 F (36.7 C)  SpO2: 100%   Wt Readings from Last 3 Encounters:  10/22/19 206 lb 12.8 oz (93.8 kg)  10/08/19 204 lb 8 oz (92.8 kg)  09/20/19 204 lb (92.5 kg)   Body mass index is 35.5 kg/m.    GENERAL:alert, in no acute distress and comfortable SKIN: no acute rashes, no significant lesions EYES: conjunctiva are pink and non-injected, sclera anicteric OROPHARYNX: MMM, no exudates, no oropharyngeal erythema or ulceration NECK: supple, no JVD LYMPH:  no palpable lymphadenopathy in the cervical, axillary or inguinal regions LUNGS: clear to auscultation b/l with normal respiratory effort HEART: regular rate & rhythm ABDOMEN:  normoactive bowel sounds , non tender, not distended. Extremity: no pedal edema PSYCH: alert & oriented x 3 with fluent speech NEURO: no focal motor/sensory deficits    LABORATORY DATA:  I have reviewed the data as listed  - 05/14/2019 Fe+TIBC+Fer is as follows: Iron at 11, TIBC at 258, % Sat at 4, Ferritin at 50. -Discussed 05/21/2019 Lymph Node Bx  [id number]) which revealed " CLASSIC HODGKIN LYMPHOMA."  - 05/19/2019 CT C/A/P ([id number]) which revealed  "1. Enlarged mediastinal, bilateral hilar and lower cervical lymph  nodes. Primary considerations include lymphoma, sarcoid and less likely metastatic disease. Normal spleen. No evidence of enlarged lymph nodes within the abdomen or pelvis. 2. Low lying IUD within the LOWER uterine segment and may extend to the cervix."  - 05/17/2019 CT Soft Tissue Neck w/contrast ([id number]) which revealed "Bulky right greater than left cervical and mediastinal lymphadenopathy concerning for lymphoma."  . CBC Latest Ref Rng & Units 10/22/2019 10/08/2019 09/20/2019  WBC 4.0 - 10.5 K/uL 6.1 6.2 8.1  Hemoglobin 12.0 - 15.0 g/dL 12.3 13.0 12.8  Hematocrit 36.0 - 46.0 % 37.2 39.0 38.4  Platelets 150 - 400 K/uL 231 303 282    . CMP Latest Ref Rng & Units 10/22/2019 10/08/2019 09/20/2019  Glucose 70 - 99 mg/dL 106(H) 84 87  BUN 6 - 20 mg/dL 14 11 10   Creatinine 0.44 - 1.00 mg/dL 0.73 0.74 0.67  Sodium 135 - 145 mmol/L 142 144 140  Potassium 3.5 - 5.1 mmol/L 3.7 4.0 3.7  Chloride 98 - 111 mmol/L 110 107 105  CO2 22 - 32 mmol/L 24 26 25   Calcium 8.9 - 10.3 mg/dL 8.5(L) 9.4 9.4  Total Protein 6.5 - 8.1 g/dL 6.3(L) 6.9 6.8  Total Bilirubin 0.3 - 1.2 mg/dL 0.5 0.9 0.8  Alkaline Phos 38 - 126 U/L 53 53 40  AST 15 - 41 U/L 12(L) 13(L) 17  ALT 0 - 44 U/L 19 18 23    . Lab Results  Component Value Date   IRON 79 07/09/2019   TIBC 221 (L) 07/09/2019   IRONPCTSAT 36 07/09/2019   (Iron and TIBC)  Lab Results  Component Value Date   FERRITIN 590 (H) 07/09/2019  08/13/2019 NM PET Image Restag (PS) Skull Base To Thigh (Accession [id number])   05/21/2019 [id number]) Lymph Node Biopsy    RADIOGRAPHIC STUDIES: I have personally reviewed the radiological images as listed and agreed with the findings in the report. No results found.  ASSESSMENT & PLAN:   1) Recently diagnosed Classical Hodgkins lymphoma Likely  stage IIIB based on imaging thus far. 2) Microcytic Anemia due to anemia of chronic disease and Iron deficiency- hg improved to 13 3) Jehovah's Witness   PLAN: -Discussed pt labwork today, 10/22/19; all values are WNL except for RBC at 3.82, Glucose Bld at 106, Calcium at 8.5, total protein at 6.3, AST at 12. -Recommended not using blades for shaving, wearing loose clothing to prevent irritation to abscess  -no prohibitive tox from chemotherapy at this time -Will proceed with Cycle 5 day one of treatment.  FOLLOW UP: Please schedule remaining cycle 5-day 15 and cycle 6-day 1 and day 15 with port flush and labs with each treatment.  Patient has Granix daily for 3 days after each treatment. MD visit in 4 weeks with cycle 6-day 1  The total time spent in the appt was 25 minutes and more than 50% was on counseling and direct patient cares.  All of the patient's questions were answered with apparent satisfaction. The patient knows to call the clinic with any problems, questions or concerns.     Sullivan Lone MD MS AAHIVMS [facility] Hematology/Oncology Physician [facility]  (Office):       [phone] (Work cell):  [phone] (Fax):           [phone]  10/22/2019 8:59 AM I, Scot Dock, am acting as a scribe for Dr. Sullivan Lone.   .I have reviewed the above documentation for accuracy and completeness, and I agree with the above. Suzan Slick  Juleen China MD

## 2019-10-23 ENCOUNTER — Other Ambulatory Visit: Payer: Self-pay

## 2019-10-23 ENCOUNTER — Inpatient Hospital Stay: Payer: No Typology Code available for payment source

## 2019-10-23 VITALS — BP 130/72 | HR 86 | Temp 98.2°F | Resp 18

## 2019-10-23 DIAGNOSIS — Z5111 Encounter for antineoplastic chemotherapy: Secondary | ICD-10-CM | POA: Diagnosis not present

## 2019-10-23 MED ORDER — FILGRASTIM-SNDZ 480 MCG/0.8ML IJ SOSY
PREFILLED_SYRINGE | INTRAMUSCULAR | Status: AC
  Filled 2019-10-23: qty 0.8

## 2019-10-23 MED ORDER — FILGRASTIM-SNDZ 480 MCG/0.8ML IJ SOSY
480.0000 ug | PREFILLED_SYRINGE | Freq: Once | INTRAMUSCULAR | Status: AC
  Administered 2019-10-23: 480 ug via SUBCUTANEOUS

## 2019-10-23 NOTE — Patient Instructions (Signed)

## 2019-10-25 ENCOUNTER — Other Ambulatory Visit: Payer: Self-pay

## 2019-10-25 ENCOUNTER — Telehealth: Payer: Self-pay | Admitting: *Deleted

## 2019-10-25 ENCOUNTER — Inpatient Hospital Stay: Payer: No Typology Code available for payment source

## 2019-10-25 VITALS — BP 122/72 | HR 78 | Temp 98.2°F | Resp 18

## 2019-10-25 DIAGNOSIS — Z5111 Encounter for antineoplastic chemotherapy: Secondary | ICD-10-CM | POA: Diagnosis not present

## 2019-10-25 DIAGNOSIS — Z7189 Other specified counseling: Secondary | ICD-10-CM

## 2019-10-25 DIAGNOSIS — C8111 Nodular sclerosis classical Hodgkin lymphoma, lymph nodes of head, face, and neck: Secondary | ICD-10-CM

## 2019-10-25 MED ORDER — FILGRASTIM-SNDZ 480 MCG/0.8ML IJ SOSY
480.0000 ug | PREFILLED_SYRINGE | Freq: Once | INTRAMUSCULAR | Status: AC
  Administered 2019-10-25: 16:00:00 480 ug via SUBCUTANEOUS

## 2019-10-25 NOTE — Telephone Encounter (Signed)
Had a few hard BMs, one of the stools had small amount blood in it. She thinks it is probably hemorrhoids. Had not been eating prunes as she had been doing previously. Took a colace today and will continue with fruits. Will call if has any further bleeding.

## 2019-10-26 ENCOUNTER — Inpatient Hospital Stay: Payer: No Typology Code available for payment source

## 2019-10-26 ENCOUNTER — Telehealth: Payer: Self-pay | Admitting: Hematology

## 2019-10-26 VITALS — BP 110/66 | HR 92 | Temp 97.7°F | Resp 18

## 2019-10-26 DIAGNOSIS — Z7189 Other specified counseling: Secondary | ICD-10-CM

## 2019-10-26 DIAGNOSIS — C8111 Nodular sclerosis classical Hodgkin lymphoma, lymph nodes of head, face, and neck: Secondary | ICD-10-CM

## 2019-10-26 DIAGNOSIS — Z5111 Encounter for antineoplastic chemotherapy: Secondary | ICD-10-CM | POA: Diagnosis not present

## 2019-10-26 MED ORDER — FILGRASTIM-SNDZ 480 MCG/0.8ML IJ SOSY
480.0000 ug | PREFILLED_SYRINGE | Freq: Once | INTRAMUSCULAR | Status: AC
  Administered 2019-10-26: 480 ug via SUBCUTANEOUS

## 2019-10-26 NOTE — Telephone Encounter (Signed)
Scheduled per 02/05 los, patient has been called and notified. 

## 2019-10-26 NOTE — Patient Instructions (Signed)

## 2019-11-04 ENCOUNTER — Encounter: Payer: Self-pay | Admitting: *Deleted

## 2019-11-04 ENCOUNTER — Other Ambulatory Visit: Payer: Self-pay | Admitting: *Deleted

## 2019-11-04 ENCOUNTER — Telehealth: Payer: Self-pay | Admitting: Hematology

## 2019-11-04 ENCOUNTER — Telehealth: Payer: Self-pay | Admitting: *Deleted

## 2019-11-04 NOTE — Telephone Encounter (Signed)
Patient states husband works in environment with multiple co-workers who have covid or have had it. Since she is undergoing chemo for cancer, her husband would like to limit exposure to his co-workers. She would like a letter from Dr. Irene Limbo stating she is undergoing chemo, is immunocompromised and more susceptible to infection and as such should minimize her exposure to potential infectious sources.  Letter prepared as per Dr. Irene Limbo direction and signed by him.

## 2019-11-04 NOTE — Telephone Encounter (Signed)
Rescheduled 2/19 appt due to inclement weather and delay. Called and spoke with patient. Confirmed new time

## 2019-11-05 ENCOUNTER — Inpatient Hospital Stay: Payer: No Typology Code available for payment source

## 2019-11-05 ENCOUNTER — Other Ambulatory Visit: Payer: Self-pay

## 2019-11-05 ENCOUNTER — Other Ambulatory Visit: Payer: No Typology Code available for payment source

## 2019-11-05 ENCOUNTER — Inpatient Hospital Stay: Payer: No Typology Code available for payment source | Admitting: Hematology

## 2019-11-05 VITALS — BP 122/68 | HR 88 | Temp 98.3°F | Resp 16 | Ht 64.0 in | Wt 206.0 lb

## 2019-11-05 DIAGNOSIS — C8118 Nodular sclerosis classical Hodgkin lymphoma, lymph nodes of multiple sites: Secondary | ICD-10-CM

## 2019-11-05 DIAGNOSIS — D709 Neutropenia, unspecified: Secondary | ICD-10-CM

## 2019-11-05 DIAGNOSIS — Z5111 Encounter for antineoplastic chemotherapy: Secondary | ICD-10-CM

## 2019-11-05 DIAGNOSIS — Z7189 Other specified counseling: Secondary | ICD-10-CM

## 2019-11-05 DIAGNOSIS — C8111 Nodular sclerosis classical Hodgkin lymphoma, lymph nodes of head, face, and neck: Secondary | ICD-10-CM

## 2019-11-05 LAB — CMP (CANCER CENTER ONLY)
ALT: 21 U/L (ref 0–44)
AST: 13 U/L — ABNORMAL LOW (ref 15–41)
Albumin: 3.7 g/dL (ref 3.5–5.0)
Alkaline Phosphatase: 55 U/L (ref 38–126)
Anion gap: 9 (ref 5–15)
BUN: 10 mg/dL (ref 6–20)
CO2: 26 mmol/L (ref 22–32)
Calcium: 8.9 mg/dL (ref 8.9–10.3)
Chloride: 108 mmol/L (ref 98–111)
Creatinine: 0.68 mg/dL (ref 0.44–1.00)
GFR, Est AFR Am: >60 mL/min (ref >60)
GFR, Estimated: >60 mL/min (ref >60)
Glucose, Bld: 98 mg/dL (ref 70–99)
Potassium: 3.9 mmol/L (ref 3.5–5.1)
Sodium: 143 mmol/L (ref 135–145)
Total Bilirubin: 0.5 mg/dL (ref 0.3–1.2)
Total Protein: 6.5 g/dL (ref 6.5–8.1)

## 2019-11-05 LAB — CBC WITH DIFFERENTIAL/PLATELET
Abs Immature Granulocytes: 0.04 10*3/uL (ref 0.00–0.07)
Basophils Absolute: 0 10*3/uL (ref 0.0–0.1)
Basophils Relative: 1 %
Eosinophils Absolute: 0.1 10*3/uL (ref 0.0–0.5)
Eosinophils Relative: 1 %
HCT: 36.8 % (ref 36.0–46.0)
Hemoglobin: 12.2 g/dL (ref 12.0–15.0)
Immature Granulocytes: 1 %
Lymphocytes Relative: 31 %
Lymphs Abs: 1.9 10*3/uL (ref 0.7–4.0)
MCH: 32.4 pg (ref 26.0–34.0)
MCHC: 33.2 g/dL (ref 30.0–36.0)
MCV: 97.6 fL (ref 80.0–100.0)
Monocytes Absolute: 0.6 10*3/uL (ref 0.1–1.0)
Monocytes Relative: 9 %
Neutro Abs: 3.5 10*3/uL (ref 1.7–7.7)
Neutrophils Relative %: 57 %
Platelets: 237 10*3/uL (ref 150–400)
RBC: 3.77 MIL/uL — ABNORMAL LOW (ref 3.87–5.11)
RDW: 14.6 % (ref 11.5–15.5)
WBC: 6.2 10*3/uL (ref 4.0–10.5)
nRBC: 0 % (ref 0.0–0.2)

## 2019-11-05 MED ORDER — LORAZEPAM 2 MG/ML IJ SOLN
0.5000 mg | Freq: Once | INTRAMUSCULAR | Status: AC
  Administered 2019-11-05: 0.5 mg via INTRAVENOUS

## 2019-11-05 MED ORDER — SODIUM CHLORIDE 0.9% FLUSH
10.0000 mL | INTRAVENOUS | Status: DC | PRN
  Administered 2019-11-05: 10 mL
  Filled 2019-11-05: qty 10

## 2019-11-05 MED ORDER — VINBLASTINE SULFATE CHEMO INJECTION 1 MG/ML
6.0000 mg/m2 | Freq: Once | INTRAVENOUS | Status: AC
  Administered 2019-11-05: 12 mg via INTRAVENOUS
  Filled 2019-11-05: qty 12

## 2019-11-05 MED ORDER — SODIUM CHLORIDE 0.9 % IV SOLN
Freq: Once | INTRAVENOUS | Status: AC
  Filled 2019-11-05: qty 250

## 2019-11-05 MED ORDER — PALONOSETRON HCL INJECTION 0.25 MG/5ML
0.2500 mg | Freq: Once | INTRAVENOUS | Status: AC
  Administered 2019-11-05: 0.25 mg via INTRAVENOUS

## 2019-11-05 MED ORDER — SODIUM CHLORIDE 0.9 % IV SOLN
375.0000 mg/m2 | Freq: Once | INTRAVENOUS | Status: AC
  Administered 2019-11-05: 750 mg via INTRAVENOUS
  Filled 2019-11-05: qty 75

## 2019-11-05 MED ORDER — SODIUM CHLORIDE 0.9 % IV SOLN
Freq: Once | INTRAVENOUS | Status: AC
  Filled 2019-11-05: qty 5

## 2019-11-05 MED ORDER — DOXORUBICIN HCL CHEMO IV INJECTION 2 MG/ML
25.0000 mg/m2 | Freq: Once | INTRAVENOUS | Status: AC
  Administered 2019-11-05: 50 mg via INTRAVENOUS
  Filled 2019-11-05: qty 25

## 2019-11-05 MED ORDER — LORAZEPAM 2 MG/ML IJ SOLN
INTRAMUSCULAR | Status: AC
  Filled 2019-11-05: qty 1

## 2019-11-05 MED ORDER — HEPARIN SOD (PORK) LOCK FLUSH 100 UNIT/ML IV SOLN
500.0000 [IU] | Freq: Once | INTRAVENOUS | Status: AC | PRN
  Administered 2019-11-05: 500 [IU]
  Filled 2019-11-05: qty 5

## 2019-11-05 MED ORDER — PALONOSETRON HCL INJECTION 0.25 MG/5ML
INTRAVENOUS | Status: AC
  Filled 2019-11-05: qty 5

## 2019-11-05 NOTE — Patient Instructions (Signed)

## 2019-11-05 NOTE — Progress Notes (Incomplete)
HEMATOLOGY/ONCOLOGY CLINIC NOTE  Date of Service: 11/05/19   Patient Care Team: [name], MD as PCP - General (Family Medicine)  CHIEF COMPLAINTS/PURPOSE OF CONSULTATION:   Continue management of classical hodgkins lymphoma   CURRENT TREATMENT: Cycle 4 day 15 with granix support   HISTORY OF PRESENTING ILLNESS:   Elizabeth Beasley is a wonderful 39 y.o. female who has been referred to [place] by Dr Madilyn Fireman for evaluation and management of microcytic anemia/cervical lymphadenopathy.The pt reports that she has a history of iron deficiency but has never had a transfusion. Pt has had two pregnancies and has two children, ages 89 and 66. There was a concern for anemia with her second pregnancy so she was given an erythropoietin shot. She has had a copper IUD for about 2 years and her menstrual cycle has been slightly longer since getting the IUD. She has been taking 28 mg TID PO iron since June. Pt has no known medication allergies and has had a previous removal of Bartholin's gland cyst. Pt has had no known contact with chemicals either through work or hobbies. She has no history of asthma or heavy smoke/aerosol exposure.   Pt did not notice any cervical lymphadenopathy in June. In late July, early August she began to experience extreme fatigue and could begin to feel small lumps in her neck. Her fatigue is improved with taking her PO Iron TID. She has experienced a couple of episodes of night sweats, with the latest occurring in late August. Pt has also had <10 lbs loss in 2 months. She has not had any rashes but is experiencing itching in her palms, broader itching after hot showers.   Pt is currently working from home. She has had her seasonal flu shot. Pt is not planning on having more children and is not concerned about fertility. Pt's husband would like to take a drive to the [facility] next month. Pt would not want any blood product transfusions, as she is a Jehovah's  Witness. She is comfortable with IV iron infusions.   INTERVAL HISTORY:  Elizabeth SCHWITTERS is a 39 y.o. female here for evaluation and management of hodgkins lymphoma. The patient's last visit with [place] was on 10/22/2019. She is here for C5D15 of ***. The pt reports that she is doing well overall.  The pt reports ***  Of note since the patient's last visit, pt has had *** completed on *** with results revealing ***.  Lab results today (11/05/19) of CBC w/diff and CMP is as follows: all values are WNL except for ***.  On review of systems, pt reports *** and denies ***and any other symptoms.   A&P: -Discussed pt labwork today, 11/05/19; *** -***  MEDICAL HISTORY:  Past Medical History:  Diagnosis Date  . Anemia   . Bartholin gland cyst   . 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 Hx of seasonal allergies   . Lymphadenopathy, cervical 05/21/2019  . PONV (postoperative nausea and vomiting)   . 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 CESAREAN SECTION  2010  . MASS EXCISION Right 05/21/2019   Procedure: EXCISION OF RIGHT DEEP NECK LYMPH NODE;  Surgeon: Fanny Skates, MD;  Location: [facility];  Service: General;  Laterality: Right;  . PORTACATH PLACEMENT Right 06/08/2019   Procedure: INSERTION PORT-A-CATH WITH ULTRASOUND GUIDANCE;  Surgeon: Fanny Skates, MD;  Location: [facility];  Service: General;  Laterality: Right;  . URETHRAL CYST REMOVAL N/A 08/30/2016   Procedure: BARTHOLIN'S GLAND CYST EXCISION;  Surgeon: Ardis Hughs, MD;  Location: [facility];  Service: Urology;  Laterality: N/A;  . v back     2012    SOCIAL HISTORY: Social History   Socioeconomic History  . Marital status: Married    Spouse name: Montine Circle  . Number of children: 2  . Years of education: Assoc  . Highest education level: Not on file  Occupational  History  . Occupation: CMA    Employer: [employer]  Tobacco Use  . Smoking status: Never Smoker  . Smokeless tobacco: Never Used  Substance and Sexual Activity  . Alcohol use: Not Currently    Comment: ocassional  . Drug use: No  . Sexual activity: Yes    Partners: Male    Birth control/protection: I.U.D.  Other Topics Concern  . Not on file  Social History Narrative   Regular exercise. No regular caffeine.   Social Determinants of Health   Financial Resource Strain:   . Difficulty of Paying Living Expenses: Not on file  Food Insecurity:   . Worried About Charity fundraiser in the Last Year: Not on file  . Ran Out of Food in the Last Year: Not on file  Transportation Needs:   . Lack of Transportation (Medical): Not on file  . Lack of Transportation (Non-Medical): Not on file  Physical Activity:   . Days of Exercise per Week: Not on file  . Minutes of Exercise per Session: Not on file  Stress:   . Feeling of Stress : Not on file  Social Connections:   . Frequency of Communication with Friends and Family: Not on file  . Frequency of Social Gatherings with Friends and Family: Not on file  . Attends Religious Services: Not on file  . Active Member of Clubs or Organizations: Not on file  . Attends Archivist Meetings: Not on file  . Marital Status: Not on file  Intimate Partner Violence:   . Fear of Current or Ex-Partner: Not on file  . Emotionally Abused: Not on file  . Physically Abused: Not on file  . Sexually Abused: Not on file    FAMILY HISTORY: Family History  Problem Relation Age of Onset  . Colon cancer Father 11  . Colon polyps Mother   . Hypertension Mother   . Diabetes Paternal Grandmother   . Stroke Paternal Grandmother   . Alcohol abuse Paternal Uncle   . Colon cancer Paternal Aunt 72  . Esophageal cancer Neg Hx   . Rectal cancer Neg Hx   . Stomach cancer Neg Hx    On PMHx the pt reports C-section, Bartholin's Gland Cyst removal. On  Social Hx the pt reports non smoker, no alcohol use. On Family Hx the pt reports father passed from colon cancer, mother has colon polyps.  ALLERGIES:  has No Known Allergies.  MEDICATIONS:  Current Outpatient Medications  Medication Sig Dispense Refill  . b complex vitamins capsule Take 1 capsule by mouth daily.    . cholecalciferol (VITAMIN D3) 25 MCG (1000 UT) tablet Take 2,000 Units by mouth daily.    Marland Kitchen dexamethasone (DECADRON) 4 MG tablet Take 2 tablets by mouth once a day on the day after chemotherapy and then take 2 tablets two times a day for 2 days. Take with food. 30 tablet 1  . fexofenadine (ALLEGRA)  180 MG tablet Take 180 mg by mouth daily as needed for allergies or rhinitis.    . fluconazole (DIFLUCAN) 150 MG tablet Take 1 tablet for a yeast infection if needed, may repeat in 3 days if needed 2 tablet 0  . levofloxacin (LEVAQUIN) 750 MG tablet Take 1 tablet (750 mg total) by mouth daily. (Patient not taking: Reported on 10/22/2019) 14 tablet 0  . LORazepam (ATIVAN) 0.5 MG tablet Take 1 tablet (0.5 mg total) by mouth every 6 (six) hours as needed (Nausea or vomiting). 30 tablet 0  . mupirocin ointment (BACTROBAN) 2 % Place 1 application into the nose 2 (two) times daily. 22 g 1  . ondansetron (ZOFRAN) 8 MG tablet Take 1 tablet (8 mg total) by mouth 2 (two) times daily as needed. Start on the third day after chemotherapy. 30 tablet 1  . potassium chloride SA (KLOR-CON) 20 MEQ tablet Take 1 tablet (20 mEq total) by mouth 2 (two) times daily. 60 tablet 1  . prochlorperazine (COMPAZINE) 10 MG tablet Take 1 tablet (10 mg total) by mouth every 6 (six) hours as needed (Nausea or vomiting). 30 tablet 1  . sulfamethoxazole-trimethoprim (BACTRIM DS) 800-160 MG tablet Take 1 tablet by mouth 2 (two) times daily. (Patient not taking: Reported on 10/08/2019) 14 tablet 0   No current facility-administered medications for this visit.   Facility-Administered Medications Ordered in Other Visits    Medication Dose Route Frequency Provider Last Rate Last Admin  . sodium chloride flush (NS) 0.9 % injection 10 mL  10 mL Intracatheter PRN Brunetta Genera, MD   10 mL at 08/05/19 1658    REVIEW OF SYSTEMS:   A 10+ POINT REVIEW OF SYSTEMS WAS OBTAINED including neurology, dermatology, psychiatry, cardiac, respiratory, lymph, extremities, GI, GU, Musculoskeletal, constitutional, breasts, reproductive, HEENT.  All pertinent positives are noted in the HPI.  All others are negative.   PHYSICAL EXAMINATION: ECOG FS:1 - Symptomatic but completely ambulatory  There were no vitals filed for this visit. Wt Readings from Last 3 Encounters:  10/22/19 206 lb 12.8 oz (93.8 kg)  10/08/19 204 lb 8 oz (92.8 kg)  09/20/19 204 lb (92.5 kg)   There is no height or weight on file to calculate BMI.    .*** GENERAL:alert, in no acute distress and comfortable SKIN: no acute rashes, no significant lesions EYES: conjunctiva are pink and non-injected, sclera anicteric OROPHARYNX: MMM, no exudates, no oropharyngeal erythema or ulceration NECK: supple, no JVD LYMPH:  no palpable lymphadenopathy in the cervical, axillary or inguinal regions LUNGS: clear to auscultation b/l with normal respiratory effort HEART: regular rate & rhythm ABDOMEN:  normoactive bowel sounds , non tender, not distended. No palpable hepatosplenomegaly.  Extremity: no pedal edema PSYCH: alert & oriented x 3 with fluent speech NEURO: no focal motor/sensory deficits   LABORATORY DATA:  I have reviewed the data as listed  - 05/14/2019 Fe+TIBC+Fer is as follows: Iron at 11, TIBC at 258, % Sat at 4, Ferritin at 50. -Discussed 05/21/2019 Lymph Node Bx [id number]) which revealed " CLASSIC HODGKIN LYMPHOMA."  - 05/19/2019 CT C/A/P ([id number]) which revealed  "1. Enlarged mediastinal, bilateral hilar and lower cervical lymph nodes. Primary considerations include lymphoma, sarcoid and less likely metastatic disease. Normal spleen. No  evidence of enlarged lymph nodes within the abdomen or pelvis. 2. Low lying IUD within the LOWER uterine segment and may extend to the cervix."  - 05/17/2019 CT Soft Tissue Neck w/contrast ([id number]) which revealed "Bulky right greater than left  cervical and mediastinal lymphadenopathy concerning for lymphoma."  . CBC Latest Ref Rng & Units 10/22/2019 10/08/2019 09/20/2019  WBC 4.0 - 10.5 K/uL 6.1 6.2 8.1  Hemoglobin 12.0 - 15.0 g/dL 12.3 13.0 12.8  Hematocrit 36.0 - 46.0 % 37.2 39.0 38.4  Platelets 150 - 400 K/uL 231 303 282    . CMP Latest Ref Rng & Units 10/22/2019 10/08/2019 09/20/2019  Glucose 70 - 99 mg/dL 106(H) 84 87  BUN 6 - 20 mg/dL 14 11 10   Creatinine 0.44 - 1.00 mg/dL 0.73 0.74 0.67  Sodium 135 - 145 mmol/L 142 144 140  Potassium 3.5 - 5.1 mmol/L 3.7 4.0 3.7  Chloride 98 - 111 mmol/L 110 107 105  CO2 22 - 32 mmol/L 24 26 25   Calcium 8.9 - 10.3 mg/dL 8.5(L) 9.4 9.4  Total Protein 6.5 - 8.1 g/dL 6.3(L) 6.9 6.8  Total Bilirubin 0.3 - 1.2 mg/dL 0.5 0.9 0.8  Alkaline Phos 38 - 126 U/L 53 53 40  AST 15 - 41 U/L 12(L) 13(L) 17  ALT 0 - 44 U/L 19 18 23    . Lab Results  Component Value Date   IRON 79 07/09/2019   TIBC 221 (L) 07/09/2019   IRONPCTSAT 36 07/09/2019   (Iron and TIBC)  Lab Results  Component Value Date   FERRITIN 590 (H) 07/09/2019  08/13/2019 NM PET Image Restag (PS) Skull Base To Thigh (Accession [id number])   05/21/2019 [id number]) Lymph Node Biopsy    RADIOGRAPHIC STUDIES: I have personally reviewed the radiological images as listed and agreed with the findings in the report. No results found.  ASSESSMENT & PLAN:   1) Recently diagnosed Classical Hodgkins lymphoma Likely stage IIIB based on imaging thus far. 2) Microcytic Anemia due to anemia of chronic disease and Iron deficiency- hg improved to 13 3) Jehovah's Witness   PLAN: *** -Recommended not using blades for shaving, wearing loose clothing to prevent irritation to abscess  -The pt had no  prohibitive toxicities from chemotherapy at this time***  FOLLOW UP: ***  The total time spent in the appt was *** minutes and more than 50% was on counseling and direct patient cares.  All of the patient's questions were answered with apparent satisfaction. The patient knows to call the clinic with any problems, questions or concerns.  Sullivan Lone MD [place] AAHIVMS [facility] Hematology/Oncology Physician [facility]  (Office):       [phone] (Work cell):  [phone] (Fax):           [phone]  11/05/2019 9:59 AM  I, Yevette Edwards, am acting as a scribe for Dr. Sullivan Lone.   {Add Barista Statement}

## 2019-11-05 NOTE — Patient Instructions (Signed)
[place] Discharge Instructions for Patients Receiving Chemotherapy  Today you received the following chemotherapy agents:  Doxorubicin, Vinblastine, Dacarbazine  To help prevent nausea and vomiting after your treatment, we encourage you to take your nausea medication as prescribed.   If you develop nausea and vomiting that is not controlled by your nausea medication, call the clinic.   BELOW ARE SYMPTOMS THAT SHOULD BE REPORTED IMMEDIATELY:  *FEVER GREATER THAN 100.5 F  *CHILLS WITH OR WITHOUT FEVER  NAUSEA AND VOMITING THAT IS NOT CONTROLLED WITH YOUR NAUSEA MEDICATION  *UNUSUAL SHORTNESS OF BREATH  *UNUSUAL BRUISING OR BLEEDING  TENDERNESS IN MOUTH AND THROAT WITH OR WITHOUT PRESENCE OF ULCERS  *URINARY PROBLEMS  *BOWEL PROBLEMS  UNUSUAL RASH Items with * indicate a potential emergency and should be followed up as soon as possible.  Feel free to call the clinic should you have any questions or concerns. The clinic phone number is [phone].  Please show the [place] at check-in to the Emergency Department and triage nurse.

## 2019-11-06 ENCOUNTER — Inpatient Hospital Stay: Payer: No Typology Code available for payment source

## 2019-11-06 VITALS — BP 123/70 | HR 102 | Temp 98.3°F | Resp 16

## 2019-11-06 DIAGNOSIS — Z7189 Other specified counseling: Secondary | ICD-10-CM

## 2019-11-06 DIAGNOSIS — C8111 Nodular sclerosis classical Hodgkin lymphoma, lymph nodes of head, face, and neck: Secondary | ICD-10-CM

## 2019-11-06 DIAGNOSIS — Z5111 Encounter for antineoplastic chemotherapy: Secondary | ICD-10-CM | POA: Diagnosis not present

## 2019-11-06 MED ORDER — FILGRASTIM-SNDZ 480 MCG/0.8ML IJ SOSY
480.0000 ug | PREFILLED_SYRINGE | Freq: Once | INTRAMUSCULAR | Status: AC
  Administered 2019-11-06: 480 ug via SUBCUTANEOUS

## 2019-11-06 MED ORDER — FILGRASTIM-SNDZ 480 MCG/0.8ML IJ SOSY
PREFILLED_SYRINGE | INTRAMUSCULAR | Status: AC
  Filled 2019-11-06: qty 0.8

## 2019-11-06 NOTE — Patient Instructions (Signed)

## 2019-11-08 ENCOUNTER — Inpatient Hospital Stay: Payer: No Typology Code available for payment source

## 2019-11-08 ENCOUNTER — Other Ambulatory Visit: Payer: Self-pay

## 2019-11-08 VITALS — BP 105/81 | HR 79 | Temp 98.0°F | Resp 18

## 2019-11-08 DIAGNOSIS — Z7189 Other specified counseling: Secondary | ICD-10-CM

## 2019-11-08 DIAGNOSIS — C8111 Nodular sclerosis classical Hodgkin lymphoma, lymph nodes of head, face, and neck: Secondary | ICD-10-CM

## 2019-11-08 DIAGNOSIS — Z5111 Encounter for antineoplastic chemotherapy: Secondary | ICD-10-CM | POA: Diagnosis not present

## 2019-11-08 MED ORDER — FILGRASTIM-SNDZ 480 MCG/0.8ML IJ SOSY
PREFILLED_SYRINGE | INTRAMUSCULAR | Status: AC
  Filled 2019-11-08: qty 0.8

## 2019-11-08 MED ORDER — FILGRASTIM-SNDZ 480 MCG/0.8ML IJ SOSY
480.0000 ug | PREFILLED_SYRINGE | Freq: Once | INTRAMUSCULAR | Status: AC
  Administered 2019-11-08: 480 ug via SUBCUTANEOUS

## 2019-11-08 NOTE — Patient Instructions (Signed)

## 2019-11-09 ENCOUNTER — Inpatient Hospital Stay: Payer: No Typology Code available for payment source

## 2019-11-09 VITALS — BP 111/62 | HR 91 | Temp 98.1°F | Resp 18

## 2019-11-09 DIAGNOSIS — C8111 Nodular sclerosis classical Hodgkin lymphoma, lymph nodes of head, face, and neck: Secondary | ICD-10-CM

## 2019-11-09 DIAGNOSIS — Z7189 Other specified counseling: Secondary | ICD-10-CM

## 2019-11-09 DIAGNOSIS — Z5111 Encounter for antineoplastic chemotherapy: Secondary | ICD-10-CM | POA: Diagnosis not present

## 2019-11-09 MED ORDER — FILGRASTIM-SNDZ 480 MCG/0.8ML IJ SOSY
480.0000 ug | PREFILLED_SYRINGE | Freq: Once | INTRAMUSCULAR | Status: AC
  Administered 2019-11-09: 480 ug via SUBCUTANEOUS

## 2019-11-09 MED ORDER — FILGRASTIM-SNDZ 480 MCG/0.8ML IJ SOSY
PREFILLED_SYRINGE | INTRAMUSCULAR | Status: AC
  Filled 2019-11-09: qty 0.8

## 2019-11-15 ENCOUNTER — Telehealth: Payer: Self-pay | Admitting: *Deleted

## 2019-11-15 NOTE — Telephone Encounter (Signed)
Pt called: Experienced headache last week post Granix (2/23) only on L side of head-pain came in bursts.Following headache, L eye felt like pressure behind it and sore feeling when she moved it. She asked if she should be concerned and/or get it checked? She has treatment on Friday and didn't know if that could delay it. Dr. Irene Limbo informed. Dr. Grier Mitts response: She should be evaluated for the symptom to r/o any infectious issue like orbital cellulitis and to r/o issues with increased eye pressure -- does she have an eye doctor she sees... that would be ideal. Contacted patient with Dr. Grier Mitts recommendation: She verbalized understanding and states she will f/u with eye doctor (must see a new one as insurance changed this year) or will contact PCP if it takes a while to get in with eye doctor.

## 2019-11-16 MED FILL — LOTEMAX SM 0.38 % GEL: 0.38 | 16 days supply | Qty: 5 | Fill #0

## 2019-11-16 MED FILL — DEXAMETHASONE 4 MG TABLET: 4 | 8 days supply | Qty: 30 | Fill #1

## 2019-11-18 NOTE — Progress Notes (Signed)
HEMATOLOGY/ONCOLOGY CLINIC NOTE  Date of Service: 11/18/19   Patient Care Team: [name], MD as PCP - General (Family Medicine)  CHIEF COMPLAINTS/PURPOSE OF CONSULTATION:   Continue management of classical hodgkins lymphoma   CURRENT TREATMENT: Cycle 4 day 15 with granix support   HISTORY OF PRESENTING ILLNESS:   Elizabeth Beasley is a wonderful 39 y.o. female who has been referred to [place] by Dr Madilyn Fireman for evaluation and management of microcytic anemia/cervical lymphadenopathy.The pt reports that she has a history of iron deficiency but has never had a transfusion. Pt has had two pregnancies and has two children, ages 20 and 59. There was a concern for anemia with her second pregnancy so she was given an erythropoietin shot. She has had a copper IUD for about 2 years and her menstrual cycle has been slightly longer since getting the IUD. She has been taking 28 mg TID PO iron since June. Pt has no known medication allergies and has had a previous removal of Bartholin's gland cyst. Pt has had no known contact with chemicals either through work or hobbies. She has no history of asthma or heavy smoke/aerosol exposure.   Pt did not notice any cervical lymphadenopathy in June. In late July, early August she began to experience extreme fatigue and could begin to feel small lumps in her neck. Her fatigue is improved with taking her PO Iron TID. She has experienced a couple of episodes of night sweats, with the latest occurring in late August. Pt has also had <10 lbs loss in 2 months. She has not had any rashes but is experiencing itching in her palms, broader itching after hot showers.   Pt is currently working from home. She has had her seasonal flu shot. Pt is not planning on having more children and is not concerned about fertility. Pt's husband would like to take a drive to the [facility] next month. Pt would not want any blood product transfusions, as she is a Jehovah's  Witness. She is comfortable with IV iron infusions.   INTERVAL HISTORY:  Elizabeth Beasley is a 39 y.o. female here for evaluation and management of hodgkins lymphoma. The patient's last visit with [place] was on 10/22/2019. The pt reports that she is doing well overall.  The pt reports she is doing well. She reports that she had a headache on the left side in episodes and not constant. After her eye became sore from looking around. There was no change in vision, discharge from eye, or runny nose. Pt did have pressure across nose. Pt's eye is now feeling 95% better today. Pt has been doing aerobic workout videos for 15 minutes twice a day.  Lab results today (11/19/19) of CBC w/diff and CMP is as follows: all values are WNL except for RBC at 3.77, AST at 11.  On review of systems, pt reports tingling/numbness in hands and feet, fatigue, headache, eye pain and denies rash, fever, chills, night sweats, and any other symptoms.    MEDICAL HISTORY:  Past Medical History:  Diagnosis Date  . Anemia   . Bartholin gland cyst   . 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 Hx of seasonal allergies   . Lymphadenopathy, cervical 05/21/2019  . PONV (postoperative nausea and vomiting)   . 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 CESAREAN SECTION  2010  .  MASS EXCISION Right 05/21/2019   Procedure: EXCISION OF RIGHT DEEP NECK LYMPH NODE;  Surgeon: Fanny Skates, MD;  Location: [facility];  Service: General;  Laterality: Right;  . PORTACATH PLACEMENT Right 06/08/2019   Procedure: INSERTION PORT-A-CATH WITH ULTRASOUND GUIDANCE;  Surgeon: Fanny Skates, MD;  Location: [facility];  Service: General;  Laterality: Right;  . URETHRAL CYST REMOVAL N/A 08/30/2016   Procedure: BARTHOLIN'S GLAND CYST EXCISION;  Surgeon: Ardis Hughs, MD;  Location: [facility];  Service:  Urology;  Laterality: N/A;  . v back     2012    SOCIAL HISTORY: Social History   Socioeconomic History  . Marital status: Married    Spouse name: Montine Circle  . Number of children: 2  . Years of education: Assoc  . Highest education level: Not on file  Occupational History  . Occupation: CMA    Employer: [employer]  Tobacco Use  . Smoking status: Never Smoker  . Smokeless tobacco: Never Used  Substance and Sexual Activity  . Alcohol use: Not Currently    Comment: ocassional  . Drug use: No  . Sexual activity: Yes    Partners: Male    Birth control/protection: I.U.D.  Other Topics Concern  . Not on file  Social History Narrative   Regular exercise. No regular caffeine.   Social Determinants of Health   Financial Resource Strain:   . Difficulty of Paying Living Expenses: Not on file  Food Insecurity:   . Worried About Charity fundraiser in the Last Year: Not on file  . Ran Out of Food in the Last Year: Not on file  Transportation Needs:   . Lack of Transportation (Medical): Not on file  . Lack of Transportation (Non-Medical): Not on file  Physical Activity:   . Days of Exercise per Week: Not on file  . Minutes of Exercise per Session: Not on file  Stress:   . Feeling of Stress : Not on file  Social Connections:   . Frequency of Communication with Friends and Family: Not on file  . Frequency of Social Gatherings with Friends and Family: Not on file  . Attends Religious Services: Not on file  . Active Member of Clubs or Organizations: Not on file  . Attends Archivist Meetings: Not on file  . Marital Status: Not on file  Intimate Partner Violence:   . Fear of Current or Ex-Partner: Not on file  . Emotionally Abused: Not on file  . Physically Abused: Not on file  . Sexually Abused: Not on file    FAMILY HISTORY: Family History  Problem Relation Age of Onset  . Colon cancer Father 67  . Colon polyps Mother   . Hypertension Mother   . Diabetes Paternal  Grandmother   . Stroke Paternal Grandmother   . Alcohol abuse Paternal Uncle   . Colon cancer Paternal Aunt 25  . Esophageal cancer Neg Hx   . Rectal cancer Neg Hx   . Stomach cancer Neg Hx    On PMHx the pt reports C-section, Bartholin's Gland Cyst removal. On Social Hx the pt reports non smoker, no alcohol use. On Family Hx the pt reports father passed from colon cancer, mother has colon polyps.  ALLERGIES:  has No Known Allergies.  MEDICATIONS:  Current Outpatient Medications  Medication Sig Dispense Refill  . b complex vitamins capsule Take 1 capsule by mouth daily.    . cholecalciferol (VITAMIN D3) 25 MCG (1000 UT) tablet Take  2,000 Units by mouth daily.    Marland Kitchen dexamethasone (DECADRON) 4 MG tablet Take 2 tablets by mouth once a day on the day after chemotherapy and then take 2 tablets two times a day for 2 days. Take with food. 30 tablet 1  . fexofenadine (ALLEGRA) 180 MG tablet Take 180 mg by mouth daily as needed for allergies or rhinitis.    . fluconazole (DIFLUCAN) 150 MG tablet Take 1 tablet for a yeast infection if needed, may repeat in 3 days if needed 2 tablet 0  . levofloxacin (LEVAQUIN) 750 MG tablet Take 1 tablet (750 mg total) by mouth daily. (Patient not taking: Reported on 10/22/2019) 14 tablet 0  . LORazepam (ATIVAN) 0.5 MG tablet Take 1 tablet (0.5 mg total) by mouth every 6 (six) hours as needed (Nausea or vomiting). 30 tablet 0  . mupirocin ointment (BACTROBAN) 2 % Place 1 application into the nose 2 (two) times daily. 22 g 1  . ondansetron (ZOFRAN) 8 MG tablet Take 1 tablet (8 mg total) by mouth 2 (two) times daily as needed. Start on the third day after chemotherapy. 30 tablet 1  . potassium chloride SA (KLOR-CON) 20 MEQ tablet Take 1 tablet (20 mEq total) by mouth 2 (two) times daily. 60 tablet 1  . prochlorperazine (COMPAZINE) 10 MG tablet Take 1 tablet (10 mg total) by mouth every 6 (six) hours as needed (Nausea or vomiting). 30 tablet 1  .  sulfamethoxazole-trimethoprim (BACTRIM DS) 800-160 MG tablet Take 1 tablet by mouth 2 (two) times daily. (Patient not taking: Reported on 10/08/2019) 14 tablet 0   No current facility-administered medications for this visit.   Facility-Administered Medications Ordered in Other Visits  Medication Dose Route Frequency Provider Last Rate Last Admin  . sodium chloride flush (NS) 0.9 % injection 10 mL  10 mL Intracatheter PRN Brunetta Genera, MD   10 mL at 08/05/19 1658    REVIEW OF SYSTEMS:   A 10+ POINT REVIEW OF SYSTEMS WAS OBTAINED including neurology, dermatology, psychiatry, cardiac, respiratory, lymph, extremities, GI, GU, Musculoskeletal, constitutional, breasts, reproductive, HEENT.  All pertinent positives are noted in the HPI.  All others are negative.    PHYSICAL EXAMINATION: ECOG FS:1 - Symptomatic but completely ambulatory  There were no vitals filed for this visit. Wt Readings from Last 3 Encounters:  11/05/19 206 lb (93.4 kg)  10/22/19 206 lb 12.8 oz (93.8 kg)  10/08/19 204 lb 8 oz (92.8 kg)   There is no height or weight on file to calculate BMI.      GENERAL:alert, in no acute distress and comfortable SKIN: no acute rashes, no significant lesions EYES: conjunctiva are pink and non-injected, sclera anicteric OROPHARYNX: MMM, no exudates, no oropharyngeal erythema or ulceration NECK: supple, no JVD LYMPH:  no palpable lymphadenopathy in the cervical, axillary or inguinal regions LUNGS: clear to auscultation b/l with normal respiratory effort HEART: regular rate & rhythm ABDOMEN:  normoactive bowel sounds , non tender, not distended. Extremity: no pedal edema PSYCH: alert & oriented x 3 with fluent speech NEURO: no focal motor/sensory deficits    LABORATORY DATA:  I have reviewed the data as listed  - 05/14/2019 Fe+TIBC+Fer is as follows: Iron at 11, TIBC at 258, % Sat at 4, Ferritin at 50. -Discussed 05/21/2019 Lymph Node Bx [id number]) which revealed "  CLASSIC HODGKIN LYMPHOMA."  - 05/19/2019 CT C/A/P ([id number]) which revealed  "1. Enlarged mediastinal, bilateral hilar and lower cervical lymph nodes. Primary considerations include lymphoma, sarcoid and less  likely metastatic disease. Normal spleen. No evidence of enlarged lymph nodes within the abdomen or pelvis. 2. Low lying IUD within the LOWER uterine segment and may extend to the cervix."  - 05/17/2019 CT Soft Tissue Neck w/contrast ([id number]) which revealed "Bulky right greater than left cervical and mediastinal lymphadenopathy concerning for lymphoma."  . CBC Latest Ref Rng & Units 11/05/2019 10/22/2019 10/08/2019  WBC 4.0 - 10.5 K/uL 6.2 6.1 6.2  Hemoglobin 12.0 - 15.0 g/dL 12.2 12.3 13.0  Hematocrit 36.0 - 46.0 % 36.8 37.2 39.0  Platelets 150 - 400 K/uL 237 231 303    . CMP Latest Ref Rng & Units 11/05/2019 10/22/2019 10/08/2019  Glucose 70 - 99 mg/dL 98 106(H) 84  BUN 6 - 20 mg/dL 10 14 11   Creatinine 0.44 - 1.00 mg/dL 0.68 0.73 0.74  Sodium 135 - 145 mmol/L 143 142 144  Potassium 3.5 - 5.1 mmol/L 3.9 3.7 4.0  Chloride 98 - 111 mmol/L 108 110 107  CO2 22 - 32 mmol/L 26 24 26   Calcium 8.9 - 10.3 mg/dL 8.9 8.5(L) 9.4  Total Protein 6.5 - 8.1 g/dL 6.5 6.3(L) 6.9  Total Bilirubin 0.3 - 1.2 mg/dL 0.5 0.5 0.9  Alkaline Phos 38 - 126 U/L 55 53 53  AST 15 - 41 U/L 13(L) 12(L) 13(L)  ALT 0 - 44 U/L 21 19 18    . Lab Results  Component Value Date   IRON 79 07/09/2019   TIBC 221 (L) 07/09/2019   IRONPCTSAT 36 07/09/2019   (Iron and TIBC)  Lab Results  Component Value Date   FERRITIN 590 (H) 07/09/2019  08/13/2019 NM PET Image Restag (PS) Skull Base To Thigh (Accession [id number])   05/21/2019 [id number]) Lymph Node Biopsy    RADIOGRAPHIC STUDIES: I have personally reviewed the radiological images as listed and agreed with the findings in the report. No results found.  ASSESSMENT & PLAN:   1) Recently diagnosed Classical Hodgkins lymphoma Likely stage IIIB based on  imaging thus far. 2) Microcytic Anemia due to anemia of chronic disease and Iron deficiency- hg improved to 13 3) Jehovah's Witness   PLAN: -Discussed pt labwork today, 11/19/19; CBC w/diff and CMP is as follows: all values are WNL except for RBC at 3.77, AST at 11. -Advised on eye pain and cluster headaches -Advised on COVID19 vaccine and possibility of lymph nodes swelling  -Recommends not using blades for shaving under armpit -Recommends walking 20-30 minutes daily  -No prohibitive tox from chemotherapy at this time -Will proceed with Cycle 6 day one of treatment today. -Will see back in 2 months with pet scan  FOLLOW UP: May discontinue MD visit with [id number] of treatment in 2 weeks. Keep other appointments. PET/CT in 7 weeks RTC with Dr Irene Limbo with labs in 8 weeks   The total time spent in the appt was 30 minutes and more than 50% was on counseling and direct patient cares.  All of the patient's questions were answered with apparent satisfaction. The patient knows to call the clinic with any problems, questions or concerns.     Sullivan Lone MD MS AAHIVMS [facility] Hematology/Oncology Physician [facility]  (Office):       [phone] (Work cell):  [phone] (Fax):           [phone]  11/18/2019 4:21 PM I, Dawayne Cirri am acting as a Education administrator for Dr. Sullivan Lone.   .I have reviewed the above documentation for accuracy and completeness, and I agree with the  above. .Brunetta Genera MD

## 2019-11-19 ENCOUNTER — Inpatient Hospital Stay: Payer: No Typology Code available for payment source

## 2019-11-19 ENCOUNTER — Inpatient Hospital Stay (HOSPITAL_BASED_OUTPATIENT_CLINIC_OR_DEPARTMENT_OTHER): Payer: No Typology Code available for payment source | Admitting: Hematology

## 2019-11-19 ENCOUNTER — Other Ambulatory Visit: Payer: Self-pay

## 2019-11-19 ENCOUNTER — Inpatient Hospital Stay: Payer: No Typology Code available for payment source | Attending: Hematology

## 2019-11-19 VITALS — BP 118/76 | HR 82 | Temp 98.7°F | Resp 18 | Ht 64.0 in | Wt 211.1 lb

## 2019-11-19 DIAGNOSIS — R519 Headache, unspecified: Secondary | ICD-10-CM | POA: Diagnosis not present

## 2019-11-19 DIAGNOSIS — Z7689 Persons encountering health services in other specified circumstances: Secondary | ICD-10-CM | POA: Insufficient documentation

## 2019-11-19 DIAGNOSIS — R202 Paresthesia of skin: Secondary | ICD-10-CM | POA: Diagnosis not present

## 2019-11-19 DIAGNOSIS — Z79899 Other long term (current) drug therapy: Secondary | ICD-10-CM | POA: Diagnosis not present

## 2019-11-19 DIAGNOSIS — Z5111 Encounter for antineoplastic chemotherapy: Secondary | ICD-10-CM

## 2019-11-19 DIAGNOSIS — R5383 Other fatigue: Secondary | ICD-10-CM | POA: Insufficient documentation

## 2019-11-19 DIAGNOSIS — H5710 Ocular pain, unspecified eye: Secondary | ICD-10-CM | POA: Diagnosis not present

## 2019-11-19 DIAGNOSIS — Z7952 Long term (current) use of systemic steroids: Secondary | ICD-10-CM | POA: Insufficient documentation

## 2019-11-19 DIAGNOSIS — C8111 Nodular sclerosis classical Hodgkin lymphoma, lymph nodes of head, face, and neck: Secondary | ICD-10-CM | POA: Insufficient documentation

## 2019-11-19 DIAGNOSIS — C8110 Nodular sclerosis classical Hodgkin lymphoma, unspecified site: Secondary | ICD-10-CM

## 2019-11-19 DIAGNOSIS — R200 Anesthesia of skin: Secondary | ICD-10-CM | POA: Insufficient documentation

## 2019-11-19 DIAGNOSIS — D509 Iron deficiency anemia, unspecified: Secondary | ICD-10-CM | POA: Diagnosis not present

## 2019-11-19 DIAGNOSIS — Z7189 Other specified counseling: Secondary | ICD-10-CM

## 2019-11-19 LAB — CBC WITH DIFFERENTIAL/PLATELET
Abs Immature Granulocytes: 0.06 K/uL (ref 0.00–0.07)
Basophils Absolute: 0 K/uL (ref 0.0–0.1)
Basophils Relative: 1 %
Eosinophils Absolute: 0.1 K/uL (ref 0.0–0.5)
Eosinophils Relative: 1 %
HCT: 37.6 % (ref 36.0–46.0)
Hemoglobin: 12.4 g/dL (ref 12.0–15.0)
Immature Granulocytes: 1 %
Lymphocytes Relative: 26 %
Lymphs Abs: 2 K/uL (ref 0.7–4.0)
MCH: 32.9 pg (ref 26.0–34.0)
MCHC: 33 g/dL (ref 30.0–36.0)
MCV: 99.7 fL (ref 80.0–100.0)
Monocytes Absolute: 0.6 K/uL (ref 0.1–1.0)
Monocytes Relative: 8 %
Neutro Abs: 5 K/uL (ref 1.7–7.7)
Neutrophils Relative %: 63 %
Platelets: 226 K/uL (ref 150–400)
RBC: 3.77 MIL/uL — ABNORMAL LOW (ref 3.87–5.11)
RDW: 14.4 % (ref 11.5–15.5)
WBC: 7.9 K/uL (ref 4.0–10.5)
nRBC: 0 % (ref 0.0–0.2)

## 2019-11-19 LAB — CMP (CANCER CENTER ONLY)
ALT: 15 U/L (ref 0–44)
AST: 11 U/L — ABNORMAL LOW (ref 15–41)
Albumin: 3.8 g/dL (ref 3.5–5.0)
Alkaline Phosphatase: 55 U/L (ref 38–126)
Anion gap: 7 (ref 5–15)
BUN: 16 mg/dL (ref 6–20)
CO2: 26 mmol/L (ref 22–32)
Calcium: 9.1 mg/dL (ref 8.9–10.3)
Chloride: 109 mmol/L (ref 98–111)
Creatinine: 0.72 mg/dL (ref 0.44–1.00)
GFR, Est AFR Am: >60 mL/min (ref >60)
GFR, Estimated: >60 mL/min (ref >60)
Glucose, Bld: 85 mg/dL (ref 70–99)
Potassium: 3.9 mmol/L (ref 3.5–5.1)
Sodium: 142 mmol/L (ref 135–145)
Total Bilirubin: 0.4 mg/dL (ref 0.3–1.2)
Total Protein: 6.5 g/dL (ref 6.5–8.1)

## 2019-11-19 MED ORDER — PALONOSETRON HCL INJECTION 0.25 MG/5ML
0.2500 mg | Freq: Once | INTRAVENOUS | Status: AC
  Administered 2019-11-19: 0.25 mg via INTRAVENOUS

## 2019-11-19 MED ORDER — LORAZEPAM 2 MG/ML IJ SOLN
INTRAMUSCULAR | Status: AC
  Filled 2019-11-19: qty 1

## 2019-11-19 MED ORDER — LORAZEPAM 2 MG/ML IJ SOLN
0.5000 mg | Freq: Once | INTRAMUSCULAR | Status: AC
  Administered 2019-11-19: 0.5 mg via INTRAVENOUS

## 2019-11-19 MED ORDER — DOXORUBICIN HCL CHEMO IV INJECTION 2 MG/ML
25.0000 mg/m2 | Freq: Once | INTRAVENOUS | Status: AC
  Administered 2019-11-19: 50 mg via INTRAVENOUS
  Filled 2019-11-19: qty 25

## 2019-11-19 MED ORDER — SODIUM CHLORIDE 0.9% FLUSH
10.0000 mL | INTRAVENOUS | Status: DC | PRN
  Administered 2019-11-19: 10 mL
  Filled 2019-11-19: qty 10

## 2019-11-19 MED ORDER — HEPARIN SOD (PORK) LOCK FLUSH 100 UNIT/ML IV SOLN
500.0000 [IU] | Freq: Once | INTRAVENOUS | Status: AC | PRN
  Administered 2019-11-19: 500 [IU]
  Filled 2019-11-19: qty 5

## 2019-11-19 MED ORDER — SODIUM CHLORIDE 0.9 % IV SOLN
Freq: Once | INTRAVENOUS | Status: AC
  Filled 2019-11-19: qty 5

## 2019-11-19 MED ORDER — SODIUM CHLORIDE 0.9 % IV SOLN
Freq: Once | INTRAVENOUS | Status: AC
  Filled 2019-11-19: qty 250

## 2019-11-19 MED ORDER — PALONOSETRON HCL INJECTION 0.25 MG/5ML
INTRAVENOUS | Status: AC
  Filled 2019-11-19: qty 5

## 2019-11-19 MED ORDER — VINBLASTINE SULFATE CHEMO INJECTION 1 MG/ML
6.0000 mg/m2 | Freq: Once | INTRAVENOUS | Status: AC
  Administered 2019-11-19: 12 mg via INTRAVENOUS
  Filled 2019-11-19: qty 12

## 2019-11-19 MED ORDER — SODIUM CHLORIDE 0.9 % IV SOLN
375.0000 mg/m2 | Freq: Once | INTRAVENOUS | Status: AC
  Administered 2019-11-19: 750 mg via INTRAVENOUS
  Filled 2019-11-19: qty 75

## 2019-11-19 NOTE — Patient Instructions (Signed)
[facility] Discharge Instructions for Patients Receiving Chemotherapy  Today you received the following chemotherapy agents:  Doxorubicin, Vinblastine, Dacarbazine  To help prevent nausea and vomiting after your treatment, we encourage you to take your nausea medication as prescribed.   If you develop nausea and vomiting that is not controlled by your nausea medication, call the clinic.   BELOW ARE SYMPTOMS THAT SHOULD BE REPORTED IMMEDIATELY:  *FEVER GREATER THAN 100.5 F  *CHILLS WITH OR WITHOUT FEVER  NAUSEA AND VOMITING THAT IS NOT CONTROLLED WITH YOUR NAUSEA MEDICATION  *UNUSUAL SHORTNESS OF BREATH  *UNUSUAL BRUISING OR BLEEDING  TENDERNESS IN MOUTH AND THROAT WITH OR WITHOUT PRESENCE OF ULCERS  *URINARY PROBLEMS  *BOWEL PROBLEMS  UNUSUAL RASH Items with * indicate a potential emergency and should be followed up as soon as possible.  Feel free to call the clinic should you have any questions or concerns. The clinic phone number is [phone].  Please show the [place] [address] at check-in to the Emergency Department and triage nurse.

## 2019-11-20 ENCOUNTER — Inpatient Hospital Stay: Payer: No Typology Code available for payment source

## 2019-11-20 ENCOUNTER — Encounter: Payer: Self-pay | Admitting: Hematology

## 2019-11-20 ENCOUNTER — Other Ambulatory Visit: Payer: Self-pay

## 2019-11-20 VITALS — BP 136/68 | HR 102 | Temp 97.8°F | Resp 17

## 2019-11-20 DIAGNOSIS — Z5111 Encounter for antineoplastic chemotherapy: Secondary | ICD-10-CM | POA: Diagnosis not present

## 2019-11-20 MED ORDER — FILGRASTIM-SNDZ 480 MCG/0.8ML IJ SOSY
480.0000 ug | PREFILLED_SYRINGE | Freq: Once | INTRAMUSCULAR | Status: AC
  Administered 2019-11-20: 480 ug via SUBCUTANEOUS

## 2019-11-20 NOTE — Patient Instructions (Signed)

## 2019-11-22 ENCOUNTER — Inpatient Hospital Stay: Payer: No Typology Code available for payment source

## 2019-11-22 ENCOUNTER — Telehealth: Payer: Self-pay | Admitting: Hematology

## 2019-11-22 ENCOUNTER — Other Ambulatory Visit: Payer: Self-pay

## 2019-11-22 VITALS — BP 121/69 | HR 94 | Temp 98.0°F | Resp 18

## 2019-11-22 DIAGNOSIS — Z5111 Encounter for antineoplastic chemotherapy: Secondary | ICD-10-CM | POA: Diagnosis not present

## 2019-11-22 DIAGNOSIS — C8111 Nodular sclerosis classical Hodgkin lymphoma, lymph nodes of head, face, and neck: Secondary | ICD-10-CM

## 2019-11-22 DIAGNOSIS — Z7189 Other specified counseling: Secondary | ICD-10-CM

## 2019-11-22 MED ORDER — FILGRASTIM-SNDZ 480 MCG/0.8ML IJ SOSY
480.0000 ug | PREFILLED_SYRINGE | Freq: Once | INTRAMUSCULAR | Status: AC
  Administered 2019-11-22: 480 ug via SUBCUTANEOUS

## 2019-11-22 MED ORDER — FILGRASTIM-SNDZ 480 MCG/0.8ML IJ SOSY
PREFILLED_SYRINGE | INTRAMUSCULAR | Status: AC
  Filled 2019-11-22: qty 0.8

## 2019-11-22 NOTE — Patient Instructions (Signed)

## 2019-11-22 NOTE — Telephone Encounter (Signed)
Rescheduled March appointments, could not leave a voicemail due to it being full. Calender will be mailed.

## 2019-11-23 ENCOUNTER — Inpatient Hospital Stay: Payer: No Typology Code available for payment source

## 2019-11-23 ENCOUNTER — Other Ambulatory Visit: Payer: Self-pay

## 2019-11-23 VITALS — BP 123/72 | HR 79 | Temp 98.2°F | Resp 18

## 2019-11-23 DIAGNOSIS — Z7189 Other specified counseling: Secondary | ICD-10-CM

## 2019-11-23 DIAGNOSIS — C8111 Nodular sclerosis classical Hodgkin lymphoma, lymph nodes of head, face, and neck: Secondary | ICD-10-CM

## 2019-11-23 DIAGNOSIS — Z5111 Encounter for antineoplastic chemotherapy: Secondary | ICD-10-CM | POA: Diagnosis not present

## 2019-11-23 MED ORDER — FILGRASTIM-SNDZ 480 MCG/0.8ML IJ SOSY
480.0000 ug | PREFILLED_SYRINGE | Freq: Once | INTRAMUSCULAR | Status: AC
  Administered 2019-11-23: 480 ug via SUBCUTANEOUS

## 2019-11-23 MED ORDER — FILGRASTIM-SNDZ 480 MCG/0.8ML IJ SOSY
PREFILLED_SYRINGE | INTRAMUSCULAR | Status: AC
  Filled 2019-11-23: qty 0.8

## 2019-11-23 NOTE — Patient Instructions (Signed)

## 2019-12-03 ENCOUNTER — Other Ambulatory Visit: Payer: Self-pay

## 2019-12-03 ENCOUNTER — Inpatient Hospital Stay: Payer: No Typology Code available for payment source

## 2019-12-03 ENCOUNTER — Other Ambulatory Visit: Payer: No Typology Code available for payment source

## 2019-12-03 ENCOUNTER — Ambulatory Visit: Payer: No Typology Code available for payment source | Admitting: Hematology

## 2019-12-03 ENCOUNTER — Ambulatory Visit: Payer: No Typology Code available for payment source

## 2019-12-03 VITALS — BP 119/68 | HR 81 | Temp 98.0°F | Resp 17 | Wt 214.2 lb

## 2019-12-03 DIAGNOSIS — Z7189 Other specified counseling: Secondary | ICD-10-CM

## 2019-12-03 DIAGNOSIS — C8111 Nodular sclerosis classical Hodgkin lymphoma, lymph nodes of head, face, and neck: Secondary | ICD-10-CM

## 2019-12-03 DIAGNOSIS — Z5111 Encounter for antineoplastic chemotherapy: Secondary | ICD-10-CM

## 2019-12-03 LAB — CBC WITH DIFFERENTIAL/PLATELET
Abs Immature Granulocytes: 0.06 10*3/uL (ref 0.00–0.07)
Basophils Absolute: 0 10*3/uL (ref 0.0–0.1)
Basophils Relative: 1 %
Eosinophils Absolute: 0.1 10*3/uL (ref 0.0–0.5)
Eosinophils Relative: 2 %
HCT: 38.2 % (ref 36.0–46.0)
Hemoglobin: 12.8 g/dL (ref 12.0–15.0)
Immature Granulocytes: 1 %
Lymphocytes Relative: 29 %
Lymphs Abs: 1.8 10*3/uL (ref 0.7–4.0)
MCH: 33.3 pg (ref 26.0–34.0)
MCHC: 33.5 g/dL (ref 30.0–36.0)
MCV: 99.5 fL (ref 80.0–100.0)
Monocytes Absolute: 0.6 10*3/uL (ref 0.1–1.0)
Monocytes Relative: 9 %
Neutro Abs: 3.7 10*3/uL (ref 1.7–7.7)
Neutrophils Relative %: 58 %
Platelets: 240 10*3/uL (ref 150–400)
RBC: 3.84 MIL/uL — ABNORMAL LOW (ref 3.87–5.11)
RDW: 14 % (ref 11.5–15.5)
WBC: 6.2 10*3/uL (ref 4.0–10.5)
nRBC: 0 % (ref 0.0–0.2)

## 2019-12-03 LAB — CMP (CANCER CENTER ONLY)
ALT: 18 U/L (ref 0–44)
AST: 14 U/L — ABNORMAL LOW (ref 15–41)
Albumin: 3.5 g/dL (ref 3.5–5.0)
Alkaline Phosphatase: 59 U/L (ref 38–126)
Anion gap: 8 (ref 5–15)
BUN: 10 mg/dL (ref 6–20)
CO2: 25 mmol/L (ref 22–32)
Calcium: 8.9 mg/dL (ref 8.9–10.3)
Chloride: 107 mmol/L (ref 98–111)
Creatinine: 0.66 mg/dL (ref 0.44–1.00)
GFR, Est AFR Am: >60 mL/min (ref >60)
GFR, Estimated: >60 mL/min (ref >60)
Glucose, Bld: 99 mg/dL (ref 70–99)
Potassium: 3.6 mmol/L (ref 3.5–5.1)
Sodium: 140 mmol/L (ref 135–145)
Total Bilirubin: 0.4 mg/dL (ref 0.3–1.2)
Total Protein: 6.4 g/dL — ABNORMAL LOW (ref 6.5–8.1)

## 2019-12-03 MED ORDER — HEPARIN SOD (PORK) LOCK FLUSH 100 UNIT/ML IV SOLN
500.0000 [IU] | Freq: Once | INTRAVENOUS | Status: AC | PRN
  Administered 2019-12-03: 500 [IU]
  Filled 2019-12-03: qty 5

## 2019-12-03 MED ORDER — SODIUM CHLORIDE 0.9 % IV SOLN
Freq: Once | INTRAVENOUS | Status: AC
  Filled 2019-12-03: qty 5

## 2019-12-03 MED ORDER — DOXORUBICIN HCL CHEMO IV INJECTION 2 MG/ML
25.0000 mg/m2 | Freq: Once | INTRAVENOUS | Status: AC
  Administered 2019-12-03: 50 mg via INTRAVENOUS
  Filled 2019-12-03: qty 25

## 2019-12-03 MED ORDER — PALONOSETRON HCL INJECTION 0.25 MG/5ML
0.2500 mg | Freq: Once | INTRAVENOUS | Status: AC
  Administered 2019-12-03: 0.25 mg via INTRAVENOUS

## 2019-12-03 MED ORDER — SODIUM CHLORIDE 0.9 % IV SOLN
Freq: Once | INTRAVENOUS | Status: AC
  Filled 2019-12-03: qty 250

## 2019-12-03 MED ORDER — VINBLASTINE SULFATE CHEMO INJECTION 1 MG/ML
6.0000 mg/m2 | Freq: Once | INTRAVENOUS | Status: AC
  Administered 2019-12-03: 12 mg via INTRAVENOUS
  Filled 2019-12-03: qty 12

## 2019-12-03 MED ORDER — LORAZEPAM 2 MG/ML IJ SOLN
INTRAMUSCULAR | Status: AC
  Filled 2019-12-03: qty 1

## 2019-12-03 MED ORDER — LORAZEPAM 2 MG/ML IJ SOLN
0.5000 mg | Freq: Once | INTRAMUSCULAR | Status: AC
  Administered 2019-12-03: 0.5 mg via INTRAVENOUS

## 2019-12-03 MED ORDER — PALONOSETRON HCL INJECTION 0.25 MG/5ML
INTRAVENOUS | Status: AC
  Filled 2019-12-03: qty 5

## 2019-12-03 MED ORDER — SODIUM CHLORIDE 0.9 % IV SOLN
375.0000 mg/m2 | Freq: Once | INTRAVENOUS | Status: AC
  Administered 2019-12-03: 750 mg via INTRAVENOUS
  Filled 2019-12-03: qty 75

## 2019-12-03 MED ORDER — SODIUM CHLORIDE 0.9% FLUSH
10.0000 mL | INTRAVENOUS | Status: DC | PRN
  Administered 2019-12-03: 10 mL
  Filled 2019-12-03: qty 10

## 2019-12-03 NOTE — Patient Instructions (Signed)
COVID-19 Vaccine Information can be found at: [email or web address] For questions related to vaccine distribution or appointments, please email [email or web address] or call [phone].   [place] Discharge Instructions for Patients Receiving Chemotherapy  Today you received the following chemotherapy agents: Adriamycin (Doxorubicin), Vinblastine (Velban), and Dacarbazine (DTIC)  To help prevent nausea and vomiting after your treatment, we encourage you to take your nausea medication as directed by your provider.   If you develop nausea and vomiting that is not controlled by your nausea medication, call the clinic.   BELOW ARE SYMPTOMS THAT SHOULD BE REPORTED IMMEDIATELY:  *FEVER GREATER THAN 100.5 F  *CHILLS WITH OR WITHOUT FEVER  NAUSEA AND VOMITING THAT IS NOT CONTROLLED WITH YOUR NAUSEA MEDICATION  *UNUSUAL SHORTNESS OF BREATH  *UNUSUAL BRUISING OR BLEEDING  TENDERNESS IN MOUTH AND THROAT WITH OR WITHOUT PRESENCE OF ULCERS  *URINARY PROBLEMS  *BOWEL PROBLEMS  UNUSUAL RASH Items with * indicate a potential emergency and should be followed up as soon as possible.  Feel free to call the clinic should you have any questions or concerns. The clinic phone number is [phone].  Please show the [place] at check-in to the Emergency Department and triage nurse.  Coronavirus (COVID-19) Are you at risk?  Are you at risk for the Coronavirus (COVID-19)?  To be considered HIGH RISK for Coronavirus (COVID-19), you have to meet the following criteria:  . Traveled to [place], [place], [place], [place] or [place]; or in the [place] to [place], [place], [place], or [place]; and have fever, cough, and shortness of breath within the last 2 weeks of travel OR . Been in close contact with a person diagnosed with COVID-19 within the last 2 weeks and have fever, cough, and shortness of  breath . IF YOU DO NOT MEET THESE CRITERIA, YOU ARE CONSIDERED LOW RISK FOR COVID-19.  What to do if you are HIGH RISK for COVID-19?  Marland Kitchen If you are having a medical emergency, call 911. . Seek medical care right away. Before you go to a doctor's office, urgent care or emergency department, call ahead and tell them about your recent travel, contact with someone diagnosed with COVID-19, and your symptoms. You should receive instructions from your physician's office regarding next steps of care.  . When you arrive at healthcare provider, tell the healthcare staff immediately you have returned from visiting [place], [place], [place], [place] or [place]; or traveled in the [place] to [place], [place], [place], or [place]; in the last two weeks or you have been in close contact with a person diagnosed with COVID-19 in the last 2 weeks.   . Tell the health care staff about your symptoms: fever, cough and shortness of breath. . After you have been seen by a medical provider, you will be either: o Tested for (COVID-19) and discharged home on quarantine except to seek medical care if symptoms worsen, and asked to  - Stay home and avoid contact with others until you get your results (4-5 days)  - Avoid travel on public transportation if possible (such as bus, train, or airplane) or o Sent to the Emergency Department by EMS for evaluation, COVID-19 testing, and possible admission depending on your condition and test results.  What to do if you are LOW RISK for COVID-19?  Reduce your risk of any infection by using the same precautions used for avoiding the common cold or flu:  Marland Kitchen Wash your hands often with soap  and warm water for at least 20 seconds.  If soap and water are not readily available, use an alcohol-based hand sanitizer with at least 60% alcohol.  . If coughing or sneezing, cover your mouth and nose by coughing or sneezing into the elbow areas of your shirt or coat, into a tissue or into  your sleeve (not your hands). . Avoid shaking hands with others and consider head nods or verbal greetings only. . Avoid touching your eyes, nose, or mouth with unwashed hands.  . Avoid close contact with people who are sick. . Avoid places or events with large numbers of people in one location, like concerts or sporting events. . Carefully consider travel plans you have or are making. . If you are planning any travel outside or inside the [place], visit the CDC's Travelers' Health webpage for the latest health notices. . If you have some symptoms but not all symptoms, continue to monitor at home and seek medical attention if your symptoms worsen. . If you are having a medical emergency, call 911.   [place] / e-Visit: [email or web address]         [facility]: [facility]: [id number]                   [facility]: [phone]

## 2019-12-04 ENCOUNTER — Inpatient Hospital Stay: Payer: No Typology Code available for payment source

## 2019-12-04 VITALS — BP 119/69 | HR 87 | Temp 97.6°F | Resp 16

## 2019-12-04 DIAGNOSIS — Z5111 Encounter for antineoplastic chemotherapy: Secondary | ICD-10-CM | POA: Diagnosis not present

## 2019-12-04 DIAGNOSIS — C8111 Nodular sclerosis classical Hodgkin lymphoma, lymph nodes of head, face, and neck: Secondary | ICD-10-CM

## 2019-12-04 DIAGNOSIS — Z7189 Other specified counseling: Secondary | ICD-10-CM

## 2019-12-04 MED ORDER — FILGRASTIM-SNDZ 480 MCG/0.8ML IJ SOSY
PREFILLED_SYRINGE | INTRAMUSCULAR | Status: AC
  Filled 2019-12-04: qty 0.8

## 2019-12-04 MED ORDER — FILGRASTIM-SNDZ 480 MCG/0.8ML IJ SOSY
480.0000 ug | PREFILLED_SYRINGE | Freq: Once | INTRAMUSCULAR | Status: AC
  Administered 2019-12-04: 480 ug via SUBCUTANEOUS

## 2019-12-04 NOTE — Patient Instructions (Signed)

## 2019-12-06 ENCOUNTER — Inpatient Hospital Stay: Payer: No Typology Code available for payment source

## 2019-12-06 ENCOUNTER — Other Ambulatory Visit: Payer: Self-pay

## 2019-12-06 VITALS — BP 116/73 | HR 77 | Temp 97.8°F | Resp 16

## 2019-12-06 DIAGNOSIS — Z7189 Other specified counseling: Secondary | ICD-10-CM

## 2019-12-06 DIAGNOSIS — Z5111 Encounter for antineoplastic chemotherapy: Secondary | ICD-10-CM | POA: Diagnosis not present

## 2019-12-06 DIAGNOSIS — C8111 Nodular sclerosis classical Hodgkin lymphoma, lymph nodes of head, face, and neck: Secondary | ICD-10-CM

## 2019-12-06 MED ORDER — FILGRASTIM-SNDZ 480 MCG/0.8ML IJ SOSY
480.0000 ug | PREFILLED_SYRINGE | Freq: Once | INTRAMUSCULAR | Status: AC
  Administered 2019-12-06: 480 ug via SUBCUTANEOUS

## 2019-12-06 MED ORDER — FILGRASTIM-SNDZ 480 MCG/0.8ML IJ SOSY
PREFILLED_SYRINGE | INTRAMUSCULAR | Status: AC
  Filled 2019-12-06: qty 0.8

## 2019-12-06 NOTE — Patient Instructions (Signed)

## 2019-12-07 ENCOUNTER — Other Ambulatory Visit: Payer: Self-pay

## 2019-12-07 ENCOUNTER — Inpatient Hospital Stay: Payer: No Typology Code available for payment source

## 2019-12-07 VITALS — BP 128/72 | HR 72 | Temp 98.2°F | Resp 18

## 2019-12-07 DIAGNOSIS — Z7189 Other specified counseling: Secondary | ICD-10-CM

## 2019-12-07 DIAGNOSIS — C8111 Nodular sclerosis classical Hodgkin lymphoma, lymph nodes of head, face, and neck: Secondary | ICD-10-CM

## 2019-12-07 DIAGNOSIS — Z5111 Encounter for antineoplastic chemotherapy: Secondary | ICD-10-CM | POA: Diagnosis not present

## 2019-12-07 MED ORDER — FILGRASTIM-SNDZ 480 MCG/0.8ML IJ SOSY
PREFILLED_SYRINGE | INTRAMUSCULAR | Status: AC
  Filled 2019-12-07: qty 0.8

## 2019-12-07 MED ORDER — FILGRASTIM-SNDZ 480 MCG/0.8ML IJ SOSY
480.0000 ug | PREFILLED_SYRINGE | Freq: Once | INTRAMUSCULAR | Status: AC
  Administered 2019-12-07: 480 ug via SUBCUTANEOUS

## 2019-12-17 ENCOUNTER — Telehealth: Payer: Self-pay | Admitting: *Deleted

## 2019-12-17 NOTE — Telephone Encounter (Signed)
Patient called. Has some pain/discomfort in left forearm. Area is not red or swollen. She still has full range of motion. She thinks it might be from lifting groceries/water bottles. She would like Dr.Kale informed, but is not seeking treatment at this time. She will try warm compresses, but if it should become more painful/red/swollen over weekend, will seek emergent care.   Advised her that MD out of office today, but will send him message with info Patient has decided to wait to schedule her covid vaccination since she is to have a PET scan scheduled around 4/23, and she does not want to affect her lymph nodes. She will schedule vaccine after PET - once it is scheduled.

## 2019-12-27 ENCOUNTER — Telehealth: Payer: Self-pay | Admitting: *Deleted

## 2019-12-27 ENCOUNTER — Other Ambulatory Visit: Payer: Self-pay | Admitting: Hematology

## 2019-12-27 NOTE — Telephone Encounter (Signed)
Pt called - bump in right axilla never went away, but had flattened. Now increased in size, red, and painful. Using warm compresses and antibiotic ointment, but still increasing in size. She wants to know if Dr. Irene Limbo thinks she should be doing anything else? Dr. Irene Limbo informed.  Dr. Irene Limbo called and spoke with Ms. Ey. He recommended she use mupirocin and triple antibiotic alternately, and warm compressions 4X daily. He reported that patient stated to him that it appears to be coming to a head and will likely drain. Patient to call office if worsening -- to consider if appointment needed with MD.

## 2019-12-31 MED FILL — MUPIROCIN 2% OINTMENT: 2 | 14 days supply | Qty: 22 | Fill #0

## 2020-01-04 ENCOUNTER — Other Ambulatory Visit: Payer: Self-pay

## 2020-01-04 ENCOUNTER — Ambulatory Visit (HOSPITAL_COMMUNITY)
Admission: RE | Admit: 2020-01-04 | Discharge: 2020-01-04 | Disposition: A | Discharge status: Home / Self Care | Payer: No Typology Code available for payment source | Source: Ambulatory Visit | Attending: Hematology | Admitting: Hematology

## 2020-01-04 DIAGNOSIS — Z5111 Encounter for antineoplastic chemotherapy: Secondary | ICD-10-CM | POA: Diagnosis present

## 2020-01-04 DIAGNOSIS — C8111 Nodular sclerosis classical Hodgkin lymphoma, lymph nodes of head, face, and neck: Secondary | ICD-10-CM | POA: Diagnosis present

## 2020-01-04 LAB — GLUCOSE, CAPILLARY: Glucose-Capillary: 79 mg/dL (ref 70–99)

## 2020-01-04 MED ORDER — FLUDEOXYGLUCOSE F - 18 (FDG) INJECTION
10.6000 | Freq: Once | INTRAVENOUS | Status: AC | PRN
  Administered 2020-01-04: 10.6 via INTRAVENOUS

## 2020-01-13 NOTE — Progress Notes (Signed)
HEMATOLOGY/ONCOLOGY CLINIC NOTE  Date of Service: 01/14/20   Patient Care Team: [name], MD as PCP - General (Family Medicine)  CHIEF COMPLAINTS/PURPOSE OF CONSULTATION:   Continue management of classical hodgkins lymphoma   CURRENT TREATMENT: ABVD   HISTORY OF PRESENTING ILLNESS:   Elizabeth Beasley is a wonderful 39 y.o. female who has been referred to [place] by Dr Madilyn Fireman for evaluation and management of microcytic anemia/cervical lymphadenopathy.The pt reports that she has a history of iron deficiency but has never had a transfusion. Pt has had two pregnancies and has two children, ages 55 and 54. There was a concern for anemia with her second pregnancy so she was given an erythropoietin shot. She has had a copper IUD for about 2 years and her menstrual cycle has been slightly longer since getting the IUD. She has been taking 28 mg TID PO iron since June. Pt has no known medication allergies and has had a previous removal of Bartholin's gland cyst. Pt has had no known contact with chemicals either through work or hobbies. She has no history of asthma or heavy smoke/aerosol exposure.   Pt did not notice any cervical lymphadenopathy in June. In late July, early August she began to experience extreme fatigue and could begin to feel small lumps in her neck. Her fatigue is improved with taking her PO Iron TID. She has experienced a couple of episodes of night sweats, with the latest occurring in late August. Pt has also had <10 lbs loss in 2 months. She has not had any rashes but is experiencing itching in her palms, broader itching after hot showers.   Pt is currently working from home. She has had her seasonal flu shot. Pt is not planning on having more children and is not concerned about fertility. Pt's husband would like to take a drive to the [facility] next month. Pt would not want any blood product transfusions, as she is a Jehovah's Witness. She is comfortable  with IV iron infusions.   INTERVAL HISTORY:  Elizabeth Beasley is a 39 y.o. female here for evaluation and management of hodgkins lymphoma. The patient's last visit with [place] was on 11/19/19. The pt reports that she is doing well overall.  The pt reports she is good. She is still having joint pain but has been having it since treatment. She is also having rib pain on the left side. Pt has been exercising. Her ankles have been swelling. Pt is scheduled for her COVID19 vaccine today.   Of note since the patient's last visit, pt has had PET Skull Base to Thigh ([id number]) completed on 01/04/20 with results revealing "1. Persistent enlarged mediastinal lymph nodes with metabolic activity slightly less than than liver activity ( [place]auville 3) 2. No residual metabolic activity in cervical lymph nodes. (Deauville 1) 3. No evidence of new or progressive lymphadenopathy. 4. Normal spleen and bone marrow. 5. Intense symmetric metabolic activity in the lingual tonsils is favored physiologic. No change from prior. 6. Persistent but decreased cutaneous activity in the anterior RIGHT shoulder."  Lab results today (01/14/20) of CBC w/diff and CMP is as follows: all values are WNL 01/14/20 of Sedimentation Rate at 8: WNL  On review of systems, pt reports left rib tenderness and denies abdominal pain, irregular bowl movements and any other symptoms.    MEDICAL HISTORY:  Past Medical History:  Diagnosis Date  . Anemia   . Bartholin gland cyst   . Hodgkin's lymphoma, nodular  sclerosis ([place]) 06/08/2019  . Hx of seasonal allergies   . Lymphadenopathy, cervical 05/21/2019  . PONV (postoperative nausea and vomiting)   . 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 CESAREAN SECTION  2010  . MASS EXCISION Right 05/21/2019   Procedure: EXCISION OF RIGHT DEEP NECK LYMPH NODE;  Surgeon: Fanny Skates, MD;  Location: [place];  Service: General;  Laterality: Right;  . PORTACATH PLACEMENT Right 06/08/2019   Procedure: INSERTION PORT-A-CATH WITH ULTRASOUND GUIDANCE;  Surgeon: Fanny Skates, MD;  Location: [place];  Service: General;  Laterality: Right;  . URETHRAL CYST REMOVAL N/A 08/30/2016   Procedure: BARTHOLIN'S GLAND CYST EXCISION;  Surgeon: Ardis Hughs, MD;  Location: [facility];  Service: Urology;  Laterality: N/A;  . v back     2012    SOCIAL HISTORY: Social History   Socioeconomic History  . Marital status: Married    Spouse name: Montine Circle  . Number of children: 2  . Years of education: Assoc  . Highest education level: Not on file  Occupational History  . Occupation: CMA    Employer: [employer]  Tobacco Use  . Smoking status: Never Smoker  . Smokeless tobacco: Never Used  Substance and Sexual Activity  . Alcohol use: Not Currently    Comment: ocassional  . Drug use: No  . Sexual activity: Yes    Partners: Male    Birth control/protection: I.U.D.  Other Topics Concern  . Not on file  Social History Narrative   Regular exercise. No regular caffeine.   Social Determinants of Health   Financial Resource Strain:   . Difficulty of Paying Living Expenses:   Food Insecurity:   . Worried About Charity fundraiser in the Last Year:   . Arboriculturist in the Last Year:   Transportation Needs:   . Film/video editor (Medical):   Marland Kitchen Lack of Transportation (Non-Medical):   Physical Activity:   . Days of Exercise per Week:   . Minutes of Exercise per Session:   Stress:   . Feeling of Stress :   Social Connections:   . Frequency of Communication with Friends and Family:   . Frequency of Social Gatherings with Friends and Family:   . Attends Religious Services:   . Active Member of Clubs or Organizations:   . Attends Archivist Meetings:   Marland Kitchen Marital Status:   Intimate Partner Violence:   . Fear of Current or Ex-Partner:   .  Emotionally Abused:   Marland Kitchen Physically Abused:   . Sexually Abused:     FAMILY HISTORY: Family History  Problem Relation Age of Onset  . Colon cancer Father 69  . Colon polyps Mother   . Hypertension Mother   . Diabetes Paternal Grandmother   . Stroke Paternal Grandmother   . Alcohol abuse Paternal Uncle   . Colon cancer Paternal Aunt 31  . Esophageal cancer Neg Hx   . Rectal cancer Neg Hx   . Stomach cancer Neg Hx    On PMHx the pt reports C-section, Bartholin's Gland Cyst removal. On Social Hx the pt reports non smoker, no alcohol use. On Family Hx the pt reports father passed from colon cancer, mother has colon polyps.  ALLERGIES:  has No Known Allergies.  MEDICATIONS:  Current Outpatient Medications  Medication Sig Dispense Refill  . b  complex vitamins capsule Take 1 capsule by mouth daily.    . cholecalciferol (VITAMIN D3) 25 MCG (1000 UT) tablet Take 2,000 Units by mouth daily.    Marland Kitchen dexamethasone (DECADRON) 4 MG tablet Take 2 tablets by mouth once a day on the day after chemotherapy and then take 2 tablets two times a day for 2 days. Take with food. 30 tablet 1  . fexofenadine (ALLEGRA) 180 MG tablet Take 180 mg by mouth daily as needed for allergies or rhinitis.    . fluconazole (DIFLUCAN) 150 MG tablet Take 1 tablet for a yeast infection if needed, may repeat in 3 days if needed (Patient not taking: Reported on 11/19/2019) 2 tablet 0  . LORazepam (ATIVAN) 0.5 MG tablet Take 1 tablet (0.5 mg total) by mouth every 6 (six) hours as needed (Nausea or vomiting). 30 tablet 0  . LOTEMAX SM 0.38 % GEL     . mupirocin ointment (BACTROBAN) 2 % Place 1 application into the nose 2 (two) times daily. 22 g 1  . ondansetron (ZOFRAN) 8 MG tablet Take 1 tablet (8 mg total) by mouth 2 (two) times daily as needed. Start on the third day after chemotherapy. 30 tablet 1  . potassium chloride SA (KLOR-CON) 20 MEQ tablet Take 1 tablet (20 mEq total) by mouth 2 (two) times daily. 60 tablet 1  .  prochlorperazine (COMPAZINE) 10 MG tablet Take 1 tablet (10 mg total) by mouth every 6 (six) hours as needed (Nausea or vomiting). 30 tablet 1   No current facility-administered medications for this visit.   Facility-Administered Medications Ordered in Other Visits  Medication Dose Route Frequency Provider Last Rate Last Admin  . sodium chloride flush (NS) 0.9 % injection 10 mL  10 mL Intracatheter PRN Brunetta Genera, MD   10 mL at 08/05/19 1658    REVIEW OF SYSTEMS:   A 10+ POINT REVIEW OF SYSTEMS WAS OBTAINED including neurology, dermatology, psychiatry, cardiac, respiratory, lymph, extremities, GI, GU, Musculoskeletal, constitutional, breasts, reproductive, HEENT.  All pertinent positives are noted in the HPI.  All others are negative.   PHYSICAL EXAMINATION: ECOG FS:1 - Symptomatic but completely ambulatory  Vitals:   01/14/20 1131  BP: 115/70  Pulse: (!) 103  Resp: 18  Temp: 98 F (36.7 C)  SpO2: 100%   Wt Readings from Last 3 Encounters:  01/14/20 220 lb 6.4 oz (100 kg)  12/03/19 214 lb 4 oz (97.2 kg)  11/19/19 211 lb 1.6 oz (95.8 kg)   Body mass index is 37.83 kg/m.    GENERAL:alert, in no acute distress and comfortable SKIN: no acute rashes, no significant lesions EYES: conjunctiva are pink and non-injected, sclera anicteric OROPHARYNX: MMM, no exudates, no oropharyngeal erythema or ulceration NECK: supple, no JVD LYMPH:  no palpable lymphadenopathy in the cervical, axillary or inguinal regions LUNGS: clear to auscultation b/l with normal respiratory effort HEART: regular rate & rhythm ABDOMEN:  normoactive bowel sounds , non tender, not distended. Extremity: no pedal edema PSYCH: alert & oriented x 3 with fluent speech NEURO: no focal motor/sensory deficits  LABORATORY DATA:  I have reviewed the data as listed  - 05/14/2019 Fe+TIBC+Fer is as follows: Iron at 11, TIBC at 258, % Sat at 4, Ferritin at 50. -Discussed 05/21/2019 Lymph Node Bx [id number])  which revealed " CLASSIC HODGKIN LYMPHOMA."  - 05/19/2019 CT C/A/P ([id number]) which revealed  "1. Enlarged mediastinal, bilateral hilar and lower cervical lymph nodes. Primary considerations include lymphoma, sarcoid and less likely metastatic disease.  Normal spleen. No evidence of enlarged lymph nodes within the abdomen or pelvis. 2. Low lying IUD within the LOWER uterine segment and may extend to the cervix."  - 05/17/2019 CT Soft Tissue Neck w/contrast ([id number]) which revealed "Bulky right greater than left cervical and mediastinal lymphadenopathy concerning for lymphoma."  . CBC Latest Ref Rng & Units 01/14/2020 12/03/2019 11/19/2019  WBC 4.0 - 10.5 K/uL 8.5 6.2 7.9  Hemoglobin 12.0 - 15.0 g/dL 13.6 12.8 12.4  Hematocrit 36.0 - 46.0 % 39.8 38.2 37.6  Platelets 150 - 400 K/uL 227 240 226    . CMP Latest Ref Rng & Units 01/14/2020 12/03/2019 11/19/2019  Glucose 70 - 99 mg/dL 85 99 85  BUN 6 - 20 mg/dL 12 10 16   Creatinine 0.44 - 1.00 mg/dL 0.71 0.66 0.72  Sodium 135 - 145 mmol/L 142 140 142  Potassium 3.5 - 5.1 mmol/L 3.7 3.6 3.9  Chloride 98 - 111 mmol/L 107 107 109  CO2 22 - 32 mmol/L 25 25 26   Calcium 8.9 - 10.3 mg/dL 8.9 8.9 9.1  Total Protein 6.5 - 8.1 g/dL 6.7 6.4(L) 6.5  Total Bilirubin 0.3 - 1.2 mg/dL 1.2 0.4 0.4  Alkaline Phos 38 - 126 U/L 67 59 55  AST 15 - 41 U/L 18 14(L) 11(L)  ALT 0 - 44 U/L 16 18 15    . Lab Results  Component Value Date   IRON 79 07/09/2019   TIBC 221 (L) 07/09/2019   IRONPCTSAT 36 07/09/2019   (Iron and TIBC)  Lab Results  Component Value Date   FERRITIN 590 (H) 07/09/2019  08/13/2019 NM PET Image Restag (PS) Skull Base To Thigh (Accession [id number])   05/21/2019 [id number]) Lymph Node Biopsy    RADIOGRAPHIC STUDIES: I have personally reviewed the radiological images as listed and agreed with the findings in the report. NM PET Image Restag (PS) Skull Base To Thigh  Result Date: 01/04/2020 CLINICAL DATA:  Subsequent treatment strategy  for classic Hodgkin's lymphoma status post 6 cycles induction chemotherapy. EXAM: NUCLEAR MEDICINE PET SKULL BASE TO THIGH TECHNIQUE: 10.6 mCi F-18 FDG was injected intravenously. Full-ring PET imaging was performed from the skull base to thigh after the radiotracer. CT data was obtained and used for attenuation correction and anatomic localization. Fasting blood glucose: 79 mg/dl COMPARISON:  None. FINDINGS: Mediastinal blood pool activity: SUV max 2.1 Liver activity: SUV max 3.5 NECK: No hypermetabolic or enlarged lymph nodes in the neck. Example RIGHT supraclavicular node with SUV max equal 1.5). Symmetric mild activity associated with the parotid glands and submandibular glands. Intense uptake noted in the bilateral tonsils without mass lesion. This uptake is symmetric side-to-side and similar comparison exam. Incidental CT findings: none CHEST: Enlarged mediastinal lymph nodes are again noted. Example RIGHT lower paratracheal node measuring 2.3 cm with SUV max equal 3.3. This is slightly less than liver activity (SUV max equal 3.5). Findings are similar to comparison exam. No suspicious pulmonary nodules. Incidental CT findings: none ABDOMEN/PELVIS: Resolution of gastrohepatic adenopathy. No evidence or retroperitoneal adenopathy. No iliac adenopathy. Incidental CT findings: Spleen is normal volume with normal metabolic activity. No abnormal liver activity SKELETON: No focal hypermetabolic activity to suggest skeletal metastasis. Broad region of cutaneous activity in the anterior RIGHT shoulder with focal nodularity. The focal nodular activity is decreased SUV max equal 2.6 decreased from 7.9 (image 40 of the PET data set). Incidental CT findings: none IMPRESSION: 1. Persistent enlarged mediastinal lymph nodes with metabolic activity slightly less than than liver activity ( [place]  3) 2. No residual metabolic activity in cervical lymph nodes. ( [place] 1) 3. No evidence of new or progressive  lymphadenopathy. 4. Normal spleen and bone marrow. 5. Intense symmetric metabolic activity in the lingual tonsils is favored physiologic. No change from prior. 6. Persistent but decreased cutaneous activity in the anterior RIGHT shoulder. Electronically Signed   By: Suzy Bouchard M.D.   On: 01/04/2020 13:07    ASSESSMENT & PLAN:   1) Recently diagnosed Classical Hodgkins lymphoma Likely stage IIIB based on imaging thus far. 2) Microcytic Anemia due to anemia of chronic disease and Iron deficiency- hg improved to 13 3) Jehovah's Witness   PLAN: -Discussed pt labwork today, 01/14/20; of CBC w/diff and CMP is as follows: all values are WNL -Discussed 01/14/20 of Sedimentation Rate at 8: WNL -Discussed 01/04/20 of PET Skull Base to Thigh ([id number]) -No prohibitive tox from chemotherapy at this time -Advised response to COVID19 vaccine response -Recommends getting COVID19 vaccine  -Advised on abscess in left armpit  -Advised once menstrual cycle begins  -Recommended that the pt continue to eat well, sleep enough, drink at least 48-64 oz of water each day, and walk 20-30 minutes each day.  -Goal equivalent to 10,000 steps a day  -Recommends not using blades for shaving under armpit -Recommends taking multivitamin or Vitamin B complex and vitamin D supplement  -Advised on biopsy on borderline lymph node in chest  -Will continue to watch lymph node in chest -repeat PET scan in 3-4 months  -Pt is in remission  -Will see back in 11 weeks with PET Scan and Labs  -RTC in 12 weeks   FOLLOW UP: PET/CT and labs in 11 weeks RTC with Dr Irene Limbo in 12 weeks  The total time spent in the appt was 30 minutes and more than 50% was on counseling and direct patient cares.  All of the patient's questions were answered with apparent satisfaction. The patient knows to call the clinic with any problems, questions or concerns.  Sullivan Lone MD [place] AAHIVMS [facility] Hematology/Oncology Physician [facility]  (Office):       [phone] (Work cell):  [phone] (Fax):           [phone]  01/14/2020 1:02 PM  I, Dawayne Cirri am acting as a Education administrator for Dr. Sullivan Lone.   .I have reviewed the above documentation for accuracy and completeness, and I agree with the above. Brunetta Genera MD

## 2020-01-14 ENCOUNTER — Telehealth: Payer: Self-pay | Admitting: Hematology

## 2020-01-14 ENCOUNTER — Inpatient Hospital Stay (HOSPITAL_BASED_OUTPATIENT_CLINIC_OR_DEPARTMENT_OTHER): Payer: No Typology Code available for payment source | Admitting: Hematology

## 2020-01-14 ENCOUNTER — Other Ambulatory Visit: Payer: Self-pay

## 2020-01-14 ENCOUNTER — Inpatient Hospital Stay: Payer: No Typology Code available for payment source

## 2020-01-14 ENCOUNTER — Inpatient Hospital Stay: Payer: No Typology Code available for payment source | Attending: Hematology

## 2020-01-14 VITALS — BP 115/70 | HR 103 | Temp 98.0°F | Resp 18 | Ht 64.0 in | Wt 220.4 lb

## 2020-01-14 DIAGNOSIS — D509 Iron deficiency anemia, unspecified: Secondary | ICD-10-CM | POA: Insufficient documentation

## 2020-01-14 DIAGNOSIS — L02411 Cutaneous abscess of right axilla: Secondary | ICD-10-CM | POA: Diagnosis not present

## 2020-01-14 DIAGNOSIS — C8111 Nodular sclerosis classical Hodgkin lymphoma, lymph nodes of head, face, and neck: Secondary | ICD-10-CM

## 2020-01-14 DIAGNOSIS — C8118 Nodular sclerosis classical Hodgkin lymphoma, lymph nodes of multiple sites: Secondary | ICD-10-CM | POA: Diagnosis not present

## 2020-01-14 DIAGNOSIS — Z833 Family history of diabetes mellitus: Secondary | ICD-10-CM | POA: Diagnosis not present

## 2020-01-14 DIAGNOSIS — Z9221 Personal history of antineoplastic chemotherapy: Secondary | ICD-10-CM | POA: Diagnosis not present

## 2020-01-14 DIAGNOSIS — Z800 Family history of malignant neoplasm of digestive organs: Secondary | ICD-10-CM | POA: Insufficient documentation

## 2020-01-14 DIAGNOSIS — Z79899 Other long term (current) drug therapy: Secondary | ICD-10-CM | POA: Insufficient documentation

## 2020-01-14 DIAGNOSIS — Z8249 Family history of ischemic heart disease and other diseases of the circulatory system: Secondary | ICD-10-CM | POA: Insufficient documentation

## 2020-01-14 DIAGNOSIS — Z823 Family history of stroke: Secondary | ICD-10-CM | POA: Diagnosis not present

## 2020-01-14 DIAGNOSIS — Z5111 Encounter for antineoplastic chemotherapy: Secondary | ICD-10-CM

## 2020-01-14 LAB — CBC WITH DIFFERENTIAL/PLATELET
Abs Immature Granulocytes: 0.02 10*3/uL (ref 0.00–0.07)
Basophils Absolute: 0 10*3/uL (ref 0.0–0.1)
Basophils Relative: 0 %
Eosinophils Absolute: 0.4 10*3/uL (ref 0.0–0.5)
Eosinophils Relative: 5 %
HCT: 39.8 % (ref 36.0–46.0)
Hemoglobin: 13.6 g/dL (ref 12.0–15.0)
Immature Granulocytes: 0 %
Lymphocytes Relative: 22 %
Lymphs Abs: 1.9 10*3/uL (ref 0.7–4.0)
MCH: 33.1 pg (ref 26.0–34.0)
MCHC: 34.2 g/dL (ref 30.0–36.0)
MCV: 96.8 fL (ref 80.0–100.0)
Monocytes Absolute: 0.6 10*3/uL (ref 0.1–1.0)
Monocytes Relative: 7 %
Neutro Abs: 5.6 10*3/uL (ref 1.7–7.7)
Neutrophils Relative %: 66 %
Platelets: 227 10*3/uL (ref 150–400)
RBC: 4.11 MIL/uL (ref 3.87–5.11)
RDW: 12.8 % (ref 11.5–15.5)
WBC: 8.5 10*3/uL (ref 4.0–10.5)
nRBC: 0 % (ref 0.0–0.2)

## 2020-01-14 LAB — CMP (CANCER CENTER ONLY)
ALT: 16 U/L (ref 0–44)
AST: 18 U/L (ref 15–41)
Albumin: 3.7 g/dL (ref 3.5–5.0)
Alkaline Phosphatase: 67 U/L (ref 38–126)
Anion gap: 10 (ref 5–15)
BUN: 12 mg/dL (ref 6–20)
CO2: 25 mmol/L (ref 22–32)
Calcium: 8.9 mg/dL (ref 8.9–10.3)
Chloride: 107 mmol/L (ref 98–111)
Creatinine: 0.71 mg/dL (ref 0.44–1.00)
GFR, Est AFR Am: >60 mL/min (ref >60)
GFR, Estimated: >60 mL/min (ref >60)
Glucose, Bld: 85 mg/dL (ref 70–99)
Potassium: 3.7 mmol/L (ref 3.5–5.1)
Sodium: 142 mmol/L (ref 135–145)
Total Bilirubin: 1.2 mg/dL (ref 0.3–1.2)
Total Protein: 6.7 g/dL (ref 6.5–8.1)

## 2020-01-14 LAB — SEDIMENTATION RATE: Sed Rate: 8 mm/hr (ref 0–22)

## 2020-01-14 NOTE — Telephone Encounter (Signed)
Scheduled appt per 4/30 los.  Left a vm of the appt dates and time.

## 2020-02-15 ENCOUNTER — Encounter: Payer: Self-pay | Admitting: Family Medicine

## 2020-02-25 ENCOUNTER — Inpatient Hospital Stay: Payer: No Typology Code available for payment source | Attending: Hematology

## 2020-02-25 ENCOUNTER — Other Ambulatory Visit: Payer: Self-pay

## 2020-02-25 DIAGNOSIS — D500 Iron deficiency anemia secondary to blood loss (chronic): Secondary | ICD-10-CM

## 2020-02-25 DIAGNOSIS — C8118 Nodular sclerosis classical Hodgkin lymphoma, lymph nodes of multiple sites: Secondary | ICD-10-CM | POA: Diagnosis present

## 2020-02-25 DIAGNOSIS — Z452 Encounter for adjustment and management of vascular access device: Secondary | ICD-10-CM | POA: Insufficient documentation

## 2020-02-25 DIAGNOSIS — Z95828 Presence of other vascular implants and grafts: Secondary | ICD-10-CM

## 2020-02-25 DIAGNOSIS — Z7189 Other specified counseling: Secondary | ICD-10-CM

## 2020-02-25 MED ORDER — SODIUM CHLORIDE 0.9% FLUSH
10.0000 mL | Freq: Once | INTRAVENOUS | Status: AC
  Administered 2020-02-25: 10 mL
  Filled 2020-02-25: qty 10

## 2020-02-25 MED ORDER — HEPARIN SOD (PORK) LOCK FLUSH 100 UNIT/ML IV SOLN
500.0000 [IU] | Freq: Once | INTRAVENOUS | Status: AC
  Administered 2020-02-25: 500 [IU]
  Filled 2020-02-25: qty 5

## 2020-03-15 ENCOUNTER — Ambulatory Visit: Payer: No Typology Code available for payment source | Admitting: Family Medicine

## 2020-03-27 ENCOUNTER — Other Ambulatory Visit: Payer: Self-pay | Admitting: Hematology

## 2020-03-27 DIAGNOSIS — C8118 Nodular sclerosis classical Hodgkin lymphoma, lymph nodes of multiple sites: Secondary | ICD-10-CM

## 2020-03-27 NOTE — Progress Notes (Signed)
PETCT

## 2020-03-31 ENCOUNTER — Other Ambulatory Visit: Payer: Self-pay

## 2020-03-31 ENCOUNTER — Inpatient Hospital Stay: Payer: No Typology Code available for payment source

## 2020-03-31 ENCOUNTER — Inpatient Hospital Stay: Payer: No Typology Code available for payment source | Attending: Hematology

## 2020-03-31 DIAGNOSIS — D500 Iron deficiency anemia secondary to blood loss (chronic): Secondary | ICD-10-CM

## 2020-03-31 DIAGNOSIS — C8111 Nodular sclerosis classical Hodgkin lymphoma, lymph nodes of head, face, and neck: Secondary | ICD-10-CM | POA: Insufficient documentation

## 2020-03-31 DIAGNOSIS — C8110 Nodular sclerosis classical Hodgkin lymphoma, unspecified site: Secondary | ICD-10-CM

## 2020-03-31 DIAGNOSIS — Z7189 Other specified counseling: Secondary | ICD-10-CM

## 2020-03-31 DIAGNOSIS — Z95828 Presence of other vascular implants and grafts: Secondary | ICD-10-CM

## 2020-03-31 LAB — CMP (CANCER CENTER ONLY)
ALT: 17 U/L (ref 0–44)
AST: 14 U/L — ABNORMAL LOW (ref 15–41)
Albumin: 4 g/dL (ref 3.5–5.0)
Alkaline Phosphatase: 76 U/L (ref 38–126)
Anion gap: 10 (ref 5–15)
BUN: 11 mg/dL (ref 6–20)
CO2: 23 mmol/L (ref 22–32)
Calcium: 9.2 mg/dL (ref 8.9–10.3)
Chloride: 107 mmol/L (ref 98–111)
Creatinine: 0.74 mg/dL (ref 0.44–1.00)
GFR, Est AFR Am: >60 mL/min (ref >60)
GFR, Estimated: >60 mL/min (ref >60)
Glucose, Bld: 84 mg/dL (ref 70–99)
Potassium: 3.9 mmol/L (ref 3.5–5.1)
Sodium: 140 mmol/L (ref 135–145)
Total Bilirubin: 1.1 mg/dL (ref 0.3–1.2)
Total Protein: 7.3 g/dL (ref 6.5–8.1)

## 2020-03-31 LAB — CBC WITH DIFFERENTIAL/PLATELET
Abs Immature Granulocytes: 0.02 10*3/uL (ref 0.00–0.07)
Basophils Absolute: 0 10*3/uL (ref 0.0–0.1)
Basophils Relative: 0 %
Eosinophils Absolute: 0.2 10*3/uL (ref 0.0–0.5)
Eosinophils Relative: 3 %
HCT: 42.1 % (ref 36.0–46.0)
Hemoglobin: 14.5 g/dL (ref 12.0–15.0)
Immature Granulocytes: 0 %
Lymphocytes Relative: 32 %
Lymphs Abs: 2.6 10*3/uL (ref 0.7–4.0)
MCH: 32.3 pg (ref 26.0–34.0)
MCHC: 34.4 g/dL (ref 30.0–36.0)
MCV: 93.8 fL (ref 80.0–100.0)
Monocytes Absolute: 0.5 10*3/uL (ref 0.1–1.0)
Monocytes Relative: 7 %
Neutro Abs: 4.6 10*3/uL (ref 1.7–7.7)
Neutrophils Relative %: 58 %
Platelets: 230 10*3/uL (ref 150–400)
RBC: 4.49 MIL/uL (ref 3.87–5.11)
RDW: 12 % (ref 11.5–15.5)
WBC: 8 10*3/uL (ref 4.0–10.5)
nRBC: 0 % (ref 0.0–0.2)

## 2020-03-31 MED ORDER — SODIUM CHLORIDE 0.9% FLUSH
10.0000 mL | Freq: Once | INTRAVENOUS | Status: AC
  Administered 2020-03-31: 10 mL
  Filled 2020-03-31: qty 10

## 2020-03-31 MED ORDER — HEPARIN SOD (PORK) LOCK FLUSH 100 UNIT/ML IV SOLN
500.0000 [IU] | Freq: Once | INTRAVENOUS | Status: AC
  Administered 2020-03-31: 500 [IU]
  Filled 2020-03-31: qty 5

## 2020-04-04 ENCOUNTER — Ambulatory Visit (HOSPITAL_COMMUNITY)
Admission: RE | Admit: 2020-04-04 | Discharge: 2020-04-04 | Disposition: A | Discharge status: Home / Self Care | Payer: No Typology Code available for payment source | Source: Ambulatory Visit | Attending: Hematology | Admitting: Hematology

## 2020-04-04 ENCOUNTER — Other Ambulatory Visit: Payer: Self-pay

## 2020-04-04 DIAGNOSIS — C8118 Nodular sclerosis classical Hodgkin lymphoma, lymph nodes of multiple sites: Secondary | ICD-10-CM | POA: Insufficient documentation

## 2020-04-04 LAB — GLUCOSE, CAPILLARY: Glucose-Capillary: 77 mg/dL (ref 70–99)

## 2020-04-04 MED ORDER — FLUDEOXYGLUCOSE F - 18 (FDG) INJECTION
10.3000 | Freq: Once | INTRAVENOUS | Status: AC
  Administered 2020-04-04: 10.3 via INTRAVENOUS

## 2020-04-05 NOTE — Progress Notes (Signed)
HEMATOLOGY/ONCOLOGY CLINIC NOTE  Date of Service: 04/07/20   Patient Care Team: [name], MD as PCP - General (Family Medicine)  CHIEF COMPLAINTS/PURPOSE OF CONSULTATION:   Continue management of classical hodgkins lymphoma   CURRENT TREATMENT: Active surveillance   HISTORY OF PRESENTING ILLNESS:   Elizabeth Beasley is a wonderful 39 y.o. female who has been referred to [place] by Dr Madilyn Fireman for evaluation and management of microcytic anemia/cervical lymphadenopathy.The pt reports that she has a history of iron deficiency but has never had a transfusion. Pt has had two pregnancies and has two children, ages 1 and 60. There was a concern for anemia with her second pregnancy so she was given an erythropoietin shot. She has had a copper IUD for about 2 years and her menstrual cycle has been slightly longer since getting the IUD. She has been taking 28 mg TID PO iron since June. Pt has no known medication allergies and has had a previous removal of Bartholin's gland cyst. Pt has had no known contact with chemicals either through work or hobbies. She has no history of asthma or heavy smoke/aerosol exposure.   Pt did not notice any cervical lymphadenopathy in June. In late July, early August she began to experience extreme fatigue and could begin to feel small lumps in her neck. Her fatigue is improved with taking her PO Iron TID. She has experienced a couple of episodes of night sweats, with the latest occurring in late August. Pt has also had <10 lbs loss in 2 months. She has not had any rashes but is experiencing itching in her palms, broader itching after hot showers.   Pt is currently working from home. She has had her seasonal flu shot. Pt is not planning on having more children and is not concerned about fertility. Pt's husband would like to take a drive to the [facility] next month. Pt would not want any blood product transfusions, as she is a Jehovah's Witness. She  is comfortable with IV iron infusions.   INTERVAL HISTORY:  Elizabeth Beasley is a 39 y.o. female here for evaluation and management of hodgkins lymphoma. The patient's last visit with [place] was on 01/14/20. The pt reports that she is doing well overall.  The pt reports she is good. Pt has gotten both doses of the COVID19 vaccines. 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 She also may attribute the neuropathy to her working out.   Of note since the patient's last visit, pt has had PET Skull Base to Thigh ([id number]) completed on 04/04/20 with results revealing "1. New low level hypermetabolism identified in lymph nodes of the left axilla. The nodes are unenlarged by CT criteria and findings may reflect sequelae of left upper extremity COVID vaccination in the last 8 weeks. 2. The clustered lymph nodes in the anterior mediastinum are stable on CT imaging but shows slight increase in FDG accumulation today (Deauville 4). 3. Index precarinal node documented previously is mildly decreased in size and FDG accumulation ( Deauville 3). 4. No splenomegaly or marrow hypermetabolism. 5. Scattered areas of hypermetabolic brown fat in the neck and upper chest."  Lab results today (03/31/20) of CBC w/diff and CMP is as follows: all values are WNL except for AST at 14  On review of systems, pt reports staying active, healthy appetite and denies abdominal pain, irregular bowl habits, pedal edema and any other symptoms.   MEDICAL HISTORY:  Past Medical  History:  Diagnosis Date  . Anemia   . Bartholin gland cyst   . Hodgkin's lymphoma, nodular sclerosis ([place]) 06/08/2019  . Hx of seasonal allergies   . Lymphadenopathy, cervical 05/21/2019  . PONV (postoperative nausea and vomiting)   . 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 CESAREAN SECTION  2010  . MASS EXCISION Right  05/21/2019   Procedure: EXCISION OF RIGHT DEEP NECK LYMPH NODE;  Surgeon: Fanny Skates, MD;  Location: [place];  Service: General;  Laterality: Right;  . PORTACATH PLACEMENT Right 06/08/2019   Procedure: INSERTION PORT-A-CATH WITH ULTRASOUND GUIDANCE;  Surgeon: Fanny Skates, MD;  Location: [place];  Service: General;  Laterality: Right;  . URETHRAL CYST REMOVAL N/A 08/30/2016   Procedure: BARTHOLIN'S GLAND CYST EXCISION;  Surgeon: Ardis Hughs, MD;  Location: [facility];  Service: Urology;  Laterality: N/A;  . v back     2012    SOCIAL HISTORY: Social History   Socioeconomic History  . Marital status: Married    Spouse name: Montine Circle  . Number of children: 2  . Years of education: Assoc  . Highest education level: Not on file  Occupational History  . Occupation: CMA    Employer: [employer]  Tobacco Use  . Smoking status: Never Smoker  . Smokeless tobacco: Never Used  Vaping Use  . Vaping Use: Never used  Substance and Sexual Activity  . Alcohol use: Not Currently    Comment: ocassional  . Drug use: No  . Sexual activity: Yes    Partners: Male    Birth control/protection: I.U.D.  Other Topics Concern  . Not on file  Social History Narrative   Regular exercise. No regular caffeine.   Social Determinants of Health   Financial Resource Strain:   . Difficulty of Paying Living Expenses:   Food Insecurity:   . Worried About Charity fundraiser in the Last Year:   . Arboriculturist in the Last Year:   Transportation Needs:   . Film/video editor (Medical):   Marland Kitchen Lack of Transportation (Non-Medical):   Physical Activity:   . Days of Exercise per Week:   . Minutes of Exercise per Session:   Stress:   . Feeling of Stress :   Social Connections:   . Frequency of Communication with Friends and Family:   . Frequency of Social Gatherings with Friends and Family:   . Attends Religious Services:   . Active Member of Clubs or  Organizations:   . Attends Archivist Meetings:   Marland Kitchen Marital Status:   Intimate Partner Violence:   . Fear of Current or Ex-Partner:   . Emotionally Abused:   Marland Kitchen Physically Abused:   . Sexually Abused:     FAMILY HISTORY: Family History  Problem Relation Age of Onset  . Colon cancer Father 65  . Colon polyps Mother   . Hypertension Mother   . Diabetes Paternal Grandmother   . Stroke Paternal Grandmother   . Alcohol abuse Paternal Uncle   . Colon cancer Paternal Aunt 71  . Esophageal cancer Neg Hx   . Rectal cancer Neg Hx   . Stomach cancer Neg Hx    On PMHx the pt reports C-section, Bartholin's Gland Cyst removal. On Social Hx the pt reports non smoker, no alcohol use. On Family Hx the pt reports father passed  from colon cancer, mother has colon polyps.  ALLERGIES:  has No Known Allergies.  MEDICATIONS:  Current Outpatient Medications  Medication Sig Dispense Refill  . b complex vitamins capsule Take 1 capsule by mouth daily.    . cholecalciferol (VITAMIN D3) 25 MCG (1000 UT) tablet Take 2,000 Units by mouth daily.    Marland Kitchen dexamethasone (DECADRON) 4 MG tablet Take 2 tablets by mouth once a day on the day after chemotherapy and then take 2 tablets two times a day for 2 days. Take with food. 30 tablet 1  . fexofenadine (ALLEGRA) 180 MG tablet Take 180 mg by mouth daily as needed for allergies or rhinitis.    Marland Kitchen LORazepam (ATIVAN) 0.5 MG tablet Take 1 tablet (0.5 mg total) by mouth every 6 (six) hours as needed (Nausea or vomiting). 30 tablet 0  . mupirocin ointment (BACTROBAN) 2 % Place 1 application into the nose 2 (two) times daily. 22 g 1  . ondansetron (ZOFRAN) 8 MG tablet Take 1 tablet (8 mg total) by mouth 2 (two) times daily as needed. Start on the third day after chemotherapy. 30 tablet 1  . prochlorperazine (COMPAZINE) 10 MG tablet Take 1 tablet (10 mg total) by mouth every 6 (six) hours as needed (Nausea or vomiting). 30 tablet 1  . fluconazole (DIFLUCAN) 150 MG  tablet Take 1 tablet for a yeast infection if needed, may repeat in 3 days if needed (Patient not taking: Reported on 11/19/2019) 2 tablet 0  . LOTEMAX SM 0.38 % GEL     . potassium chloride SA (KLOR-CON) 20 MEQ tablet Take 1 tablet (20 mEq total) by mouth 2 (two) times daily. 60 tablet 1   No current facility-administered medications for this visit.   Facility-Administered Medications Ordered in Other Visits  Medication Dose Route Frequency Provider Last Rate Last Admin  . sodium chloride flush (NS) 0.9 % injection 10 mL  10 mL Intracatheter PRN Brunetta Genera, MD   10 mL at 08/05/19 1658    REVIEW OF SYSTEMS:   A 10+ POINT REVIEW OF SYSTEMS WAS OBTAINED including neurology, dermatology, psychiatry, cardiac, respiratory, lymph, extremities, GI, GU, Musculoskeletal, constitutional, breasts, reproductive, HEENT.  All pertinent positives are noted in the HPI.  All others are negative.   PHYSICAL EXAMINATION: ECOG FS:1 - Symptomatic but completely ambulatory  Vitals:   04/07/20 1048  BP: 124/75  Pulse: 75  Resp: 18  Temp: 97.9 F (36.6 C)  SpO2: 99%   Wt Readings from Last 3 Encounters:  04/07/20 (!) 221 lb 1.6 oz (100.3 kg)  01/14/20 220 lb 6.4 oz (100 kg)  12/03/19 214 lb 4 oz (97.2 kg)   Body mass index is 37.95 kg/m.    GENERAL:alert, in no acute distress and comfortable SKIN: no acute rashes, no significant lesions EYES: conjunctiva are pink and non-injected, sclera anicteric OROPHARYNX: MMM, no exudates, no oropharyngeal erythema or ulceration NECK: supple, no JVD LYMPH:  no palpable lymphadenopathy in the cervical, axillary or inguinal regions LUNGS: clear to auscultation b/l with normal respiratory effort HEART: regular rate & rhythm ABDOMEN:  normoactive bowel sounds , non tender, not distended. Extremity: no pedal edema PSYCH: alert & oriented x 3 with fluent speech NEURO: no focal motor/sensory deficits  LABORATORY DATA:  I have reviewed the data as  listed  - 05/14/2019 Fe+TIBC+Fer is as follows: Iron at 11, TIBC at 258, % Sat at 4, Ferritin at 50. -Discussed 05/21/2019 Lymph Node Bx ([id number]) which revealed " CLASSIC HODGKIN LYMPHOMA."  -  05/19/2019 CT C/A/P ([id number]) which revealed  "1. Enlarged mediastinal, bilateral hilar and lower cervical lymph nodes. Primary considerations include lymphoma, sarcoid and less likely metastatic disease. Normal spleen. No evidence of enlarged lymph nodes within the abdomen or pelvis. 2. Low lying IUD within the LOWER uterine segment and may extend to the cervix."  - 05/17/2019 CT Soft Tissue Neck w/contrast ([id number]) which revealed "Bulky right greater than left cervical and mediastinal lymphadenopathy concerning for lymphoma."  . CBC Latest Ref Rng & Units 03/31/2020 01/14/2020 12/03/2019  WBC 4.0 - 10.5 K/uL 8.0 8.5 6.2  Hemoglobin 12.0 - 15.0 g/dL 14.5 13.6 12.8  Hematocrit 36 - 46 % 42.1 39.8 38.2  Platelets 150 - 400 K/uL 230 227 240    . CMP Latest Ref Rng & Units 03/31/2020 01/14/2020 12/03/2019  Glucose 70 - 99 mg/dL 84 85 99  BUN 6 - 20 mg/dL 11 12 10   Creatinine 0.44 - 1.00 mg/dL 0.74 0.71 0.66  Sodium 135 - 145 mmol/L 140 142 140  Potassium 3.5 - 5.1 mmol/L 3.9 3.7 3.6  Chloride 98 - 111 mmol/L 107 107 107  CO2 22 - 32 mmol/L 23 25 25   Calcium 8.9 - 10.3 mg/dL 9.2 8.9 8.9  Total Protein 6.5 - 8.1 g/dL 7.3 6.7 6.4(L)  Total Bilirubin 0.3 - 1.2 mg/dL 1.1 1.2 0.4  Alkaline Phos 38 - 126 U/L 76 67 59  AST 15 - 41 U/L 14(L) 18 14(L)  ALT 0 - 44 U/L 17 16 18    . Lab Results  Component Value Date   IRON 79 07/09/2019   TIBC 221 (L) 07/09/2019   IRONPCTSAT 36 07/09/2019   (Iron and TIBC)  Lab Results  Component Value Date   FERRITIN 590 (H) 07/09/2019  08/13/2019 NM PET Image Restag (PS) Skull Base To Thigh (Accession [id number])   05/21/2019 ([id number]) Lymph Node Biopsy    RADIOGRAPHIC STUDIES: I have personally reviewed the radiological images as listed and agreed  with the findings in the report. NM PET Image Restag (PS) Skull Base To Thigh  Result Date: 04/05/2020 CLINICAL DATA:  Subsequent treatment strategy for hive since lymphoma. COVID vaccine 02/04/2020 in the left arm. EXAM: NUCLEAR MEDICINE PET SKULL BASE TO THIGH TECHNIQUE: 10.3 mCi F-18 FDG was injected intravenously. Full-ring PET imaging was performed from the skull base to thigh after the radiotracer. CT data was obtained and used for attenuation correction and anatomic localization. Fasting blood glucose: 77 mg/dl COMPARISON:  01/04/2020 FINDINGS: Mediastinal blood pool activity: SUV max 2.2 Liver activity: SUV max 3.4 NECK: No hypermetabolic lymph nodes in the neck. Foci of hypermetabolism are seen in the posterior neck bilaterally, overlying fat tissue on the fusion imaging and compatible with hypermetabolic brown fat. There is no cervical lymphadenopathy by CT imaging. Incidental CT findings: none CHEST: The mildly enlarged lymph nodes in the anterior mediastinum are stable on CT imaging. SUV max = 4.8 today (Deauville 4) compared to 2.8 previously. Index precarinal node measured previously at 2.3 cm short axis is 1.9 cm short axis today and demonstrates SUV max = 2.8 compared to 3.3 previously (Deauville 3). 9 mm short axis left axillary node on 52/4 retains a fatty hilum but is increased in size from 6 mm short axis on the prior study. This lymph node shows low level hypermetabolism with SUV max = 3.3. Additional small lymph nodes in the posterior left axillary space are hypermetabolic with SUV max = ranging from 2.4-3.8. These lymph nodes are not enlarged by CT  criteria. As in the neck, there are scattered foci of hypermetabolism in the fat of the supraclavicular regions bilaterally consistent with hypermetabolic brown fat. Incidental CT findings: Right Port-A-Cath tip is positioned in the SVC/RA junction no suspicious pulmonary nodule or mass. ABDOMEN/PELVIS: No abnormal hypermetabolic activity within  the liver, pancreas, adrenal glands, or spleen. No hypermetabolic lymph nodes in the abdomen or pelvis. Small focus of hypermetabolism identified in the right uterus, potentially related to a fibroid. This is similar to prior. Incidental CT findings: Scattered small hypoattenuating lesions in the liver are likely cysts. IUD visualized in the uterus. SKELETON: No focal hypermetabolic activity to suggest skeletal metastasis. Incidental CT findings: none IMPRESSION: 1. New low level hypermetabolism identified in lymph nodes of the left axilla. The nodes are unenlarged by CT criteria and findings may reflect sequelae of left upper extremity COVID vaccination in the last 8 weeks. 2. The clustered lymph nodes in the anterior mediastinum are stable on CT imaging but shows slight increase in FDG accumulation today (Deauville 4). 3. Index precarinal node documented previously is mildly decreased in size and FDG accumulation ( Deauville 3). 4. No splenomegaly or marrow hypermetabolism. 5. Scattered areas of hypermetabolic brown fat in the neck and upper chest. Electronically Signed   By: Misty Stanley M.D.   On: 04/05/2020 11:02    ASSESSMENT & PLAN:   1) Recently diagnosed Classical Hodgkins lymphoma Likely stage IIIB based on imaging thus far. 2) Microcytic Anemia due to anemia of chronic disease and Iron deficiency- hg improved to 13 3) Jehovah's Witness   PLAN: -Discussed pt labwork today, 03/31/20; of CBC w/diff and CMP is as follows: all values are WNL except for AST at 14 -Discussed 04/04/20 of PET Skull Base to Thigh ([id number])  -Advised on continue COVID19 precautions (mask, social distancing) with traveling  -Advised on brown fat -thermogenic function-lights up on PET scans  -Advised lymph nodes in chest have not changed in size or shrink, one lymph node became brighter but did not change in size (non-specific, likely inflammatory tissue) -Advised benign lymph node in left arm likely due to COVID19  vaccine -Advised will continue watch borderline lymph nodes  -Recommended that the pt continue to eat well, sleep enough, drink at least 48-64 oz of water each day, and walk 20-30 minutes each day.  -Goal equivalent to 10,000 steps a day  -Recommends taking multivitamin or Vitamin B complex and vitamin D supplement  -Will repeat scans in 4-6 months   -Will see back in 2 months   FOLLOW UP: RTC with Dr Irene Limbo with labs in 2 months  The total time spent in the appt was 20 minutes and more than 50% was on counseling and direct patient cares.  All of the patient's questions were answered with apparent satisfaction. The patient knows to call the clinic with any problems, questions or concerns.   Sullivan Lone MD MS AAHIVMS [facility] Hematology/Oncology Physician [facility]  (Office):       [phone] (Work cell):  [phone] (Fax):           [phone]  04/07/2020 12:19 PM  I, Dawayne Cirri am acting as a Education administrator for Dr. Sullivan Lone.   .I have reviewed the above documentation for accuracy and completeness, and I agree with the above. Brunetta Genera MD

## 2020-04-07 ENCOUNTER — Other Ambulatory Visit: Payer: Self-pay

## 2020-04-07 ENCOUNTER — Other Ambulatory Visit: Payer: Self-pay | Admitting: Hematology

## 2020-04-07 ENCOUNTER — Inpatient Hospital Stay (HOSPITAL_BASED_OUTPATIENT_CLINIC_OR_DEPARTMENT_OTHER): Payer: No Typology Code available for payment source | Admitting: Hematology

## 2020-04-07 VITALS — BP 124/75 | HR 75 | Temp 97.9°F | Resp 18 | Ht 64.0 in | Wt 221.1 lb

## 2020-04-07 DIAGNOSIS — C8110 Nodular sclerosis classical Hodgkin lymphoma, unspecified site: Secondary | ICD-10-CM

## 2020-04-07 DIAGNOSIS — C8111 Nodular sclerosis classical Hodgkin lymphoma, lymph nodes of head, face, and neck: Secondary | ICD-10-CM

## 2020-04-14 ENCOUNTER — Telehealth: Payer: Self-pay | Admitting: Hematology

## 2020-04-14 NOTE — Telephone Encounter (Signed)
Scheduled per 07/23 los, patient has been called and notified.

## 2020-04-26 ENCOUNTER — Other Ambulatory Visit (HOSPITAL_COMMUNITY)
Admission: RE | Admit: 2020-04-26 | Discharge: 2020-04-26 | Disposition: A | Discharge status: Home / Self Care | Payer: No Typology Code available for payment source | Source: Ambulatory Visit | Attending: Family Medicine | Admitting: Family Medicine

## 2020-04-26 ENCOUNTER — Encounter: Payer: Self-pay | Admitting: Family Medicine

## 2020-04-26 ENCOUNTER — Ambulatory Visit (INDEPENDENT_AMBULATORY_CARE_PROVIDER_SITE_OTHER): Payer: No Typology Code available for payment source | Admitting: Family Medicine

## 2020-04-26 ENCOUNTER — Other Ambulatory Visit: Payer: Self-pay

## 2020-04-26 VITALS — BP 132/77 | HR 97 | Ht 64.0 in | Wt 226.0 lb

## 2020-04-26 DIAGNOSIS — Z01419 Encounter for gynecological examination (general) (routine) without abnormal findings: Secondary | ICD-10-CM | POA: Insufficient documentation

## 2020-04-26 NOTE — Progress Notes (Signed)
diagnosed with hodgkin's lymphoma in 05/2019, just finished chemo

## 2020-04-26 NOTE — Progress Notes (Signed)
   GYNECOLOGY ANNUAL PREVENTATIVE CARE ENCOUNTER NOTE  Subjective:   Elizabeth Beasley is a 39 y.o. G85P2002 female here for a routine annual gynecologic exam.  Current complaints: none. Recently completed chemo for lymphoma. Had IUD placed in 2020, no menses during chemo and just restarted.  Denies abnormal vaginal bleeding, discharge, pelvic pain, problems with intercourse or other gynecologic concerns.    Gynecologic History No LMP recorded. (Menstrual status: IUD). Contraception: IUD Last Pap: 2018 Results were: normal Last mammogram: NA @40 -50  The following portions of the patient's history were reviewed and updated as appropriate: allergies, current medications, past family history, past medical history, past social history, past surgical history and problem list.  Review of Systems Pertinent items are noted in HPI.   Objective:  BP 132/77   Pulse 97   Ht 5\' 4"  (1.626 m)   Wt 226 lb (102.5 kg)   BMI 38.79 kg/m  CONSTITUTIONAL: Well-developed, well-nourished female in no acute distress.  HENT:  Normocephalic, atraumatic, External right and left ear normal. Oropharynx is clear and moist EYES:  No scleral icterus.  NECK: Normal range of motion, supple, no masses.  Normal thyroid.  SKIN: Skin is warm and dry. No rash noted. Not diaphoretic. No erythema. No pallor. NEUROLOGIC: Alert and oriented to person, place, and time. Normal reflexes, muscle tone coordination. No cranial nerve deficit noted. PSYCHIATRIC: Normal mood and affect. Normal behavior. Normal judgment and thought content. CARDIOVASCULAR: Normal heart rate noted, regular rhythm. 2+ distal pulses. RESPIRATORY: Effort and breath sounds normal, no problems with respiration noted. BREASTS: Symmetric in size. No masses, skin changes, nipple drainage, or lymphadenopathy.  Chemo port present on right upper chest ABDOMEN: Soft,  no distention noted.  No tenderness, rebound or guarding.  PELVIC: Normal appearing external  genitalia; normal appearing vaginal mucosa and cervix.  No abnormal discharge noted.  Pap smear obtained. IUD strings visualized.  Normal uterine size, no other palpable masses, no uterine or adnexal tenderness. MUSCULOSKELETAL: Normal range of motion.    Assessment and Plan:  1) Annual gynecologic examination with pap smear:  Will follow up results of pap smear and manage accordingly.Routine preventative health maintenance measures emphasized.  2) Contraception counseling: Reviewed all forms of birth control options available including abstinence; over the counter/barrier methods; hormonal contraceptive medication including pill, patch, ring, injection,contraceptive implant; hormonal and nonhormonal IUDs; permanent sterilization options including vasectomy and the various tubal sterilization modalities. Risks and benefits reviewed.  Questions were answered.  Written information was also given to the patient to review.  Patient desires IUD, this was prescribed for patient. She will follow up in  1 yr for surveillance.  She was told to call with any further questions, or with any concerns about this method of contraception.  Emphasized use of condoms 100% of the time for STI prevention.  Please refer to After Visit Summary for other counseling recommendations.   Return in about 1 year (around 04/26/2021) for Yearly wellness exam.  Caren Macadam, MD, MPH, ABFM Attending Physician Center for [facility]

## 2020-04-28 LAB — CYTOLOGY - PAP
Comment: NEGATIVE
Diagnosis: NEGATIVE
High risk HPV: NEGATIVE

## 2020-05-05 ENCOUNTER — Telehealth: Payer: Self-pay | Admitting: *Deleted

## 2020-05-05 NOTE — Telephone Encounter (Signed)
Called pt and advised of message below. Pt verbalized understanding

## 2020-05-05 NOTE — Telephone Encounter (Signed)
-----   Message from Brunetta Genera, MD sent at 05/05/2020  3:32 PM EDT ----- Patient is likely in remission from her Hodgkin's lymphoma.  He needs to continue with infection precautions and appropriate Covid precautions.  Could consider booster dose of Covid vaccine.  No significantly elevated risk of blood clots at this time in the absence of overt active disease. 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 Her question is, is she still at risk for blood clots and are there any limitations as to what she's to do?

## 2020-05-12 ENCOUNTER — Inpatient Hospital Stay: Payer: No Typology Code available for payment source | Attending: Hematology

## 2020-05-12 ENCOUNTER — Other Ambulatory Visit: Payer: Self-pay

## 2020-05-12 DIAGNOSIS — Z452 Encounter for adjustment and management of vascular access device: Secondary | ICD-10-CM | POA: Insufficient documentation

## 2020-05-12 DIAGNOSIS — D500 Iron deficiency anemia secondary to blood loss (chronic): Secondary | ICD-10-CM

## 2020-05-12 DIAGNOSIS — Z7189 Other specified counseling: Secondary | ICD-10-CM

## 2020-05-12 DIAGNOSIS — Z95828 Presence of other vascular implants and grafts: Secondary | ICD-10-CM

## 2020-05-12 DIAGNOSIS — C8111 Nodular sclerosis classical Hodgkin lymphoma, lymph nodes of head, face, and neck: Secondary | ICD-10-CM | POA: Diagnosis present

## 2020-05-12 MED ORDER — HEPARIN SOD (PORK) LOCK FLUSH 100 UNIT/ML IV SOLN
500.0000 [IU] | Freq: Once | INTRAVENOUS | Status: AC
  Administered 2020-05-12: 500 [IU]
  Filled 2020-05-12: qty 5

## 2020-05-12 MED ORDER — SODIUM CHLORIDE 0.9% FLUSH
10.0000 mL | Freq: Once | INTRAVENOUS | Status: AC
  Administered 2020-05-12: 10 mL
  Filled 2020-05-12: qty 10

## 2020-05-12 NOTE — Patient Instructions (Signed)

## 2020-06-09 ENCOUNTER — Ambulatory Visit: Payer: Self-pay

## 2020-06-09 ENCOUNTER — Other Ambulatory Visit: Payer: Self-pay

## 2020-06-09 ENCOUNTER — Inpatient Hospital Stay: Payer: No Typology Code available for payment source

## 2020-06-09 ENCOUNTER — Inpatient Hospital Stay (HOSPITAL_BASED_OUTPATIENT_CLINIC_OR_DEPARTMENT_OTHER): Payer: No Typology Code available for payment source | Admitting: Hematology

## 2020-06-09 ENCOUNTER — Inpatient Hospital Stay: Payer: No Typology Code available for payment source | Attending: Hematology

## 2020-06-09 VITALS — BP 107/66 | HR 76 | Temp 98.6°F | Resp 18 | Ht 64.0 in | Wt 229.0 lb

## 2020-06-09 DIAGNOSIS — Z79899 Other long term (current) drug therapy: Secondary | ICD-10-CM | POA: Insufficient documentation

## 2020-06-09 DIAGNOSIS — Z95828 Presence of other vascular implants and grafts: Secondary | ICD-10-CM

## 2020-06-09 DIAGNOSIS — D509 Iron deficiency anemia, unspecified: Secondary | ICD-10-CM | POA: Diagnosis not present

## 2020-06-09 DIAGNOSIS — E611 Iron deficiency: Secondary | ICD-10-CM | POA: Insufficient documentation

## 2020-06-09 DIAGNOSIS — C8111 Nodular sclerosis classical Hodgkin lymphoma, lymph nodes of head, face, and neck: Secondary | ICD-10-CM | POA: Insufficient documentation

## 2020-06-09 DIAGNOSIS — C8110 Nodular sclerosis classical Hodgkin lymphoma, unspecified site: Secondary | ICD-10-CM | POA: Diagnosis not present

## 2020-06-09 DIAGNOSIS — Z7189 Other specified counseling: Secondary | ICD-10-CM

## 2020-06-09 DIAGNOSIS — D500 Iron deficiency anemia secondary to blood loss (chronic): Secondary | ICD-10-CM

## 2020-06-09 LAB — CMP (CANCER CENTER ONLY)
ALT: 15 U/L (ref 0–44)
AST: 15 U/L (ref 15–41)
Albumin: 3.7 g/dL (ref 3.5–5.0)
Alkaline Phosphatase: 59 U/L (ref 38–126)
Anion gap: 6 (ref 5–15)
BUN: 10 mg/dL (ref 6–20)
CO2: 28 mmol/L (ref 22–32)
Calcium: 9 mg/dL (ref 8.9–10.3)
Chloride: 105 mmol/L (ref 98–111)
Creatinine: 0.7 mg/dL (ref 0.44–1.00)
GFR, Est AFR Am: >60 mL/min (ref >60)
GFR, Estimated: >60 mL/min (ref >60)
Glucose, Bld: 79 mg/dL (ref 70–99)
Potassium: 4 mmol/L (ref 3.5–5.1)
Sodium: 139 mmol/L (ref 135–145)
Total Bilirubin: 1.7 mg/dL — ABNORMAL HIGH (ref 0.3–1.2)
Total Protein: 7 g/dL (ref 6.5–8.1)

## 2020-06-09 LAB — CBC WITH DIFFERENTIAL/PLATELET
Abs Immature Granulocytes: 0.02 10*3/uL (ref 0.00–0.07)
Basophils Absolute: 0 10*3/uL (ref 0.0–0.1)
Basophils Relative: 0 %
Eosinophils Absolute: 0.2 10*3/uL (ref 0.0–0.5)
Eosinophils Relative: 2 %
HCT: 39.1 % (ref 36.0–46.0)
Hemoglobin: 13.6 g/dL (ref 12.0–15.0)
Immature Granulocytes: 0 %
Lymphocytes Relative: 32 %
Lymphs Abs: 2.8 10*3/uL (ref 0.7–4.0)
MCH: 32.9 pg (ref 26.0–34.0)
MCHC: 34.8 g/dL (ref 30.0–36.0)
MCV: 94.7 fL (ref 80.0–100.0)
Monocytes Absolute: 0.4 10*3/uL (ref 0.1–1.0)
Monocytes Relative: 5 %
Neutro Abs: 5.4 10*3/uL (ref 1.7–7.7)
Neutrophils Relative %: 61 %
Platelets: 274 10*3/uL (ref 150–400)
RBC: 4.13 MIL/uL (ref 3.87–5.11)
RDW: 12.2 % (ref 11.5–15.5)
WBC: 8.8 10*3/uL (ref 4.0–10.5)
nRBC: 0 % (ref 0.0–0.2)

## 2020-06-09 LAB — SEDIMENTATION RATE: Sed Rate: 9 mm/hr (ref 0–22)

## 2020-06-09 MED ORDER — SODIUM CHLORIDE 0.9% FLUSH
10.0000 mL | Freq: Once | INTRAVENOUS | Status: AC
  Administered 2020-06-09: 10 mL
  Filled 2020-06-09: qty 10

## 2020-06-09 MED ORDER — HEPARIN SOD (PORK) LOCK FLUSH 100 UNIT/ML IV SOLN
500.0000 [IU] | Freq: Once | INTRAVENOUS | Status: AC
  Administered 2020-06-09: 500 [IU]
  Filled 2020-06-09: qty 5

## 2020-06-09 NOTE — Patient Instructions (Signed)

## 2020-06-09 NOTE — Progress Notes (Signed)
HEMATOLOGY/ONCOLOGY CLINIC NOTE  Date of Service: 06/09/20   Patient Care Team: [name], MD as PCP - General (Family Medicine)  CHIEF COMPLAINTS/PURPOSE OF CONSULTATION:   Continue management of classical hodgkins lymphoma   CURRENT TREATMENT: Active surveillance   HISTORY OF PRESENTING ILLNESS:   Elizabeth Beasley is a wonderful 39 y.o. female who has been referred to [place] by Dr Madilyn Fireman for evaluation and management of microcytic anemia/cervical lymphadenopathy.The pt reports that she has a history of iron deficiency but has never had a transfusion. Pt has had two pregnancies and has two children, ages 71 and 10. There was a concern for anemia with her second pregnancy so she was given an erythropoietin shot. She has had a copper IUD for about 2 years and her menstrual cycle has been slightly longer since getting the IUD. She has been taking 28 mg TID PO iron since June. Pt has no known medication allergies and has had a previous removal of Bartholin's gland cyst. Pt has had no known contact with chemicals either through work or hobbies. She has no history of asthma or heavy smoke/aerosol exposure.   Pt did not notice any cervical lymphadenopathy in June. In late July, early August she began to experience extreme fatigue and could begin to feel small lumps in her neck. Her fatigue is improved with taking her PO Iron TID. She has experienced a couple of episodes of night sweats, with the latest occurring in late August. Pt has also had <10 lbs loss in 2 months. She has not had any rashes but is experiencing itching in her palms, broader itching after hot showers.   Pt is currently working from home. She has had her seasonal flu shot. Pt is not planning on having more children and is not concerned about fertility. Pt's husband would like to take a drive to the [facility] next month. Pt would not want any blood product transfusions, as she is a Jehovah's Witness. She  is comfortable with IV iron infusions.   INTERVAL HISTORY: Elizabeth Beasley is a 39 y.o. female here for evaluation and management of Hodgkins Lymphoma. The patient's last visit with [place] was on 04/07/2020. The pt reports that she is doing well overall.  The pt reports that she is active daily and is eating well. Pt received the COVID19 booster last Friday and experienced fevers, chills, nausea, headache, and fatigue for four days. She denies any of these symptoms currently. Her underarm abscesses have resolved.  Her mother was recently diagnosed with DCIS. She also completed genetic testing and did not have BRCA mutations. Her father was diagnosed with Colon cancer at 51.  Lab results today (06/09/20) of CBC w/diff and CMP is as follows: all values are WNL except for Total Bilirubin at 1.7. 06/09/2020 Sed Rate at 9  On review of systems, pt denies fevers, chills, night sweats, new lumps/bumps and any other symptoms.   MEDICAL HISTORY:  Past Medical History:  Diagnosis Date  . Anemia   . Bartholin gland cyst   . 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 Hx of seasonal allergies   . Lymphadenopathy, cervical 05/21/2019  . PONV (postoperative nausea and vomiting)   . 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 CESAREAN SECTION  2010  . MASS EXCISION Right 05/21/2019   Procedure: EXCISION OF RIGHT DEEP NECK LYMPH  NODE;  Surgeon: Fanny Skates, MD;  Location: [place];  Service: General;  Laterality: Right;  . PORTACATH PLACEMENT Right 06/08/2019   Procedure: INSERTION PORT-A-CATH WITH ULTRASOUND GUIDANCE;  Surgeon: Fanny Skates, MD;  Location: [place];  Service: General;  Laterality: Right;  . URETHRAL CYST REMOVAL N/A 08/30/2016   Procedure: BARTHOLIN'S GLAND CYST EXCISION;  Surgeon: Ardis Hughs, MD;  Location: [facility];  Service:  Urology;  Laterality: N/A;  . v back     2012    SOCIAL HISTORY: Social History   Socioeconomic History  . Marital status: Married    Spouse name: Montine Circle  . Number of children: 2  . Years of education: Assoc  . Highest education level: Not on file  Occupational History  . Occupation: CMA    Employer: [employer]  Tobacco Use  . Smoking status: Never Smoker  . Smokeless tobacco: Never Used  Vaping Use  . Vaping Use: Never used  Substance and Sexual Activity  . Alcohol use: Not Currently    Comment: ocassional  . Drug use: No  . Sexual activity: Yes    Partners: Male    Birth control/protection: I.U.D.  Other Topics Concern  . Not on file  Social History Narrative   Regular exercise. No regular caffeine.   Social Determinants of Health   Financial Resource Strain:   . Difficulty of Paying Living Expenses: Not on file  Food Insecurity:   . Worried About Charity fundraiser in the Last Year: Not on file  . Ran Out of Food in the Last Year: Not on file  Transportation Needs:   . Lack of Transportation (Medical): Not on file  . Lack of Transportation (Non-Medical): Not on file  Physical Activity:   . Days of Exercise per Week: Not on file  . Minutes of Exercise per Session: Not on file  Stress:   . Feeling of Stress : Not on file  Social Connections:   . Frequency of Communication with Friends and Family: Not on file  . Frequency of Social Gatherings with Friends and Family: Not on file  . Attends Religious Services: Not on file  . Active Member of Clubs or Organizations: Not on file  . Attends Archivist Meetings: Not on file  . Marital Status: Not on file  Intimate Partner Violence:   . Fear of Current or Ex-Partner: Not on file  . Emotionally Abused: Not on file  . Physically Abused: Not on file  . Sexually Abused: Not on file    FAMILY HISTORY: Family History  Problem Relation Age of Onset  . Colon cancer Father 31  . Colon polyps Mother   .  Hypertension Mother   . Diabetes Paternal Grandmother   . Stroke Paternal Grandmother   . Alcohol abuse Paternal Uncle   . Colon cancer Paternal Aunt 41  . Esophageal cancer Neg Hx   . Rectal cancer Neg Hx   . Stomach cancer Neg Hx    On PMHx the pt reports C-section, Bartholin's Gland Cyst removal. On Social Hx the pt reports non smoker, no alcohol use. On Family Hx the pt reports father passed from colon cancer, mother has colon polyps.  ALLERGIES:  has No Known Allergies.  MEDICATIONS:  Current Outpatient Medications  Medication Sig Dispense Refill  . b complex vitamins capsule Take 1 capsule by mouth daily.    . cholecalciferol (VITAMIN D3) 25 MCG (1000 UT) tablet Take 2,000 Units by mouth  daily.    . dexamethasone (DECADRON) 4 MG tablet Take 2 tablets by mouth once a day on the day after chemotherapy and then take 2 tablets two times a day for 2 days. Take with food. 30 tablet 1  . fexofenadine (ALLEGRA) 180 MG tablet Take 180 mg by mouth daily as needed for allergies or rhinitis.    Marland Kitchen LORazepam (ATIVAN) 0.5 MG tablet Take 1 tablet (0.5 mg total) by mouth every 6 (six) hours as needed (Nausea or vomiting). 30 tablet 0  . mupirocin ointment (BACTROBAN) 2 % Place 1 application into the nose 2 (two) times daily. 22 g 1  . ondansetron (ZOFRAN) 8 MG tablet Take 1 tablet (8 mg total) by mouth 2 (two) times daily as needed. Start on the third day after chemotherapy. 30 tablet 1  . prochlorperazine (COMPAZINE) 10 MG tablet Take 1 tablet (10 mg total) by mouth every 6 (six) hours as needed (Nausea or vomiting). 30 tablet 1   No current facility-administered medications for this visit.   Facility-Administered Medications Ordered in Other Visits  Medication Dose Route Frequency Provider Last Rate Last Admin  . sodium chloride flush (NS) 0.9 % injection 10 mL  10 mL Intracatheter PRN Brunetta Genera, MD   10 mL at 08/05/19 1658    REVIEW OF SYSTEMS:   A 10+ POINT REVIEW OF SYSTEMS  WAS OBTAINED including neurology, dermatology, psychiatry, cardiac, respiratory, lymph, extremities, GI, GU, Musculoskeletal, constitutional, breasts, reproductive, HEENT.  All pertinent positives are noted in the HPI.  All others are negative.   PHYSICAL EXAMINATION: ECOG FS:1 - Symptomatic but completely ambulatory  Vitals:   06/09/20 1012  BP: 107/66  Pulse: 76  Resp: 18  Temp: 98.6 F (37 C)  SpO2: 100%   Wt Readings from Last 3 Encounters:  06/09/20 229 lb (103.9 kg)  04/26/20 226 lb (102.5 kg)  04/07/20 (!) 221 lb 1.6 oz (100.3 kg)   Body mass index is 39.31 kg/m.    GENERAL:alert, in no acute distress and comfortable SKIN: no acute rashes, no significant lesions EYES: conjunctiva are pink and non-injected, sclera anicteric OROPHARYNX: MMM, no exudates, no oropharyngeal erythema or ulceration NECK: supple, no JVD LYMPH:  no palpable lymphadenopathy in the cervical, axillary or inguinal regions LUNGS: clear to auscultation b/l with normal respiratory effort HEART: regular rate & rhythm ABDOMEN:  normoactive bowel sounds , non tender, not distended. No palpable hepatosplenomegaly.  Extremity: no pedal edema PSYCH: alert & oriented x 3 with fluent speech NEURO: no focal motor/sensory deficits  LABORATORY DATA:  I have reviewed the data as listed  - 05/14/2019 Fe+TIBC+Fer is as follows: Iron at 11, TIBC at 258, % Sat at 4, Ferritin at 50. -Discussed 05/21/2019 Lymph Node Bx ([id number]) which revealed " CLASSIC HODGKIN LYMPHOMA."  - 05/19/2019 CT C/A/P ([id number]) which revealed  "1. Enlarged mediastinal, bilateral hilar and lower cervical lymph nodes. Primary considerations include lymphoma, sarcoid and less likely metastatic disease. Normal spleen. No evidence of enlarged lymph nodes within the abdomen or pelvis. 2. Low lying IUD within the LOWER uterine segment and may extend to the cervix."  - 05/17/2019 CT Soft Tissue Neck w/contrast ([id number]) which revealed  "Bulky right greater than left cervical and mediastinal lymphadenopathy concerning for lymphoma."  . CBC Latest Ref Rng & Units 06/09/2020 03/31/2020 01/14/2020  WBC 4.0 - 10.5 K/uL 8.8 8.0 8.5  Hemoglobin 12.0 - 15.0 g/dL 13.6 14.5 13.6  Hematocrit 36 - 46 % 39.1 42.1 39.8  Platelets 150 -  400 K/uL 274 230 227    . CMP Latest Ref Rng & Units 06/09/2020 03/31/2020 01/14/2020  Glucose 70 - 99 mg/dL 79 84 85  BUN 6 - 20 mg/dL 10 11 12   Creatinine 0.44 - 1.00 mg/dL 0.70 0.74 0.71  Sodium 135 - 145 mmol/L 139 140 142  Potassium 3.5 - 5.1 mmol/L 4.0 3.9 3.7  Chloride 98 - 111 mmol/L 105 107 107  CO2 22 - 32 mmol/L 28 23 25   Calcium 8.9 - 10.3 mg/dL 9.0 9.2 8.9  Total Protein 6.5 - 8.1 g/dL 7.0 7.3 6.7  Total Bilirubin 0.3 - 1.2 mg/dL 1.7(H) 1.1 1.2  Alkaline Phos 38 - 126 U/L 59 76 67  AST 15 - 41 U/L 15 14(L) 18  ALT 0 - 44 U/L 15 17 16    . Lab Results  Component Value Date   IRON 79 07/09/2019   TIBC 221 (L) 07/09/2019   IRONPCTSAT 36 07/09/2019   (Iron and TIBC)  Lab Results  Component Value Date   FERRITIN 590 (H) 07/09/2019  08/13/2019 NM PET Image Restag (PS) Skull Base To Thigh (Accession [id number])   05/21/2019 ([id number]) Lymph Node Biopsy    RADIOGRAPHIC STUDIES: I have personally reviewed the radiological images as listed and agreed with the findings in the report. No results found.  ASSESSMENT & PLAN:   1) Recently diagnosed Classical Hodgkins lymphoma Likely stage IIIB based on imaging thus far. 2) Microcytic Anemia due to anemia of chronic disease and Iron deficiency- hg improved to 13 3) Jehovah's Witness   PLAN: -Discussed pt labwork today, 06/09/20; blood counts are nml, Bilirubin is borderline elevated, Sed Rate at [facility]. -No lab or clinical evidence of Hodgkin's Lymphoma recurrence at this time. Will continue watchful observation.  -Recommend pt receive the annual flu vaccine. Wait two weeks after COVID19 booster if possible.  -Recommend pt stay up to  date with age appropriate cancer screenings. -Will get PET/CT in 11 weeks  -Will see back in 12 weeks with labs   FOLLOW UP: PET/CT in 11 weeks RTC with Dr Irene Limbo with labs in 12 weeks   The total time spent in the appt was 20 minutes and more than 50% was on counseling and direct patient cares.  All of the patient's questions were answered with apparent satisfaction. The patient knows to call the clinic with any problems, questions or concerns.   Sullivan Lone MD [place] AAHIVMS [facility] Hematology/Oncology Physician [facility]  (Office):       [phone] (Work cell):  [phone] (Fax):           [phone]  06/09/2020 11:49 AM  I, Yevette Edwards, am acting as a scribe for Dr. Sullivan Lone.   .I have reviewed the above documentation for accuracy and completeness, and I agree with the above. Brunetta Genera MD

## 2020-07-17 ENCOUNTER — Telehealth: Payer: Self-pay | Admitting: Hematology

## 2020-07-17 NOTE — Telephone Encounter (Signed)
Called pt per 10/29 sch msg - unable to reach pt - left message for patient to call back to schedule an appt.

## 2020-07-24 ENCOUNTER — Other Ambulatory Visit: Payer: Self-pay

## 2020-07-24 ENCOUNTER — Inpatient Hospital Stay: Payer: No Typology Code available for payment source | Attending: Hematology

## 2020-07-24 DIAGNOSIS — C8111 Nodular sclerosis classical Hodgkin lymphoma, lymph nodes of head, face, and neck: Secondary | ICD-10-CM | POA: Diagnosis present

## 2020-07-24 DIAGNOSIS — Z95828 Presence of other vascular implants and grafts: Secondary | ICD-10-CM

## 2020-07-24 DIAGNOSIS — Z452 Encounter for adjustment and management of vascular access device: Secondary | ICD-10-CM | POA: Insufficient documentation

## 2020-07-24 DIAGNOSIS — Z7189 Other specified counseling: Secondary | ICD-10-CM

## 2020-07-24 DIAGNOSIS — D500 Iron deficiency anemia secondary to blood loss (chronic): Secondary | ICD-10-CM

## 2020-07-24 MED ORDER — HEPARIN SOD (PORK) LOCK FLUSH 100 UNIT/ML IV SOLN
500.0000 [IU] | Freq: Once | INTRAVENOUS | Status: AC
  Administered 2020-07-24: 500 [IU]
  Filled 2020-07-24: qty 5

## 2020-07-24 MED ORDER — SODIUM CHLORIDE 0.9% FLUSH
10.0000 mL | Freq: Once | INTRAVENOUS | Status: AC
  Administered 2020-07-24: 10 mL
  Filled 2020-07-24: qty 10

## 2020-08-24 ENCOUNTER — Other Ambulatory Visit: Payer: Self-pay | Admitting: Family Medicine

## 2020-08-24 ENCOUNTER — Other Ambulatory Visit: Payer: Self-pay

## 2020-08-24 ENCOUNTER — Telehealth (INDEPENDENT_AMBULATORY_CARE_PROVIDER_SITE_OTHER): Payer: No Typology Code available for payment source | Admitting: Family Medicine

## 2020-08-24 DIAGNOSIS — J019 Acute sinusitis, unspecified: Secondary | ICD-10-CM | POA: Diagnosis not present

## 2020-08-24 MED ORDER — AMOXICILLIN-POT CLAVULANATE 875-125 MG PO TABS: 1.0000 | ORAL_TABLET | Freq: Two times a day (BID) | ORAL | 0 refills | Status: DC

## 2020-08-24 MED FILL — AMOX-CLAV 875-125 MG TABLET: 875-125 | 14 days supply | Qty: 28 | Fill #0

## 2020-08-24 NOTE — Progress Notes (Signed)
Virtual Visit via Video Note  I connected with Elizabeth Beasley on 08/24/20 at  4:00 PM EST by a video enabled telemedicine application and verified that I am speaking with the correct person using two identifiers.   I discussed the limitations of evaluation and management by telemedicine and the availability of in person appointments. The patient expressed understanding and agreed to proceed.  Patient location: at home  Provider location: in office  Subjective:    CC:   HPI: Pt reports that she has been experiencing sxs since October. She stated that she thought that it was TMJ sxs. She has had flares since her 67s.  Usually gets popping and clicking. Never usually had pain but say had a jaw pain after it popped. Started having post nasal drip.  She has been congested. She developed into more intense pressure and pain on the  L sided pain behind her eye and L side of her head. She denies any f/s/c/n/v/d/body aches.  She has been using Flonase, Allegra, and warm compresses - did help some.    Past medical history, Surgical history, Family history not pertinant except as noted below, Social history, Allergies, and medications have been entered into the medical record, reviewed, and corrections made.   Review of Systems: No fevers, chills, night sweats, weight loss, chest pain, or shortness of breath.   Objective:    General: Speaking clearly in complete sentences without any shortness of breath.  Alert and oriented x3.  Normal judgment. No apparent acute distress.    Impression and Recommendations:    No problem-specific Assessment & Plan notes found for this encounter.  Acute sinusitis -treat with Augmentin.  If not better in 1 week please give [place] call back.  Continue symptomatic care.  She is actually scheduled for her PET scan tomorrow hopefully results will be good.   Time spent in encounter 15 minutes  I discussed the assessment and treatment plan with the patient. The  patient was provided an opportunity to ask questions and all were answered. The patient agreed with the plan and demonstrated an understanding of the instructions.   The patient was advised to call back or seek an in-person evaluation if the symptoms worsen or if the condition fails to improve as anticipated.   Beatrice Lecher, MD

## 2020-08-24 NOTE — Progress Notes (Signed)
Pt reports that she has been experiencing sxs since October. She stated that she thought that it was TMJ sxs.  She denies any f/s/c/n/v/d/body aches. She does c/o L sided pain behind her eye and L side of her head. She has been using Flonase, Allegra, and warm compresses.

## 2020-08-25 ENCOUNTER — Ambulatory Visit (HOSPITAL_COMMUNITY)
Admission: RE | Admit: 2020-08-25 | Discharge: 2020-08-25 | Disposition: A | Discharge status: Home / Self Care | Payer: No Typology Code available for payment source | Source: Ambulatory Visit | Attending: Hematology | Admitting: Hematology

## 2020-08-25 DIAGNOSIS — C8110 Nodular sclerosis classical Hodgkin lymphoma, unspecified site: Secondary | ICD-10-CM | POA: Diagnosis present

## 2020-08-25 LAB — GLUCOSE, CAPILLARY: Glucose-Capillary: 85 mg/dL (ref 70–99)

## 2020-08-25 MED ORDER — FLUDEOXYGLUCOSE F - 18 (FDG) INJECTION
10.9000 | Freq: Once | INTRAVENOUS | Status: AC | PRN
  Administered 2020-08-25: 10.9 via INTRAVENOUS

## 2020-08-28 ENCOUNTER — Telehealth: Payer: No Typology Code available for payment source | Admitting: Family Medicine

## 2020-09-01 ENCOUNTER — Inpatient Hospital Stay: Payer: No Typology Code available for payment source | Attending: Hematology

## 2020-09-01 ENCOUNTER — Other Ambulatory Visit: Payer: Self-pay | Admitting: Hematology

## 2020-09-01 ENCOUNTER — Inpatient Hospital Stay (HOSPITAL_BASED_OUTPATIENT_CLINIC_OR_DEPARTMENT_OTHER): Payer: No Typology Code available for payment source | Admitting: Hematology

## 2020-09-01 ENCOUNTER — Other Ambulatory Visit: Payer: Self-pay

## 2020-09-01 ENCOUNTER — Inpatient Hospital Stay: Payer: No Typology Code available for payment source

## 2020-09-01 VITALS — BP 125/70 | HR 109 | Temp 98.3°F | Resp 18 | Ht 64.0 in | Wt 222.5 lb

## 2020-09-01 DIAGNOSIS — C8110 Nodular sclerosis classical Hodgkin lymphoma, unspecified site: Secondary | ICD-10-CM

## 2020-09-01 DIAGNOSIS — D508 Other iron deficiency anemias: Secondary | ICD-10-CM | POA: Insufficient documentation

## 2020-09-01 DIAGNOSIS — C8111 Nodular sclerosis classical Hodgkin lymphoma, lymph nodes of head, face, and neck: Secondary | ICD-10-CM | POA: Diagnosis present

## 2020-09-01 DIAGNOSIS — Z7189 Other specified counseling: Secondary | ICD-10-CM

## 2020-09-01 DIAGNOSIS — D500 Iron deficiency anemia secondary to blood loss (chronic): Secondary | ICD-10-CM

## 2020-09-01 DIAGNOSIS — Z95828 Presence of other vascular implants and grafts: Secondary | ICD-10-CM

## 2020-09-01 LAB — CMP (CANCER CENTER ONLY)
ALT: 22 U/L (ref 0–44)
AST: 16 U/L (ref 15–41)
Albumin: 3.9 g/dL (ref 3.5–5.0)
Alkaline Phosphatase: 59 U/L (ref 38–126)
Anion gap: 9 (ref 5–15)
BUN: 9 mg/dL (ref 6–20)
CO2: 23 mmol/L (ref 22–32)
Calcium: 9.3 mg/dL (ref 8.9–10.3)
Chloride: 107 mmol/L (ref 98–111)
Creatinine: 0.81 mg/dL (ref 0.44–1.00)
GFR, Estimated: >60 mL/min (ref >60)
Glucose, Bld: 104 mg/dL — ABNORMAL HIGH (ref 70–99)
Potassium: 3.8 mmol/L (ref 3.5–5.1)
Sodium: 139 mmol/L (ref 135–145)
Total Bilirubin: 1.6 mg/dL — ABNORMAL HIGH (ref 0.3–1.2)
Total Protein: 7.6 g/dL (ref 6.5–8.1)

## 2020-09-01 LAB — CBC WITH DIFFERENTIAL/PLATELET
Abs Immature Granulocytes: 0.02 10*3/uL (ref 0.00–0.07)
Basophils Absolute: 0 10*3/uL (ref 0.0–0.1)
Basophils Relative: 0 %
Eosinophils Absolute: 0.1 10*3/uL (ref 0.0–0.5)
Eosinophils Relative: 1 %
HCT: 41.4 % (ref 36.0–46.0)
Hemoglobin: 14.4 g/dL (ref 12.0–15.0)
Immature Granulocytes: 0 %
Lymphocytes Relative: 34 %
Lymphs Abs: 3.1 10*3/uL (ref 0.7–4.0)
MCH: 33 pg (ref 26.0–34.0)
MCHC: 34.8 g/dL (ref 30.0–36.0)
MCV: 94.7 fL (ref 80.0–100.0)
Monocytes Absolute: 0.5 10*3/uL (ref 0.1–1.0)
Monocytes Relative: 5 %
Neutro Abs: 5.3 10*3/uL (ref 1.7–7.7)
Neutrophils Relative %: 60 %
Platelets: 248 10*3/uL (ref 150–400)
RBC: 4.37 MIL/uL (ref 3.87–5.11)
RDW: 12 % (ref 11.5–15.5)
WBC: 9 10*3/uL (ref 4.0–10.5)
nRBC: 0 % (ref 0.0–0.2)

## 2020-09-01 LAB — SEDIMENTATION RATE: Sed Rate: 15 mm/hr (ref 0–22)

## 2020-09-01 MED ORDER — SODIUM CHLORIDE 0.9% FLUSH
10.0000 mL | Freq: Once | INTRAVENOUS | Status: AC
  Administered 2020-09-01: 10 mL
  Filled 2020-09-01: qty 10

## 2020-09-01 MED ORDER — CITALOPRAM HYDROBROMIDE 10 MG PO TABS: 10.0000 mg | ORAL_TABLET | Freq: Every day | ORAL | 1 refills | Status: DC

## 2020-09-01 MED ORDER — HEPARIN SOD (PORK) LOCK FLUSH 100 UNIT/ML IV SOLN
500.0000 [IU] | Freq: Once | INTRAVENOUS | Status: AC
  Administered 2020-09-01: 500 [IU]
  Filled 2020-09-01: qty 5

## 2020-09-01 MED FILL — CITALOPRAM HBR 10 MG TABLET: 10 | 30 days supply | Qty: 30 | Fill #0

## 2020-09-01 NOTE — Progress Notes (Signed)
HEMATOLOGY/ONCOLOGY CLINIC NOTE  Date of Service: 09/01/20   Patient Care Team: [name], MD as PCP - General (Family Medicine)  CHIEF COMPLAINTS/PURPOSE OF CONSULTATION:   Continue management of classical hodgkins lymphoma   CURRENT TREATMENT: Active surveillance   HISTORY OF PRESENTING ILLNESS:   Elizabeth Beasley is a wonderful 39 y.o. female who has been referred to [place] by Dr Madilyn Fireman for evaluation and management of microcytic anemia/cervical lymphadenopathy.The pt reports that she has a history of iron deficiency but has never had a transfusion. Pt has had two pregnancies and has two children, ages 47 and 9. There was a concern for anemia with her second pregnancy so she was given an erythropoietin shot. She has had a copper IUD for about 2 years and her menstrual cycle has been slightly longer since getting the IUD. She has been taking 28 mg TID PO iron since June. Pt has no known medication allergies and has had a previous removal of Bartholin's gland cyst. Pt has had no known contact with chemicals either through work or hobbies. She has no history of asthma or heavy smoke/aerosol exposure.   Pt did not notice any cervical lymphadenopathy in June. In late July, early August she began to experience extreme fatigue and could begin to feel small lumps in her neck. Her fatigue is improved with taking her PO Iron TID. She has experienced a couple of episodes of night sweats, with the latest occurring in late August. Pt has also had <10 lbs loss in 2 months. She has not had any rashes but is experiencing itching in her palms, broader itching after hot showers.   Pt is currently working from home. She has had her seasonal flu shot. Pt is not planning on having more children and is not concerned about fertility. Pt's husband would like to take a drive to the [facility] next month. Pt would not want any blood product transfusions, as she is a Jehovah's Witness. She  is comfortable with IV iron infusions.   INTERVAL HISTORY:  Elizabeth Beasley is a 39 y.o. female here for evaluation and management of Hodgkins Lymphoma. The patient's last visit with [place] was on 06/09/2020. The pt reports that she is doing well overall.  The pt reports that she is experiencing some anxiety about her lymphoma diagnosis, virtual schooling for her children, and the pandemic. This anxiety is affecting her quality of sleep. She is considering speaking with a counselor about these feelings. Pt is currently on Augmentin as she was concerned about a new sinus infection. Pt is walking at least three times per week.  Of note since the patient's last visit, pt has had PET/CT ([id number]) completed on 08/25/2020 with results revealing "1. Clustered small hypermetabolic anterior mediastinal lymph nodes are not appreciably changed in size or metabolism. Mildly enlarged and mildly hypermetabolic right paratracheal lymph node is stable. Deauville category 4. 2. No new or progressive hypermetabolic disease. Spleen is normal in size and uptake. Previously visualized hypermetabolic left axillarylymph nodes have resolved and were probably reactive. 3. Small focus of hypermetabolism in the right uterine body without CT correlate, unchanged, probably a small uterine fibroid. New small focus of right adnexal hypermetabolism associated with a simple 1.5 cm right ovarian cyst, probably physiologic."  Lab results today (09/01/20) of CBC w/diff and CMP is as follows: all values are WNL except for Glucose at 104, Total Bilirubin at 1.6. 09/01/2020 Sed Rate at 15  On review of systems, pt  reports sleeplessness, anxiety and denies new lumps/bumps, shortness of breath, unexpected weight loss, fevers, chills, night sweats, low appetite, leg swelling, cough, itching, rash, constipation, diarrhea, abdominal pain and any other symptoms.   MEDICAL HISTORY:  Past Medical History:  Diagnosis Date  . Anemia   .  Bartholin gland cyst   . 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 Hx of seasonal allergies   . Lymphadenopathy, cervical 05/21/2019  . PONV (postoperative nausea and vomiting)   . 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 CESAREAN SECTION  2010  . MASS EXCISION Right 05/21/2019   Procedure: EXCISION OF RIGHT DEEP NECK LYMPH NODE;  Surgeon: Fanny Skates, MD;  Location: [place];  Service: General;  Laterality: Right;  . PORTACATH PLACEMENT Right 06/08/2019   Procedure: INSERTION PORT-A-CATH WITH ULTRASOUND GUIDANCE;  Surgeon: Fanny Skates, MD;  Location: [place];  Service: General;  Laterality: Right;  . URETHRAL CYST REMOVAL N/A 08/30/2016   Procedure: BARTHOLIN'S GLAND CYST EXCISION;  Surgeon: Ardis Hughs, MD;  Location: [facility];  Service: Urology;  Laterality: N/A;  . v back     2012    SOCIAL HISTORY: Social History   Socioeconomic History  . Marital status: Married    Spouse name: Montine Circle  . Number of children: 2  . Years of education: Assoc  . Highest education level: Not on file  Occupational History  . Occupation: CMA    Employer: [employer]  Tobacco Use  . Smoking status: Never Smoker  . Smokeless tobacco: Never Used  Vaping Use  . Vaping Use: Never used  Substance and Sexual Activity  . Alcohol use: Not Currently    Comment: ocassional  . Drug use: No  . Sexual activity: Yes    Partners: Male    Birth control/protection: I.U.D.  Other Topics Concern  . Not on file  Social History Narrative   Regular exercise. No regular caffeine.   Social Determinants of Health   Financial Resource Strain: Not on file  Food Insecurity: Not on file  Transportation Needs: Not on file  Physical Activity: Not on file  Stress: Not on file  Social Connections: Not on file  Intimate Partner Violence: Not on file     FAMILY HISTORY: Family History  Problem Relation Age of Onset  . Colon cancer Father 35  . Colon polyps Mother   . Hypertension Mother   . Diabetes Paternal Grandmother   . Stroke Paternal Grandmother   . Alcohol abuse Paternal Uncle   . Colon cancer Paternal Aunt 63  . Esophageal cancer Neg Hx   . Rectal cancer Neg Hx   . Stomach cancer Neg Hx    On PMHx the pt reports C-section, Bartholin's Gland Cyst removal. On Social Hx the pt reports non smoker, no alcohol use. On Family Hx the pt reports father passed from colon cancer, mother has colon polyps.  ALLERGIES:  has No Known Allergies.  MEDICATIONS:  Current Outpatient Medications  Medication Sig Dispense Refill  . amoxicillin-clavulanate (AUGMENTIN) 875-125 MG tablet Take 1 tablet by mouth 2 (two) times daily. 28 tablet 0  . b complex vitamins capsule Take 1 capsule by mouth daily.    . cholecalciferol (VITAMIN D3) 25 MCG (1000 UT) tablet Take 2,000 Units by mouth daily.    . citalopram (CELEXA) 10 MG tablet Take 1 tablet (10 mg  total) by mouth daily. 30 tablet 1  . fexofenadine (ALLEGRA) 180 MG tablet Take 180 mg by mouth daily as needed for allergies or rhinitis.     No current facility-administered medications for this visit.    REVIEW OF SYSTEMS:   A 10+ POINT REVIEW OF SYSTEMS WAS OBTAINED including neurology, dermatology, psychiatry, cardiac, respiratory, lymph, extremities, GI, GU, Musculoskeletal, constitutional, breasts, reproductive, HEENT.  All pertinent positives are noted in the HPI.  All others are negative.   PHYSICAL EXAMINATION: ECOG FS:1 - Symptomatic but completely ambulatory  Vitals:   09/01/20 0936  BP: 125/70  Pulse: (!) 109  Resp: 18  Temp: 98.3 F (36.8 C)  SpO2: 99%   Wt Readings from Last 3 Encounters:  09/01/20 222 lb 8 oz (100.9 kg)  06/09/20 229 lb (103.9 kg)  04/26/20 226 lb (102.5 kg)   Body mass index is 38.19 kg/m.    GENERAL:alert, in no acute distress and  comfortable SKIN: no acute rashes, no significant lesions EYES: conjunctiva are pink and non-injected, sclera anicteric OROPHARYNX: MMM, no exudates, no oropharyngeal erythema or ulceration NECK: supple, no JVD LYMPH:  no palpable lymphadenopathy in the cervical, axillary or inguinal regions LUNGS: clear to auscultation b/l with normal respiratory effort HEART: regular rate & rhythm ABDOMEN:  normoactive bowel sounds , non tender, not distended. No palpable hepatosplenomegaly.  Extremity: no pedal edema PSYCH: alert & oriented x 3 with fluent speech NEURO: no focal motor/sensory deficits  LABORATORY DATA:  I have reviewed the data as listed  - 05/14/2019 Fe+TIBC+Fer is as follows: Iron at 11, TIBC at 258, % Sat at 4, Ferritin at 50. -Discussed 05/21/2019 Lymph Node Bx ([id number]) which revealed " CLASSIC HODGKIN LYMPHOMA."  - 05/19/2019 CT C/A/P ([id number]) which revealed  "1. Enlarged mediastinal, bilateral hilar and lower cervical lymph nodes. Primary considerations include lymphoma, sarcoid and less likely metastatic disease. Normal spleen. No evidence of enlarged lymph nodes within the abdomen or pelvis. 2. Low lying IUD within the LOWER uterine segment and may extend to the cervix."  - 05/17/2019 CT Soft Tissue Neck w/contrast ([id number]) which revealed "Bulky right greater than left cervical and mediastinal lymphadenopathy concerning for lymphoma."  . CBC Latest Ref Rng & Units 09/01/2020 06/09/2020 03/31/2020  WBC 4.0 - 10.5 K/uL 9.0 8.8 8.0  Hemoglobin 12.0 - 15.0 g/dL 14.4 13.6 14.5  Hematocrit 36.0 - 46.0 % 41.4 39.1 42.1  Platelets 150 - 400 K/uL 248 274 230    . CMP Latest Ref Rng & Units 09/01/2020 06/09/2020 03/31/2020  Glucose 70 - 99 mg/dL 104(H) 79 84  BUN 6 - 20 mg/dL 9 10 11   Creatinine 0.44 - 1.00 mg/dL 0.81 0.70 0.74  Sodium 135 - 145 mmol/L 139 139 140  Potassium 3.5 - 5.1 mmol/L 3.8 4.0 3.9  Chloride 98 - 111 mmol/L 107 105 107  CO2 22 - 32 mmol/L 23 28 23    Calcium 8.9 - 10.3 mg/dL 9.3 9.0 9.2  Total Protein 6.5 - 8.1 g/dL 7.6 7.0 7.3  Total Bilirubin 0.3 - 1.2 mg/dL 1.6(H) 1.7(H) 1.1  Alkaline Phos 38 - 126 U/L 59 59 76  AST 15 - 41 U/L 16 15 14(L)  ALT 0 - 44 U/L 22 15 17    . Lab Results  Component Value Date   IRON 79 07/09/2019   TIBC 221 (L) 07/09/2019   IRONPCTSAT 36 07/09/2019   (Iron and TIBC)  Lab Results  Component Value Date   FERRITIN 590 (H) 07/09/2019  08/13/2019  NM PET Image Restag (PS) Skull Base To Thigh (Accession [id number])   05/21/2019 ([id number]) Lymph Node Biopsy    RADIOGRAPHIC STUDIES: I have personally reviewed the radiological images as listed and agreed with the findings in the report. NM PET Image Restag (PS) Skull Base To Thigh  Result Date: 08/25/2020 CLINICAL DATA:  Subsequent treatment strategy for Hodgkin's lymphoma. EXAM: NUCLEAR MEDICINE PET SKULL BASE TO THIGH TECHNIQUE: 10.9 mCi F-18 FDG was injected intravenously. Full-ring PET imaging was performed from the skull base to thigh after the radiotracer. CT data was obtained and used for attenuation correction and anatomic localization. Fasting blood glucose: 85 mg/dl COMPARISON:  04/04/2020 PET-CT. FINDINGS: Mediastinal blood pool activity: SUV max 3.1 Liver activity: SUV max 3.9 NECK: No hypermetabolic lymph nodes in the neck. Scattered foci of brown fat hypermetabolism in the neck bilaterally. Mildly enlarged right supraclavicular lymph nodes demonstrate no significant FDG uptake, largest 1.1 cm (series 4/image 47), stable size. Incidental CT findings: Mild mucoperiosteal thickening in the right greater than left maxillary sinuses without fluid levels. CHEST: Clustered small hypermetabolic anterior mediastinal lymph nodes spanning up to 5.7 x 2.6 cm with max SUV 5.1 (series 4/image 65), previously 5.4 x 2.7 cm with max SUV 4.8, not appreciably changed in size or metabolism. Mildly hypermetabolic enlarged short axis diameter 1.6 cm right paratracheal  lymph node with max SUV 3.3 (series 4/image 64), previously 1.7 cm using similar measurement technique with max SUV 2.8, not appreciably changed in size or metabolism. No new enlarged or hypermetabolic mediastinal or hilar lymph nodes. No enlarged or hypermetabolic axillary lymph nodes. Previously visualized hypermetabolic nonenlarged left axillary lymph nodes demonstrate no residual hypermetabolism. No hypermetabolic pulmonary findings. Incidental CT findings: Right subclavian Port-A-Cath terminates at the cavoatrial junction. No acute consolidative airspace disease, lung masses or significant pulmonary nodules. ABDOMEN/PELVIS: No abnormal hypermetabolic activity within the liver, pancreas, adrenal glands, or spleen. No hypermetabolic lymph nodes in the abdomen or pelvis. Small focus of hypermetabolism in the right uterine body without CT correlate, unchanged, probably a small uterine fibroid. IUD is grossly well-positioned in the uterine cavity. New right adnexal hypermetabolism associated with a 1.5 cm simple right ovarian cyst (series 4/image 170), probably physiologic. Incidental CT findings: Spleen is normal size. Simple 1.3 cm anterior left liver cyst. SKELETON: No focal hypermetabolic activity to suggest skeletal metastasis. Incidental CT findings: none IMPRESSION: 1. Clustered small hypermetabolic anterior mediastinal lymph nodes are not appreciably changed in size or metabolism. Mildly enlarged and mildly hypermetabolic right paratracheal lymph node is stable. Deauville category 4. 2. No new or progressive hypermetabolic disease. Spleen is normal in size and uptake. Previously visualized hypermetabolic left axillary lymph nodes have resolved and were probably reactive. 3. Small focus of hypermetabolism in the right uterine body without CT correlate, unchanged, probably a small uterine fibroid. New small focus of right adnexal hypermetabolism associated with a simple 1.5 cm right ovarian cyst, probably  physiologic. Pelvic ultrasound correlation may be obtained for these findings as clinically warranted. Electronically Signed   By: Ilona Sorrel M.D.   On: 08/25/2020 16:32    ASSESSMENT & PLAN:   1) Recently diagnosed Classical Hodgkins lymphoma Likely stage IIIB based on imaging thus far. 2) Microcytic Anemia due to anemia of chronic disease and Iron deficiency- hg improved to 13 3) Jehovah's Witness   PLAN: -Discussed pt labwork today, 09/01/20; blood counts and chemistries are nml, Sed Rate is WNL. -Discussed 08/25/2020 PET/CT ([id number]) which revealed "1. Clustered small hypermetabolic anterior mediastinal lymph nodes are  not appreciably changed in size or metabolism. Mildly enlarged and mildly hypermetabolic right paratracheal lymph node is stable. Deauville category 4. 2. No new or progressive hypermetabolic disease." -Advised pt that the lymph nodes in the mediastinum will be difficult to biopsy.  -Advised pt that a biopsy could tell us if activity observed on scan is from old tumor breakdown or from new [place] pt that a cardiothorastic surgeon could get a biopsy via mediastinoscopy or IR may be able to complete a needle biopsy.  -Discussed suicidal ideations associated with using Celexa. Recommend pt contact immediately if she begins to experience these thoughts.  -Advised pt that Celexa can cause nausea for the first 2-3 days and may take up to two weeks for peak effect.  -Recommended that the pt continue to eat well, drink at least 48-64 oz of water each day, and walk 20-30 minutes each day.  -Will refer pt to Cardiothoracic Surgery for mediastinoscopic biopsy.  -Will see back in 2 months with labs  -Rx Celexa   FOLLOW UP: -Referral to cardiothoracic surgery for possible mediastinoscopic biopsy of FDG avid lymph nodes -RTC with Dr Irene Limbo with labs in 2 months   The total time spent in the appt was 30 minutes and more than 50% was on counseling and direct patient  cares.  All of the patient's questions were answered with apparent satisfaction. The patient knows to call the clinic with any problems, questions or concerns.   Sullivan Lone MD [place] AAHIVMS [place] [facility] Hematology/Oncology Physician [facility]  (Office):       [phone] (Work cell):  [phone] (Fax):           [phone]  09/01/2020 11:12 AM  I, Yevette Edwards, am acting as a scribe for Dr. Sullivan Lone.   .I have reviewed the above documentation for accuracy and completeness, and I agree with the above. Brunetta Genera MD

## 2020-09-14 ENCOUNTER — Telehealth: Payer: Self-pay | Admitting: *Deleted

## 2020-09-14 NOTE — Telephone Encounter (Signed)
Patient called with information for Dr. Candise Che: She stopped Citalopram after one week. Did not like the way she felt. She will revisit taking it if needed once IUD is removed She is having her IUD removed 1/12. She thinks it may be cause of some of emotional symptoms. Appointment with Cardiovascular Surgeon on 1/4 - consultation prior to biopsy She will update at her appt here in February

## 2020-09-19 ENCOUNTER — Other Ambulatory Visit: Payer: Self-pay

## 2020-09-19 ENCOUNTER — Encounter: Payer: Self-pay | Admitting: Thoracic Surgery (Cardiothoracic Vascular Surgery)

## 2020-09-19 ENCOUNTER — Institutional Professional Consult (permissible substitution): Payer: 59 | Admitting: Thoracic Surgery (Cardiothoracic Vascular Surgery)

## 2020-09-19 VITALS — BP 111/72 | HR 90 | Resp 20 | Ht 64.0 in | Wt 222.0 lb

## 2020-09-19 DIAGNOSIS — R911 Solitary pulmonary nodule: Secondary | ICD-10-CM

## 2020-09-19 NOTE — H&P (View-Only) (Signed)
PCP is Agapito Games, MD Referring Provider is Johney Maine, MD  Chief Complaint  Patient presents with   Lung Lesion    Surgical consult, PET Scan 08/25/20, Chest CT 05/18/20    HPI: Mrs. Pulcini is sent for consultation for mediastinal lymphadenopathy  Breya Cass is a 40 year old woman with a history of stage III nodular sclerosing Hodgkin's lymphoma, anemia, and urethral diverticulum.  She presented initially with anemia, fatigue and then noted cervical lymphadenopathy.  Biopsy showed nodular sclerosing Hodgkin's disease.  She was treated for that.  She recently saw Dr. Candise Che and had a repeat PET/CT.  There were some anterior mediastinal nodes that were similar size and metabolic activity to her previous PET/CT.  There also was a right paratracheal node but it was less active than the anterior mediastinal nodes.  Currently she is feeling well.  She has lost 13 pounds with weight watchers.  Her appetite is good.  She is not having any fevers, chills, or night sweats.  She denies fatigue.  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 Past Medical History:  Diagnosis Date   Anemia    Bartholin gland cyst    Hodgkin's lymphoma, nodular sclerosis (HCC) 06/08/2019   Hx of seasonal allergies    Lymphadenopathy, cervical 05/21/2019   PONV (postoperative nausea and vomiting)    Urethral diverticulum 04/05/2011   Pt is scheduled for surgery       Past Surgical History:  Procedure Laterality Date   CESAREAN SECTION  2010   MASS EXCISION Right 05/21/2019   Procedure: EXCISION OF RIGHT DEEP NECK LYMPH NODE;  Surgeon: Claud Kelp, MD;  Location: [facility];  Service: General;  Laterality: Right;   PORTACATH PLACEMENT Right 06/08/2019   Procedure: INSERTION PORT-A-CATH WITH ULTRASOUND GUIDANCE;  Surgeon: Claud Kelp, MD;  Location: [facility];  Service: General;  Laterality: Right;   URETHRAL CYST REMOVAL N/A 08/30/2016    Procedure: BARTHOLIN'S GLAND CYST EXCISION;  Surgeon: Crist Fat, MD;  Location: [facility];  Service: Urology;  Laterality: N/A;   v back     2012    Family History  Problem Relation Age of Onset   Colon cancer Father 88   Colon polyps Mother    Hypertension Mother    Diabetes Paternal Grandmother    Stroke Paternal Grandmother    Alcohol abuse Paternal Uncle    Colon cancer Paternal Aunt 67   Esophageal cancer Neg Hx    Rectal cancer Neg Hx    Stomach cancer Neg Hx     Social History Social History   Tobacco Use   Smoking status: Never Smoker   Smokeless tobacco: Never Used  Building services engineer Use: Never used  Substance Use Topics   Alcohol use: Not Currently    Comment: ocassional   Drug use: No    Current Outpatient Medications  Medication Sig Dispense Refill   b complex vitamins capsule Take 1 capsule by mouth daily.     cholecalciferol (VITAMIN D3) 25 MCG (1000 UT) tablet Take 2,000 Units by mouth daily.     fexofenadine (ALLEGRA) 180 MG tablet Take 180 mg by mouth daily as needed for allergies or rhinitis.     citalopram (CELEXA) 10 MG tablet Take 1 tablet (10 mg total) by mouth daily. 30 tablet 1   No current facility-administered medications for this visit.    No Known Allergies  Review of Systems  Constitutional: Negative for  activity change, fatigue, fever and unexpected weight change.  HENT: Negative.   Eyes: Negative for visual disturbance.  Respiratory: Negative.   Cardiovascular: Negative.   Gastrointestinal: Negative.   Psychiatric/Behavioral: The patient is nervous/anxious.     BP 111/72    Pulse 90    Resp 20    Ht 5\' 4"  (1.626 m)    Wt 222 lb (100.7 kg)    SpO2 98% Comment: RA   BMI 38.11 kg/m  Physical Exam Vitals reviewed.  Constitutional:      General: She is not in acute distress. HENT:     Head: Normocephalic and atraumatic.  Eyes:     General: No scleral icterus.    Extraocular  Movements: Extraocular movements intact.  Cardiovascular:     Rate and Rhythm: Normal rate and regular rhythm.     Heart sounds: Normal heart sounds. No murmur heard. No friction rub. No gallop.   Pulmonary:     Effort: Pulmonary effort is normal. No respiratory distress.     Breath sounds: No wheezing or rales.  Abdominal:     General: There is no distension.     Palpations: Abdomen is soft.  Musculoskeletal:     Cervical back: Neck supple.  Lymphadenopathy:     Cervical: No cervical adenopathy.  Skin:    General: Skin is warm and dry.  Neurological:     General: No focal deficit present.     Mental Status: She is alert and oriented to person, place, and time.     Cranial Nerves: No cranial nerve deficit.     Motor: No weakness.    Diagnostic Tests: NUCLEAR MEDICINE PET SKULL BASE TO THIGH  TECHNIQUE: 10.9 mCi F-18 FDG was injected intravenously. Full-ring PET imaging was performed from the skull base to thigh after the radiotracer. CT data was obtained and used for attenuation correction and anatomic localization.  Fasting blood glucose: 85 mg/dl  COMPARISON:  04/04/2020 PET-CT.  FINDINGS: Mediastinal blood pool activity: SUV max 3.1  Liver activity: SUV max 3.9  NECK: No hypermetabolic lymph nodes in the neck. Scattered foci of brown fat hypermetabolism in the neck bilaterally. Mildly enlarged right supraclavicular lymph nodes demonstrate no significant FDG uptake, largest 1.1 cm (series 4/image 47), stable size.  Incidental CT findings: Mild mucoperiosteal thickening in the right greater than left maxillary sinuses without fluid levels.  CHEST:  Clustered small hypermetabolic anterior mediastinal lymph nodes spanning up to 5.7 x 2.6 cm with max SUV 5.1 (series 4/image 65), previously 5.4 x 2.7 cm with max SUV 4.8, not appreciably changed in size or metabolism.  Mildly hypermetabolic enlarged short axis diameter 1.6 cm right paratracheal lymph  node with max SUV 3.3 (series 4/image 64), previously 1.7 cm using similar measurement technique with max SUV 2.8, not appreciably changed in size or metabolism.  No new enlarged or hypermetabolic mediastinal or hilar lymph nodes. No enlarged or hypermetabolic axillary lymph nodes. Previously visualized hypermetabolic nonenlarged left axillary lymph nodes demonstrate no residual hypermetabolism. No hypermetabolic pulmonary findings.  Incidental CT findings: Right subclavian Port-A-Cath terminates at the cavoatrial junction. No acute consolidative airspace disease, lung masses or significant pulmonary nodules.  ABDOMEN/PELVIS: No abnormal hypermetabolic activity within the liver, pancreas, adrenal glands, or spleen. No hypermetabolic lymph nodes in the abdomen or pelvis.  Small focus of hypermetabolism in the right uterine body without CT correlate, unchanged, probably a small uterine fibroid. IUD is grossly well-positioned in the uterine cavity.  New right adnexal hypermetabolism associated with a  1.5 cm simple right ovarian cyst (series 4/image 170), probably physiologic.  Incidental CT findings: Spleen is normal size. Simple 1.3 cm anterior left liver cyst.  SKELETON: No focal hypermetabolic activity to suggest skeletal metastasis.  Incidental CT findings: none  IMPRESSION: 1. Clustered small hypermetabolic anterior mediastinal lymph nodes are not appreciably changed in size or metabolism. Mildly enlarged and mildly hypermetabolic right paratracheal lymph node is stable. Deauville category 4. 2. No new or progressive hypermetabolic disease. Spleen is normal in size and uptake. Previously visualized hypermetabolic left axillary lymph nodes have resolved and were probably reactive. 3. Small focus of hypermetabolism in the right uterine body without CT correlate, unchanged, probably a small uterine fibroid. New small focus of right adnexal hypermetabolism associated  with a simple 1.5 cm right ovarian cyst, probably physiologic. Pelvic ultrasound correlation may be obtained for these findings as clinically warranted.   Electronically Signed   By: Ilona Sorrel M.D.   On: 08/25/2020 16:32 I personally reviewed the PET CT images.  There is a cluster of anterior mediastinal nodes with some metabolic activity.  There is a very faintly hypermetabolic right paratracheal node.  Impression: Elizabeth Beasley is a 40 year old Jehovah's Witness with stage III Hodgkin's lymphoma.  She was treated with chemotherapy.  Her most recent PET shows stable mediastinal adenopathy with some mild metabolic activity.  There are 2 areas of concern 1 in the right paratracheal region and the other in the anterior mediastinum.  Of these there is more activity in the anterior mediastinum.  There are 2 potential approaches for biopsy.  The first would be a Chamberlain procedure or anterior mediastinotomy.  Second option would be to do a robotic left VATS for biopsy of the anterior mediastinal nodes.  Of those I would favor the robotic approach for better access.  I discussed the possible approaches for surgical biopsy with Mrs. Colvin.  She understands that both would be surgical procedures requiring general anesthesia.  I informed her of the general nature of the procedure including the incisions to be used, the use of drains to postoperatively, the expected hospital stay (overnight, and the overall recovery.  I informed her of the indications, risks, benefits, and alternatives.  She understands the risks include, but are not limited to death, MI, DVT, PE, bleeding, infection, air leak, as well as the possibility of other unforeseeable complications.  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  She wishes to think over her options and discuss with her husband before making decision.  I encouraged her to further discuss with Dr. Irene Limbo and I would be happy to answer  any questions.  Plan: Patient will discuss with her husband and possibly Dr. Jenell Milliner.  She will let us know if she would like to proceed with robotic left VATS for biopsy of anterior mediastinal lymph nodes  [name], MD [facility] [phone]

## 2020-09-19 NOTE — Progress Notes (Signed)
PCP is [name], MD Referring Provider is Brunetta Genera, MD  Chief Complaint  Patient presents with  . Lung Lesion    Surgical consult, PET Scan 08/25/20, Chest CT 05/18/20    HPI: Elizabeth Beasley is sent for consultation for mediastinal lymphadenopathy  Elizabeth Beasley is a 40 year old woman with a history of stage III nodular sclerosing Hodgkin's lymphoma, anemia, and urethral diverticulum.  She presented initially with anemia, fatigue and then noted cervical lymphadenopathy.  Biopsy showed nodular sclerosing Hodgkin's disease.  She was treated for that.  She recently saw Dr. Irene Limbo and had a repeat PET/CT.  There were some anterior mediastinal nodes that were similar size and metabolic activity to her previous PET/CT.  There also was a right paratracheal node but it was less active than the anterior mediastinal nodes.  Currently she is feeling well.  She has lost 13 pounds with weight watchers.  Her appetite is good.  She is not having any fevers, chills, or night sweats.  She denies fatigue.  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 Past Medical History:  Diagnosis Date  . Anemia   . Bartholin gland cyst   . Hodgkin's lymphoma, nodular sclerosis ([place]) 06/08/2019  . Hx of seasonal allergies   . Lymphadenopathy, cervical 05/21/2019  . PONV (postoperative nausea and vomiting)   . 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 CESAREAN SECTION  2010  . MASS EXCISION Right 05/21/2019   Procedure: EXCISION OF RIGHT DEEP NECK LYMPH NODE;  Surgeon: Fanny Skates, MD;  Location: [place];  Service: General;  Laterality: Right;  . PORTACATH PLACEMENT Right 06/08/2019   Procedure: INSERTION PORT-A-CATH WITH ULTRASOUND GUIDANCE;  Surgeon: Fanny Skates, MD;  Location: [facility];  Service: General;  Laterality: Right;  . URETHRAL CYST REMOVAL N/A 08/30/2016    Procedure: BARTHOLIN'S GLAND CYST EXCISION;  Surgeon: Ardis Hughs, MD;  Location: [facility];  Service: Urology;  Laterality: N/A;  . v back     2012    Family History  Problem Relation Age of Onset  . Colon cancer Father 19  . Colon polyps Mother   . Hypertension Mother   . Diabetes Paternal Grandmother   . Stroke Paternal Grandmother   . Alcohol abuse Paternal Uncle   . Colon cancer Paternal Aunt 53  . Esophageal cancer Neg Hx   . Rectal cancer Neg Hx   . Stomach cancer Neg Hx     Social History Social History   Tobacco Use  . Smoking status: Never Smoker  . Smokeless tobacco: Never Used  Vaping Use  . Vaping Use: Never used  Substance Use Topics  . Alcohol use: Not Currently    Comment: ocassional  . Drug use: No    Current Outpatient Medications  Medication Sig Dispense Refill  . b complex vitamins capsule Take 1 capsule by mouth daily.    . cholecalciferol (VITAMIN D3) 25 MCG (1000 UT) tablet Take 2,000 Units by mouth daily.    . fexofenadine (ALLEGRA) 180 MG tablet Take 180 mg by mouth daily as needed for allergies or rhinitis.    . citalopram (CELEXA) 10 MG tablet Take 1 tablet (10 mg total) by mouth daily. 30 tablet 1   No current facility-administered medications for this visit.    No Known Allergies  Review of Systems  Constitutional: Negative for  activity change, fatigue, fever and unexpected weight change.  HENT: Negative.   Eyes: Negative for visual disturbance.  Respiratory: Negative.   Cardiovascular: Negative.   Gastrointestinal: Negative.   Psychiatric/Behavioral: The patient is nervous/anxious.     BP 111/72   Pulse 90   Resp 20   Ht 5\' 4"  (1.626 m)   Wt 222 lb (100.7 kg)   SpO2 98% Comment: RA  BMI 38.11 kg/m  Physical Exam Vitals reviewed.  Constitutional:      General: She is not in acute distress. HENT:     Head: Normocephalic and atraumatic.  Eyes:     General: No scleral icterus.    Extraocular  Movements: Extraocular movements intact.  Cardiovascular:     Rate and Rhythm: Normal rate and regular rhythm.     Heart sounds: Normal heart sounds. No murmur heard. No friction rub. No gallop.   Pulmonary:     Effort: Pulmonary effort is normal. No respiratory distress.     Breath sounds: No wheezing or rales.  Abdominal:     General: There is no distension.     Palpations: Abdomen is soft.  Musculoskeletal:     Cervical back: Neck supple.  Lymphadenopathy:     Cervical: No cervical adenopathy.  Skin:    General: Skin is warm and dry.  Neurological:     General: No focal deficit present.     Mental Status: She is alert and oriented to person, place, and time.     Cranial Nerves: No cranial nerve deficit.     Motor: No weakness.    Diagnostic Tests: NUCLEAR MEDICINE PET SKULL BASE TO THIGH  TECHNIQUE: 10.9 mCi F-18 FDG was injected intravenously. Full-ring PET imaging was performed from the skull base to thigh after the radiotracer. CT data was obtained and used for attenuation correction and anatomic localization.  Fasting blood glucose: 85 mg/dl  COMPARISON:  04/04/2020 PET-CT.  FINDINGS: Mediastinal blood pool activity: SUV max 3.1  Liver activity: SUV max 3.9  NECK: No hypermetabolic lymph nodes in the neck. Scattered foci of brown fat hypermetabolism in the neck bilaterally. Mildly enlarged right supraclavicular lymph nodes demonstrate no significant FDG uptake, largest 1.1 cm (series 4/image 47), stable size.  Incidental CT findings: Mild mucoperiosteal thickening in the right greater than left maxillary sinuses without fluid levels.  CHEST:  Clustered small hypermetabolic anterior mediastinal lymph nodes spanning up to 5.7 x 2.6 cm with max SUV 5.1 (series 4/image 65), previously 5.4 x 2.7 cm with max SUV 4.8, not appreciably changed in size or metabolism.  Mildly hypermetabolic enlarged short axis diameter 1.6 cm right paratracheal lymph  node with max SUV 3.3 (series 4/image 64), previously 1.7 cm using similar measurement technique with max SUV 2.8, not appreciably changed in size or metabolism.  No new enlarged or hypermetabolic mediastinal or hilar lymph nodes. No enlarged or hypermetabolic axillary lymph nodes. Previously visualized hypermetabolic nonenlarged left axillary lymph nodes demonstrate no residual hypermetabolism. No hypermetabolic pulmonary findings.  Incidental CT findings: Right subclavian Port-A-Cath terminates at the cavoatrial junction. No acute consolidative airspace disease, lung masses or significant pulmonary nodules.  ABDOMEN/PELVIS: No abnormal hypermetabolic activity within the liver, pancreas, adrenal glands, or spleen. No hypermetabolic lymph nodes in the abdomen or pelvis.  Small focus of hypermetabolism in the right uterine body without CT correlate, unchanged, probably a small uterine fibroid. IUD is grossly well-positioned in the uterine cavity.  New right adnexal hypermetabolism associated with a 1.5 cm simple right ovarian cyst (  series 4/image 170), probably physiologic.  Incidental CT findings: Spleen is normal size. Simple 1.3 cm anterior left liver cyst.  SKELETON: No focal hypermetabolic activity to suggest skeletal metastasis.  Incidental CT findings: none  IMPRESSION: 1. Clustered small hypermetabolic anterior mediastinal lymph nodes are not appreciably changed in size or metabolism. Mildly enlarged and mildly hypermetabolic right paratracheal lymph node is stable. Deauville category 4. 2. No new or progressive hypermetabolic disease. Spleen is normal in size and uptake. Previously visualized hypermetabolic left axillary lymph nodes have resolved and were probably reactive. 3. Small focus of hypermetabolism in the right uterine body without CT correlate, unchanged, probably a small uterine fibroid. New small focus of right adnexal hypermetabolism associated  with a simple 1.5 cm right ovarian cyst, probably physiologic. Pelvic ultrasound correlation may be obtained for these findings as clinically warranted.   Electronically Signed   By: Ilona Sorrel M.D.   On: 08/25/2020 16:32 I personally reviewed the PET CT images.  There is a cluster of anterior mediastinal nodes with some metabolic activity.  There is a very faintly hypermetabolic right paratracheal node.  Impression: Elizabeth Beasley is a 40 year old Jehovah's Witness with stage III Hodgkin's lymphoma.  She was treated with chemotherapy.  Her most recent PET shows stable mediastinal adenopathy with some mild metabolic activity.  There are 2 areas of concern 1 in the right paratracheal region and the other in the anterior mediastinum.  Of these there is more activity in the anterior mediastinum.  There are 2 potential approaches for biopsy.  The first would be a Chamberlain procedure or anterior mediastinotomy.  Second option would be to do a robotic left VATS for biopsy of the anterior mediastinal nodes.  Of those I would favor the robotic approach for better access.  I discussed the possible approaches for surgical biopsy with Mrs. Kelemen.  She understands that both would be surgical procedures requiring general anesthesia.  I informed her of the general nature of the procedure including the incisions to be used, the use of drains to postoperatively, the expected hospital stay (overnight, and the overall recovery.  I informed her of the indications, risks, benefits, and alternatives.  She understands the risks include, but are not limited to death, MI, DVT, PE, bleeding, infection, air leak, as well as the possibility of other unforeseeable complications.  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  She wishes to think over her options and discuss with her husband before making decision.  I encouraged her to further discuss with Dr. Irene Limbo and I would be happy to answer  any questions.  Plan: Patient will discuss with her husband and possibly Dr. Jenell Milliner.  She will let us know if she would like to proceed with robotic left VATS for biopsy of anterior mediastinal lymph nodes  [name], MD [facility] [phone]

## 2020-09-22 ENCOUNTER — Encounter: Payer: Self-pay | Admitting: *Deleted

## 2020-09-22 ENCOUNTER — Other Ambulatory Visit: Payer: Self-pay | Admitting: *Deleted

## 2020-09-22 DIAGNOSIS — R590 Localized enlarged lymph nodes: Secondary | ICD-10-CM

## 2020-09-27 ENCOUNTER — Other Ambulatory Visit: Payer: Self-pay

## 2020-09-27 ENCOUNTER — Encounter: Payer: Self-pay | Admitting: Family Medicine

## 2020-09-27 ENCOUNTER — Ambulatory Visit (INDEPENDENT_AMBULATORY_CARE_PROVIDER_SITE_OTHER): Payer: 59 | Admitting: Family Medicine

## 2020-09-27 VITALS — BP 119/76 | HR 92 | Wt 219.6 lb

## 2020-09-27 DIAGNOSIS — T8332XA Displacement of intrauterine contraceptive device, initial encounter: Secondary | ICD-10-CM

## 2020-09-27 DIAGNOSIS — Z30432 Encounter for removal of intrauterine contraceptive device: Secondary | ICD-10-CM

## 2020-09-27 DIAGNOSIS — N6459 Other signs and symptoms in breast: Secondary | ICD-10-CM | POA: Diagnosis not present

## 2020-09-27 NOTE — Progress Notes (Signed)
   GYNECOLOGY PROBLEM  VISIT ENCOUNTER NOTE  Subjective:   Elizabeth Beasley is a 40 y.o. G73P2002 female here for a  problem GYN visit.  Current complaints: desires IUD removal. Reports a patch on right areola that was dry/itchy, went away on its own.     Denies abnormal vaginal bleeding, discharge, pelvic pain, problems with intercourse or other gynecologic concerns.    Gynecologic History No LMP recorded. (Menstrual status: IUD). Contraception: IUD  Health Maintenance Due  Topic Date Due  . Hepatitis C Screening  Never done     The following portions of the patient's history were reviewed and updated as appropriate: allergies, current medications, past family history, past medical history, past social history, past surgical history and problem list.  Review of Systems Pertinent items are noted in HPI.   Objective:  BP 119/76   Pulse 92   Wt 219 lb 9.6 oz (99.6 kg)   BMI 37.69 kg/m  Gen: well appearing, NAD HEENT: no scleral icterus CV: RR Lung: Normal WOB Ext: warm well perfused  PELVIC: Normal appearing external genitalia; normal appearing vaginal mucosa and cervix.  No abnormal discharge noted.   Normal uterine size, no other palpable masses, no uterine or adnexal tenderness.   IUD Removal  Patient identified, informed consent performed, consent signed.  Patient was in the dorsal lithotomy position, normal external genitalia was noted.  A speculum was placed in the patient's vagina, normal discharge was noted, no lesions. The cervix was visualized, no lesions, no abnormal discharge.  The strings of the IUD were not seen at os. Attempted ot use cytobrush, uterine forceps and IUD hook to get strings/device. Unable to do so.    Assessment and Plan:  1. Intrauterine contraceptive device threads lost, initial encounter Failed IUD removal, strings lost - US PELVIS (TRANSABDOMINAL ONLY); Future  2. Breast complaint - no changes on exam Likely eczema or psoriasis given  description. Counseled to use eucerin or hydrocortisone.  Please refer to After Visit Summary for other counseling recommendations.   Return after [place] for IUD removal.  Caren Macadam, MD, MPH, ABFM Attending [place] for [facility]

## 2020-09-27 NOTE — Progress Notes (Signed)
IUD placed in 2020 Pt requesting breast exam

## 2020-10-03 NOTE — Progress Notes (Signed)
[place], [place] - [place] [address] Phone: [phone] Fax: [phone]  [place], [place] - [address] [place] [address] Phone: [phone] Fax: [phone]      Your procedure is scheduled on Friday January 21st .  Report to [facility] Main Entrance "A" at 5:30 A.M., and check in at the Admitting office.  Call this number if you have problems the morning of surgery:  [phone]  Call [phone] if you have any questions prior to your surgery date Monday-Friday 8am-4pm    Remember:  Do not eat or drink anything after midnight the night before your surgery   Take these medicines the morning of surgery with A SIP OF WATER   loratadine (CLARITIN) 10 MG tablet   IF needed  fluticasone (FLONASE) 50 MCG/ACT nasal spray  Polyvinyl Alcohol-Povidone (REFRESH OP)   As of today, STOP taking any Aspirin (unless otherwise instructed by your surgeon) Aleve, Naproxen, Ibuprofen, Motrin, Advil, Goody's, BC's, all herbal medications, fish oil, and all vitamins.                     Do not wear jewelry, make up, or nail polish            Do not wear lotions, powders, perfumes, or deodorant.            Do not shave 48 hours prior to surgery.              Do not bring valuables to the hospital.            [facility] is not responsible for any belongings or valuables.  Do NOT Smoke (Tobacco/Vaping) or drink Alcohol 24 hours prior to your procedure If you use a CPAP at night, you may bring all equipment for your overnight stay.   Contacts, glasses, dentures or bridgework may not be worn into surgery.      For patients admitted to the hospital, discharge time will be determined by your treatment team.   Patients discharged the day of surgery will not be allowed to drive home, and someone needs to stay with them for 24  hours.    Special instructions:   [facility]- Preparing For Surgery  Before surgery, you can play an important role. Because skin is not sterile, your skin needs to be as free of germs as possible. You can reduce the number of germs on your skin by washing with CHG (chlorahexidine gluconate) Soap before surgery.  CHG is an antiseptic cleaner which kills germs and bonds with the skin to continue killing germs even after washing.    Oral Hygiene is also important to reduce your risk of infection.  Remember - BRUSH YOUR TEETH THE MORNING OF SURGERY WITH YOUR REGULAR TOOTHPASTE  Please do not use if you have an allergy to CHG or antibacterial soaps. If your skin becomes reddened/irritated stop using the CHG.  Do not shave (including legs and underarms) for at least 48 hours prior to first CHG shower. It is OK to shave your face.  Please follow these instructions carefully.   1. Shower the NIGHT BEFORE SURGERY and the MORNING OF SURGERY with CHG Soap.   2. If you chose to wash your hair, wash your hair first as usual with your normal shampoo.  3. After you shampoo, rinse your hair and body thoroughly to remove the  shampoo.  4. Use CHG as you would any other liquid soap. You can apply CHG directly to the skin and wash gently with a scrungie or a clean washcloth.   5. Apply the CHG Soap to your body ONLY FROM THE NECK DOWN.  Do not use on open wounds or open sores. Avoid contact with your eyes, ears, mouth and genitals (private parts). Wash Face and genitals (private parts)  with your normal soap.   6. Wash thoroughly, paying special attention to the area where your surgery will be performed.  7. Thoroughly rinse your body with warm water from the neck down.  8. DO NOT shower/wash with your normal soap after using and rinsing off the CHG Soap.  9. Pat yourself dry with a CLEAN TOWEL.  10. Wear CLEAN PAJAMAS to bed the night before surgery  11. Place CLEAN SHEETS on your bed the night of  your first shower and DO NOT SLEEP WITH PETS.   Day of Surgery: Wear Clean/Comfortable clothing the morning of surgery Do not apply any deodorants/lotions.   Remember to brush your teeth WITH YOUR REGULAR TOOTHPASTE.   Please read over the following fact sheets that you were given.

## 2020-10-04 ENCOUNTER — Other Ambulatory Visit: Payer: Self-pay

## 2020-10-04 ENCOUNTER — Encounter (HOSPITAL_COMMUNITY): Payer: Self-pay

## 2020-10-04 ENCOUNTER — Other Ambulatory Visit (HOSPITAL_COMMUNITY)
Admission: RE | Admit: 2020-10-04 | Discharge: 2020-10-04 | Disposition: A | Discharge status: Home / Self Care | Payer: 59 | Source: Ambulatory Visit | Attending: Thoracic Surgery (Cardiothoracic Vascular Surgery) | Admitting: Thoracic Surgery (Cardiothoracic Vascular Surgery)

## 2020-10-04 ENCOUNTER — Encounter (HOSPITAL_COMMUNITY)
Admission: RE | Admit: 2020-10-04 | Discharge: 2020-10-04 | Disposition: A | Discharge status: Home / Self Care | Payer: 59 | Source: Ambulatory Visit | Attending: Thoracic Surgery (Cardiothoracic Vascular Surgery) | Admitting: Thoracic Surgery (Cardiothoracic Vascular Surgery)

## 2020-10-04 ENCOUNTER — Ambulatory Visit (HOSPITAL_COMMUNITY)
Admission: RE | Admit: 2020-10-04 | Discharge: 2020-10-04 | Disposition: A | Discharge status: Home / Self Care | Payer: 59 | Source: Ambulatory Visit | Attending: Thoracic Surgery (Cardiothoracic Vascular Surgery) | Admitting: Thoracic Surgery (Cardiothoracic Vascular Surgery)

## 2020-10-04 DIAGNOSIS — J310 Chronic rhinitis: Secondary | ICD-10-CM | POA: Diagnosis not present

## 2020-10-04 DIAGNOSIS — D649 Anemia, unspecified: Secondary | ICD-10-CM | POA: Diagnosis not present

## 2020-10-04 DIAGNOSIS — D259 Leiomyoma of uterus, unspecified: Secondary | ICD-10-CM | POA: Diagnosis present

## 2020-10-04 DIAGNOSIS — N83291 Other ovarian cyst, right side: Secondary | ICD-10-CM | POA: Diagnosis present

## 2020-10-04 DIAGNOSIS — I10 Essential (primary) hypertension: Secondary | ICD-10-CM | POA: Insufficient documentation

## 2020-10-04 DIAGNOSIS — I898 Other specified noninfective disorders of lymphatic vessels and lymph nodes: Secondary | ICD-10-CM | POA: Diagnosis not present

## 2020-10-04 DIAGNOSIS — Z01812 Encounter for preprocedural laboratory examination: Secondary | ICD-10-CM | POA: Insufficient documentation

## 2020-10-04 DIAGNOSIS — Z8571 Personal history of Hodgkin lymphoma: Secondary | ICD-10-CM | POA: Diagnosis not present

## 2020-10-04 DIAGNOSIS — R918 Other nonspecific abnormal finding of lung field: Secondary | ICD-10-CM | POA: Diagnosis not present

## 2020-10-04 DIAGNOSIS — J302 Other seasonal allergic rhinitis: Secondary | ICD-10-CM | POA: Diagnosis not present

## 2020-10-04 DIAGNOSIS — N361 Urethral diverticulum: Secondary | ICD-10-CM | POA: Diagnosis present

## 2020-10-04 DIAGNOSIS — R590 Localized enlarged lymph nodes: Secondary | ICD-10-CM | POA: Insufficient documentation

## 2020-10-04 DIAGNOSIS — Z79899 Other long term (current) drug therapy: Secondary | ICD-10-CM | POA: Diagnosis not present

## 2020-10-04 DIAGNOSIS — E669 Obesity, unspecified: Secondary | ICD-10-CM | POA: Diagnosis not present

## 2020-10-04 DIAGNOSIS — D500 Iron deficiency anemia secondary to blood loss (chronic): Secondary | ICD-10-CM | POA: Diagnosis not present

## 2020-10-04 DIAGNOSIS — K7689 Other specified diseases of liver: Secondary | ICD-10-CM | POA: Diagnosis present

## 2020-10-04 DIAGNOSIS — Z20822 Contact with and (suspected) exposure to covid-19: Secondary | ICD-10-CM | POA: Insufficient documentation

## 2020-10-04 DIAGNOSIS — Z9221 Personal history of antineoplastic chemotherapy: Secondary | ICD-10-CM | POA: Diagnosis not present

## 2020-10-04 DIAGNOSIS — J9811 Atelectasis: Secondary | ICD-10-CM | POA: Diagnosis not present

## 2020-10-04 DIAGNOSIS — Z01818 Encounter for other preprocedural examination: Secondary | ICD-10-CM

## 2020-10-04 DIAGNOSIS — R238 Other skin changes: Secondary | ICD-10-CM | POA: Diagnosis not present

## 2020-10-04 DIAGNOSIS — Z6838 Body mass index (BMI) 38.0-38.9, adult: Secondary | ICD-10-CM | POA: Diagnosis not present

## 2020-10-04 DIAGNOSIS — C8110 Nodular sclerosis classical Hodgkin lymphoma, unspecified site: Secondary | ICD-10-CM | POA: Diagnosis not present

## 2020-10-04 HISTORY — DX: Hodgkin lymphoma, unspecified, unspecified site: C81.90

## 2020-10-04 HISTORY — DX: Headache, unspecified: R51.9

## 2020-10-04 LAB — PROTIME-INR
INR: 1 (ref 0.8–1.2)
Prothrombin Time: 12.8 seconds (ref 11.4–15.2)

## 2020-10-04 LAB — URINALYSIS, ROUTINE W REFLEX MICROSCOPIC
Bilirubin Urine: NEGATIVE
Glucose, UA: NEGATIVE mg/dL
Ketones, ur: NEGATIVE mg/dL
Leukocytes,Ua: NEGATIVE
Nitrite: NEGATIVE
Protein, ur: NEGATIVE mg/dL
Specific Gravity, Urine: 1.006 (ref 1.005–1.030)
pH: 8 (ref 5.0–8.0)

## 2020-10-04 LAB — CBC
HCT: 40.8 % (ref 36.0–46.0)
Hemoglobin: 14.6 g/dL (ref 12.0–15.0)
MCH: 33.8 pg (ref 26.0–34.0)
MCHC: 35.8 g/dL (ref 30.0–36.0)
MCV: 94.4 fL (ref 80.0–100.0)
Platelets: 278 10*3/uL (ref 150–400)
RBC: 4.32 MIL/uL (ref 3.87–5.11)
RDW: 12.2 % (ref 11.5–15.5)
WBC: 10 10*3/uL (ref 4.0–10.5)
nRBC: 0 % (ref 0.0–0.2)

## 2020-10-04 LAB — COMPREHENSIVE METABOLIC PANEL
ALT: 17 U/L (ref 0–44)
AST: 17 U/L (ref 15–41)
Albumin: 3.9 g/dL (ref 3.5–5.0)
Alkaline Phosphatase: 57 U/L (ref 38–126)
Anion gap: 11 (ref 5–15)
BUN: 11 mg/dL (ref 6–20)
CO2: 22 mmol/L (ref 22–32)
Calcium: 9.2 mg/dL (ref 8.9–10.3)
Chloride: 106 mmol/L (ref 98–111)
Creatinine, Ser: 0.85 mg/dL (ref 0.44–1.00)
GFR, Estimated: >60 mL/min (ref >60)
Glucose, Bld: 92 mg/dL (ref 70–99)
Potassium: 3.6 mmol/L (ref 3.5–5.1)
Sodium: 139 mmol/L (ref 135–145)
Total Bilirubin: 1.2 mg/dL (ref 0.3–1.2)
Total Protein: 7.1 g/dL (ref 6.5–8.1)

## 2020-10-04 LAB — BLOOD GAS, ARTERIAL
Acid-base deficit: 1.1 mmol/L (ref 0.0–2.0)
Bicarbonate: 22.7 mmol/L (ref 20.0–28.0)
Drawn by: 602861
FIO2: 21
O2 Saturation: 98.4 %
Patient temperature: 37
pCO2 arterial: 35.6 mmHg (ref 32.0–48.0)
pH, Arterial: 7.421 (ref 7.350–7.450)
pO2, Arterial: 112 mmHg — ABNORMAL HIGH (ref 83.0–108.0)

## 2020-10-04 LAB — SURGICAL PCR SCREEN
MRSA, PCR: NEGATIVE
Staphylococcus aureus: NEGATIVE

## 2020-10-04 LAB — APTT: aPTT: 28 seconds (ref 24–36)

## 2020-10-04 LAB — SARS CORONAVIRUS 2 (TAT 6-24 HRS): SARS Coronavirus 2: NEGATIVE

## 2020-10-04 LAB — NO BLOOD PRODUCTS

## 2020-10-04 NOTE — Progress Notes (Incomplete)
PCP - K. Fairton Cardiologist - NA    -  Chest x-ray - 10/04/20 EKG - 10/04/20 Stress Test - NA ECHO - 9/20 Cardiac Cath - NA      Blood Thinner Instructions:NA Aspirin Instructions:STOP  ERAS Protcol -NA COVID TEST- 10/04/20   Anesthesia review: PT IS JEHOVAH WITNESS. FORM SIGNED  Patient denies shortness of breath, fever, cough and chest pain at PAT appointment   All instructions explained to the patient, with a verbal understanding of the material. Patient agrees to go over the instructions while at home for a better understanding. Patient also instructed to self quarantine after being tested for COVID-19. The opportunity to ask questions was provided.

## 2020-10-05 NOTE — Anesthesia Preprocedure Evaluation (Addendum)
Anesthesia Evaluation  Patient identified by MRN, date of birth, ID band Patient awake    Reviewed: Allergy & Precautions, NPO status , Patient's Chart, lab work & pertinent test results  History of Anesthesia Complications (+) PONV and history of anesthetic complications  Airway Mallampati: II  TM Distance: >3 FB Neck ROM: Full    Dental no notable dental hx. (+) Dental Advisory Given   Pulmonary neg pulmonary ROS,    Pulmonary exam normal        Cardiovascular negative cardio ROS Normal cardiovascular exam     Neuro/Psych negative neurological ROS  negative psych ROS   GI/Hepatic negative GI ROS, Neg liver ROS,   Endo/Other  Obesity  Renal/GU negative Renal ROS  negative genitourinary   Musculoskeletal   Abdominal (+) - obese,   Peds  Hematology  (+) anemia , Hodgkin's lymphoma   Anesthesia Other Findings   Reproductive/Obstetrics                            Anesthesia Physical  Anesthesia Plan  ASA: II  Anesthesia Plan: General   Post-op Pain Management:    Induction: Intravenous  PONV Risk Score and Plan: 4 or greater and Scopolamine patch - Pre-op, Midazolam, Ondansetron, Dexamethasone and Treatment may vary due to age or medical condition  Airway Management Planned: LMA  Additional Equipment: Arterial line  Intra-op Plan:   Post-operative Plan: Extubation in OR  Informed Consent: I have reviewed the patients History and Physical, chart, labs and discussed the procedure including the risks, benefits and alternatives for the proposed anesthesia with the patient or authorized representative who has indicated his/her understanding and acceptance.     Dental advisory given  Plan Discussed with: Anesthesiologist and CRNA  Anesthesia Plan Comments:       Anesthesia Quick Evaluation

## 2020-10-06 ENCOUNTER — Encounter (HOSPITAL_COMMUNITY)
Admission: RE | Disposition: A | Discharge status: Home / Self Care | Payer: Self-pay | Source: Home / Self Care | Attending: Thoracic Surgery (Cardiothoracic Vascular Surgery)

## 2020-10-06 ENCOUNTER — Other Ambulatory Visit: Payer: Self-pay

## 2020-10-06 ENCOUNTER — Observation Stay (HOSPITAL_COMMUNITY): Payer: 59

## 2020-10-06 ENCOUNTER — Inpatient Hospital Stay (HOSPITAL_COMMUNITY): Payer: 59 | Admitting: Physician Assistant

## 2020-10-06 ENCOUNTER — Inpatient Hospital Stay (HOSPITAL_COMMUNITY): Payer: 59 | Admitting: Certified Registered Nurse Anesthetist

## 2020-10-06 ENCOUNTER — Inpatient Hospital Stay (HOSPITAL_COMMUNITY)
Admission: RE | Admit: 2020-10-06 | Discharge: 2020-10-07 | DRG: 804 | Disposition: A | Discharge status: Home / Self Care | Payer: 59 | Attending: Thoracic Surgery (Cardiothoracic Vascular Surgery) | Admitting: Thoracic Surgery (Cardiothoracic Vascular Surgery)

## 2020-10-06 ENCOUNTER — Encounter (HOSPITAL_COMMUNITY): Payer: Self-pay | Admitting: Thoracic Surgery (Cardiothoracic Vascular Surgery)

## 2020-10-06 DIAGNOSIS — Z9889 Other specified postprocedural states: Secondary | ICD-10-CM

## 2020-10-06 DIAGNOSIS — R590 Localized enlarged lymph nodes: Principal | ICD-10-CM | POA: Diagnosis present

## 2020-10-06 DIAGNOSIS — Z20822 Contact with and (suspected) exposure to covid-19: Secondary | ICD-10-CM | POA: Diagnosis present

## 2020-10-06 DIAGNOSIS — J302 Other seasonal allergic rhinitis: Secondary | ICD-10-CM | POA: Diagnosis present

## 2020-10-06 DIAGNOSIS — Z8571 Personal history of Hodgkin lymphoma: Secondary | ICD-10-CM

## 2020-10-06 DIAGNOSIS — D259 Leiomyoma of uterus, unspecified: Secondary | ICD-10-CM | POA: Diagnosis present

## 2020-10-06 DIAGNOSIS — N83291 Other ovarian cyst, right side: Secondary | ICD-10-CM | POA: Diagnosis present

## 2020-10-06 DIAGNOSIS — Z79899 Other long term (current) drug therapy: Secondary | ICD-10-CM

## 2020-10-06 DIAGNOSIS — N361 Urethral diverticulum: Secondary | ICD-10-CM | POA: Diagnosis present

## 2020-10-06 DIAGNOSIS — D649 Anemia, unspecified: Secondary | ICD-10-CM | POA: Diagnosis present

## 2020-10-06 DIAGNOSIS — E669 Obesity, unspecified: Secondary | ICD-10-CM | POA: Diagnosis present

## 2020-10-06 DIAGNOSIS — Z6838 Body mass index (BMI) 38.0-38.9, adult: Secondary | ICD-10-CM | POA: Diagnosis not present

## 2020-10-06 DIAGNOSIS — Z9221 Personal history of antineoplastic chemotherapy: Secondary | ICD-10-CM

## 2020-10-06 DIAGNOSIS — K7689 Other specified diseases of liver: Secondary | ICD-10-CM | POA: Diagnosis present

## 2020-10-06 DIAGNOSIS — J9811 Atelectasis: Secondary | ICD-10-CM | POA: Diagnosis not present

## 2020-10-06 HISTORY — PX: LYMPH NODE BIOPSY: SHX201

## 2020-10-06 HISTORY — PX: INTERCOSTAL NERVE BLOCK: SHX5021

## 2020-10-06 LAB — POCT PREGNANCY, URINE: Preg Test, Ur: NEGATIVE

## 2020-10-06 LAB — GLUCOSE, CAPILLARY
Glucose-Capillary: 138 mg/dL — ABNORMAL HIGH (ref 70–99)
Glucose-Capillary: 133 mg/dL — ABNORMAL HIGH (ref 70–99)

## 2020-10-06 SURGERY — THORACOSCOPY, ROBOT-ASSISTED
Anesthesia: General | Site: Chest | Laterality: Left

## 2020-10-06 MED ORDER — MORPHINE SULFATE (PF) 2 MG/ML IV SOLN: 2.0000 mg | INTRAVENOUS | Status: DC | PRN

## 2020-10-06 MED ORDER — BUPIVACAINE LIPOSOME 1.3 % IJ SUSP
INTRAMUSCULAR | Status: DC | PRN
  Administered 2020-10-06: 33 mL

## 2020-10-06 MED ORDER — SODIUM CHLORIDE 0.9 % IV SOLN
INTRAVENOUS | Status: AC | PRN
  Administered 2020-10-06: 1000 mL via INTRAMUSCULAR

## 2020-10-06 MED ORDER — DEXAMETHASONE SODIUM PHOSPHATE 10 MG/ML IJ SOLN
INTRAMUSCULAR | Status: AC
  Filled 2020-10-06: qty 1

## 2020-10-06 MED ORDER — FENTANYL CITRATE (PF) 250 MCG/5ML IJ SOLN
INTRAMUSCULAR | Status: AC
  Filled 2020-10-06: qty 5

## 2020-10-06 MED ORDER — CEFAZOLIN SODIUM-DEXTROSE 2-4 GM/100ML-% IV SOLN
2.0000 g | INTRAVENOUS | Status: AC
  Administered 2020-10-06: 2 g via INTRAVENOUS
  Filled 2020-10-06: qty 100

## 2020-10-06 MED ORDER — PROPOFOL 10 MG/ML IV BOLUS
INTRAVENOUS | Status: AC
  Filled 2020-10-06: qty 20

## 2020-10-06 MED ORDER — FENTANYL CITRATE (PF) 250 MCG/5ML IJ SOLN
INTRAMUSCULAR | Status: DC | PRN
  Administered 2020-10-06 (×2): 100 ug via INTRAVENOUS
  Administered 2020-10-06: 50 ug via INTRAVENOUS

## 2020-10-06 MED ORDER — ONDANSETRON HCL 4 MG/2ML IJ SOLN
INTRAMUSCULAR | Status: DC | PRN
  Administered 2020-10-06: 4 mg via INTRAVENOUS

## 2020-10-06 MED ORDER — PROPOFOL 10 MG/ML IV BOLUS
INTRAVENOUS | Status: DC | PRN
  Administered 2020-10-06 (×2): 200 mg via INTRAVENOUS

## 2020-10-06 MED ORDER — HEMOSTATIC AGENTS (NO CHARGE) OPTIME
TOPICAL | Status: DC | PRN
Start: 2020-10-06 — End: 2020-10-06
  Administered 2020-10-06 (×2): 1 via TOPICAL

## 2020-10-06 MED ORDER — ONDANSETRON HCL 4 MG/2ML IJ SOLN
4.0000 mg | Freq: Four times a day (QID) | INTRAMUSCULAR | Status: DC | PRN
  Administered 2020-10-06: 4 mg via INTRAVENOUS
  Filled 2020-10-06: qty 2

## 2020-10-06 MED ORDER — KETOROLAC TROMETHAMINE 15 MG/ML IJ SOLN
15.0000 mg | Freq: Four times a day (QID) | INTRAMUSCULAR | Status: AC
  Administered 2020-10-06 – 2020-10-07 (×4): 15 mg via INTRAVENOUS
  Filled 2020-10-06 (×3): qty 1

## 2020-10-06 MED ORDER — ROCURONIUM BROMIDE 10 MG/ML (PF) SYRINGE
PREFILLED_SYRINGE | INTRAVENOUS | Status: DC | PRN
  Administered 2020-10-06: 20 mg via INTRAVENOUS
  Administered 2020-10-06 (×2): 70 mg via INTRAVENOUS

## 2020-10-06 MED ORDER — FENTANYL CITRATE (PF) 100 MCG/2ML IJ SOLN
INTRAMUSCULAR | Status: AC
  Filled 2020-10-06: qty 2

## 2020-10-06 MED ORDER — MIDAZOLAM HCL 2 MG/2ML IJ SOLN
INTRAMUSCULAR | Status: AC
  Filled 2020-10-06: qty 2

## 2020-10-06 MED ORDER — ACETAMINOPHEN 500 MG PO TABS
1000.0000 mg | ORAL_TABLET | Freq: Once | ORAL | Status: AC
  Administered 2020-10-06: 1000 mg via ORAL
  Filled 2020-10-06: qty 2

## 2020-10-06 MED ORDER — KETOROLAC TROMETHAMINE 15 MG/ML IJ SOLN
15.0000 mg | Freq: Once | INTRAMUSCULAR | Status: AC
  Administered 2020-10-06: 15 mg via INTRAVENOUS

## 2020-10-06 MED ORDER — ACETAMINOPHEN 500 MG PO TABS
1000.0000 mg | ORAL_TABLET | Freq: Four times a day (QID) | ORAL | Status: DC
  Administered 2020-10-06 – 2020-10-07 (×4): 1000 mg via ORAL
  Filled 2020-10-06 (×4): qty 2

## 2020-10-06 MED ORDER — LIDOCAINE 2% (20 MG/ML) 5 ML SYRINGE
INTRAMUSCULAR | Status: DC | PRN
  Administered 2020-10-06: 60 mg via INTRAVENOUS
  Administered 2020-10-06: 40 mg via INTRAVENOUS

## 2020-10-06 MED ORDER — PHENYLEPHRINE 40 MCG/ML (10ML) SYRINGE FOR IV PUSH (FOR BLOOD PRESSURE SUPPORT)
PREFILLED_SYRINGE | INTRAVENOUS | Status: DC | PRN
  Administered 2020-10-06 (×2): 80 ug via INTRAVENOUS
  Administered 2020-10-06: 120 ug via INTRAVENOUS
  Administered 2020-10-06: 80 ug via INTRAVENOUS

## 2020-10-06 MED ORDER — ENOXAPARIN SODIUM 40 MG/0.4ML ~~LOC~~ SOLN
40.0000 mg | Freq: Every day | SUBCUTANEOUS | Status: DC
  Administered 2020-10-06: 40 mg via SUBCUTANEOUS
  Filled 2020-10-06: qty 0.4

## 2020-10-06 MED ORDER — LACTATED RINGERS IV SOLN: INTRAVENOUS | Status: DC | PRN

## 2020-10-06 MED ORDER — INSULIN ASPART 100 UNIT/ML ~~LOC~~ SOLN
0.0000 [IU] | Freq: Three times a day (TID) | SUBCUTANEOUS | Status: DC
  Administered 2020-10-06: 2 [IU] via SUBCUTANEOUS

## 2020-10-06 MED ORDER — BUPIVACAINE HCL (PF) 0.5 % IJ SOLN
INTRAMUSCULAR | Status: AC
  Filled 2020-10-06: qty 30

## 2020-10-06 MED ORDER — CEFAZOLIN SODIUM-DEXTROSE 2-4 GM/100ML-% IV SOLN
2.0000 g | Freq: Three times a day (TID) | INTRAVENOUS | Status: AC
Start: 2020-10-06 — End: 2020-10-06
  Administered 2020-10-06 (×2): 2 g via INTRAVENOUS
  Filled 2020-10-06 (×2): qty 100

## 2020-10-06 MED ORDER — TRAMADOL HCL 50 MG PO TABS
ORAL_TABLET | ORAL | Status: AC
  Filled 2020-10-06: qty 1

## 2020-10-06 MED ORDER — PHENYLEPHRINE HCL-NACL 10-0.9 MG/250ML-% IV SOLN
INTRAVENOUS | Status: DC | PRN
  Administered 2020-10-06: 20 ug/min via INTRAVENOUS

## 2020-10-06 MED ORDER — KETOROLAC TROMETHAMINE 15 MG/ML IJ SOLN
INTRAMUSCULAR | Status: AC
  Filled 2020-10-06: qty 1

## 2020-10-06 MED ORDER — ONDANSETRON HCL 4 MG/2ML IJ SOLN
INTRAMUSCULAR | Status: AC
  Filled 2020-10-06: qty 2

## 2020-10-06 MED ORDER — MIDAZOLAM HCL 2 MG/2ML IJ SOLN
INTRAMUSCULAR | Status: DC | PRN
  Administered 2020-10-06: 2 mg via INTRAVENOUS

## 2020-10-06 MED ORDER — 0.9 % SODIUM CHLORIDE (POUR BTL) OPTIME
TOPICAL | Status: DC | PRN
  Administered 2020-10-06 (×2): 1000 mL

## 2020-10-06 MED ORDER — CELECOXIB 200 MG PO CAPS
200.0000 mg | ORAL_CAPSULE | Freq: Once | ORAL | Status: AC
  Administered 2020-10-06: 200 mg via ORAL
  Filled 2020-10-06: qty 1

## 2020-10-06 MED ORDER — SENNOSIDES-DOCUSATE SODIUM 8.6-50 MG PO TABS
1.0000 | ORAL_TABLET | Freq: Every day | ORAL | Status: DC
  Administered 2020-10-06: 1 via ORAL
  Filled 2020-10-06: qty 1

## 2020-10-06 MED ORDER — DEXAMETHASONE SODIUM PHOSPHATE 10 MG/ML IJ SOLN
INTRAMUSCULAR | Status: DC | PRN
  Administered 2020-10-06: 10 mg via INTRAVENOUS

## 2020-10-06 MED ORDER — AMISULPRIDE (ANTIEMETIC) 5 MG/2ML IV SOLN: 10.0000 mg | Freq: Once | INTRAVENOUS | Status: DC | PRN

## 2020-10-06 MED ORDER — TRAMADOL HCL 50 MG PO TABS
50.0000 mg | ORAL_TABLET | Freq: Four times a day (QID) | ORAL | Status: DC | PRN
  Administered 2020-10-06: 50 mg via ORAL

## 2020-10-06 MED ORDER — BISACODYL 5 MG PO TBEC
10.0000 mg | DELAYED_RELEASE_TABLET | Freq: Every day | ORAL | Status: DC
  Administered 2020-10-07: 10 mg via ORAL
  Filled 2020-10-06: qty 2

## 2020-10-06 MED ORDER — SODIUM CHLORIDE 0.9 % IV SOLN: INTRAVENOUS | Status: DC

## 2020-10-06 MED ORDER — CHLORHEXIDINE GLUCONATE 0.12 % MT SOLN
OROMUCOSAL | Status: AC
  Administered 2020-10-06: 15 mL
  Filled 2020-10-06: qty 15

## 2020-10-06 MED ORDER — BUPIVACAINE LIPOSOME 1.3 % IJ SUSP
20.0000 mL | Freq: Once | INTRAMUSCULAR | Status: DC
  Filled 2020-10-06: qty 20

## 2020-10-06 MED ORDER — FENTANYL CITRATE (PF) 100 MCG/2ML IJ SOLN
25.0000 ug | INTRAMUSCULAR | Status: DC | PRN
  Administered 2020-10-06 (×3): 50 ug via INTRAVENOUS

## 2020-10-06 MED ORDER — SCOPOLAMINE 1 MG/3DAYS TD PT72
1.0000 | MEDICATED_PATCH | TRANSDERMAL | Status: DC
  Filled 2020-10-06: qty 1

## 2020-10-06 MED ORDER — ACETAMINOPHEN 160 MG/5ML PO SOLN: 1000.0000 mg | Freq: Four times a day (QID) | ORAL | Status: DC

## 2020-10-06 MED ORDER — SUGAMMADEX SODIUM 200 MG/2ML IV SOLN
INTRAVENOUS | Status: DC | PRN
  Administered 2020-10-06 (×2): 200 mg via INTRAVENOUS

## 2020-10-06 SURGICAL SUPPLY — 108 items
ADH SKN CLS APL DERMABOND .7 (GAUZE/BANDAGES/DRESSINGS) ×3
APPLIER CLIP ROT 10 11.4 M/L (STAPLE)
APR CLP MED LRG 11.4X10 (STAPLE)
BAG SPEC RTRVL C125 8X14 (MISCELLANEOUS) ×3
BLADE CLIPPER SURG (BLADE) ×3 IMPLANT
CANISTER SUCT 3000ML PPV (MISCELLANEOUS) ×5 IMPLANT
CANNULA REDUC XI 12-8 STAPL (CANNULA) ×8
CANNULA REDUCER 12-8 DVNC XI (CANNULA) ×6 IMPLANT
CATH THORACIC 28FR (CATHETERS) IMPLANT
CATH THORACIC 28FR RT ANG (CATHETERS) IMPLANT
CATH THORACIC 36FR (CATHETERS) IMPLANT
CATH THORACIC 36FR RT ANG (CATHETERS) IMPLANT
CLIP APPLIE ROT 10 11.4 M/L (STAPLE) IMPLANT
CLIP VESOCCLUDE MED 6/CT (CLIP) IMPLANT
CNTNR URN SCR LID CUP LEK RST (MISCELLANEOUS) ×15 IMPLANT
CONN ST 1/4X3/8  BEN (MISCELLANEOUS)
CONN ST 1/4X3/8 BEN (MISCELLANEOUS) IMPLANT
CONN Y 3/8X3/8X3/8  BEN (MISCELLANEOUS)
CONN Y 3/8X3/8X3/8 BEN (MISCELLANEOUS) IMPLANT
CONT SPEC 4OZ STRL OR WHT (MISCELLANEOUS) ×20
COVER BACK TABLE 80X110 HD (DRAPES) ×1 IMPLANT
DEFOGGER ANTIFOG KIT (MISCELLANEOUS) ×1 IMPLANT
DEFOGGER SCOPE WARMER CLEARIFY (MISCELLANEOUS) ×4 IMPLANT
DERMABOND ADVANCED (GAUZE/BANDAGES/DRESSINGS) ×1
DERMABOND ADVANCED .7 DNX12 (GAUZE/BANDAGES/DRESSINGS) IMPLANT
DRAIN CHANNEL 28F RND 3/8 FF (WOUND CARE) IMPLANT
DRAIN CHANNEL 32F RND 10.7 FF (WOUND CARE) IMPLANT
DRAIN CONNECTOR BLAKE 1:1 (MISCELLANEOUS) ×1 IMPLANT
DRAPE ARM DVNC X/XI (DISPOSABLE) ×12 IMPLANT
DRAPE COLUMN DVNC XI (DISPOSABLE) ×3 IMPLANT
DRAPE CV SPLIT W-CLR ANES SCRN (DRAPES) ×4 IMPLANT
DRAPE DA VINCI XI ARM (DISPOSABLE) ×16
DRAPE DA VINCI XI COLUMN (DISPOSABLE) ×4
DRAPE HALF SHEET 40X57 (DRAPES) ×1 IMPLANT
DRAPE INCISE IOBAN 66X45 STRL (DRAPES) ×1 IMPLANT
DRAPE ORTHO SPLIT 77X108 STRL (DRAPES) ×4
DRAPE SURG ORHT 6 SPLT 77X108 (DRAPES) ×3 IMPLANT
DRAPE WARM FLUID 44X44 (DRAPES) ×4 IMPLANT
ELECT BLADE 6.5 EXT (BLADE) IMPLANT
ELECT REM PT RETURN 9FT ADLT (ELECTROSURGICAL) ×4
ELECTRODE REM PT RTRN 9FT ADLT (ELECTROSURGICAL) ×3 IMPLANT
GAUZE KITTNER 4X5 RF (MISCELLANEOUS) ×1 IMPLANT
GAUZE SPONGE 4X4 12PLY STRL (GAUZE/BANDAGES/DRESSINGS) ×4 IMPLANT
GLOVE SURG UNDER POLY LF SZ6.5 (GLOVE) ×2 IMPLANT
GLOVE TRIUMPH SURG SIZE 7.5 (KITS) ×8 IMPLANT
GOWN STRL REUS W/ TWL LRG LVL3 (GOWN DISPOSABLE) ×6 IMPLANT
GOWN STRL REUS W/ TWL XL LVL3 (GOWN DISPOSABLE) ×9 IMPLANT
GOWN STRL REUS W/TWL 2XL LVL3 (GOWN DISPOSABLE) ×7 IMPLANT
GOWN STRL REUS W/TWL LRG LVL3 (GOWN DISPOSABLE) ×12
GOWN STRL REUS W/TWL XL LVL3 (GOWN DISPOSABLE) ×8
HEMOSTAT SURGICEL 2X14 (HEMOSTASIS) ×11 IMPLANT
IRRIGATION STRYKERFLOW (MISCELLANEOUS) ×3 IMPLANT
IRRIGATOR STRYKERFLOW (MISCELLANEOUS) ×4
IRRIGATOR SUCT 8 DISP DVNC XI (IRRIGATION / IRRIGATOR) IMPLANT
IRRIGATOR SUCTION 8MM XI DISP (IRRIGATION / IRRIGATOR)
KIT BASIN OR (CUSTOM PROCEDURE TRAY) ×4 IMPLANT
KIT SUCTION CATH 14FR (SUCTIONS) IMPLANT
KIT TURNOVER KIT B (KITS) ×4 IMPLANT
LOOP VESSEL SUPERMAXI WHITE (MISCELLANEOUS) IMPLANT
NDL HYPO 25GX1X1/2 BEV (NEEDLE) ×3 IMPLANT
NDL SPNL 18GX3.5 QUINCKE PK (NEEDLE) ×3 IMPLANT
NEEDLE HYPO 25GX1X1/2 BEV (NEEDLE) ×4 IMPLANT
NEEDLE SPNL 18GX3.5 QUINCKE PK (NEEDLE) ×4 IMPLANT
NS IRRIG 1000ML POUR BTL (IV SOLUTION) ×5 IMPLANT
PACK CHEST (CUSTOM PROCEDURE TRAY) ×4 IMPLANT
PAD ARMBOARD 7.5X6 YLW CONV (MISCELLANEOUS) ×8 IMPLANT
SCISSORS LAP 5X35 DISP (ENDOMECHANICALS) IMPLANT
SEAL CANN UNIV 5-8 DVNC XI (MISCELLANEOUS) ×6 IMPLANT
SEAL XI 5MM-8MM UNIVERSAL (MISCELLANEOUS) ×12
SEALANT PROGEL (MISCELLANEOUS) IMPLANT
SEALANT SURG COSEAL 4ML (VASCULAR PRODUCTS) IMPLANT
SEALANT SURG COSEAL 8ML (VASCULAR PRODUCTS) IMPLANT
SET TUBE SMOKE EVAC HIGH FLOW (TUBING) IMPLANT
SHEARS HARMONIC HDI 20CM (ELECTROSURGICAL) IMPLANT
SOLUTION ELECTROLUBE (MISCELLANEOUS) ×1 IMPLANT
SPECIMEN JAR MEDIUM (MISCELLANEOUS) IMPLANT
SPONGE INTESTINAL PEANUT (DISPOSABLE) IMPLANT
SPONGE TONSIL TAPE 1 RFD (DISPOSABLE) IMPLANT
STAPLER CANNULA SEAL DVNC XI (STAPLE) ×6 IMPLANT
STAPLER CANNULA SEAL XI (STAPLE) ×8
SUT PDS AB 3-0 SH 27 (SUTURE) IMPLANT
SUT PROLENE 4 0 RB 1 (SUTURE)
SUT PROLENE 4-0 RB1 .5 CRCL 36 (SUTURE) IMPLANT
SUT SILK  1 MH (SUTURE) ×8
SUT SILK 1 MH (SUTURE) ×3 IMPLANT
SUT SILK 1 TIES 10X30 (SUTURE) IMPLANT
SUT SILK 2 0 SH (SUTURE) IMPLANT
SUT SILK 2 0SH CR/8 30 (SUTURE) ×1 IMPLANT
SUT SILK 3 0 SH 30 (SUTURE) IMPLANT
SUT SILK 3 0SH CR/8 30 (SUTURE) IMPLANT
SUT VIC AB 1 CTX 36 (SUTURE)
SUT VIC AB 1 CTX36XBRD ANBCTR (SUTURE) IMPLANT
SUT VIC AB 2-0 CTX 36 (SUTURE) IMPLANT
SUT VIC AB 3-0 MH 27 (SUTURE) IMPLANT
SUT VIC AB 3-0 X1 27 (SUTURE) ×8 IMPLANT
SUT VICRYL 0 TIES 12 18 (SUTURE) ×4 IMPLANT
SUT VICRYL 0 UR6 27IN ABS (SUTURE) ×8 IMPLANT
SUT VICRYL 2 TP 1 (SUTURE) IMPLANT
SYR 30ML LL (SYRINGE) ×4 IMPLANT
SYSTEM RETRIEVAL ANCHOR 8 (MISCELLANEOUS) ×1 IMPLANT
SYSTEM SAHARA CHEST DRAIN ATS (WOUND CARE) ×4 IMPLANT
TAPE CLOTH 4X10 WHT NS (GAUZE/BANDAGES/DRESSINGS) ×4 IMPLANT
TAPE CLOTH SURG 4X10 WHT LF (GAUZE/BANDAGES/DRESSINGS) ×1 IMPLANT
TIP APPLICATOR SPRAY EXTEND 16 (VASCULAR PRODUCTS) IMPLANT
TOWEL GREEN STERILE (TOWEL DISPOSABLE) ×4 IMPLANT
TRAY FOLEY MTR SLVR 16FR STAT (SET/KITS/TRAYS/PACK) ×4 IMPLANT
TROCAR XCEL 12X100 BLDLESS (ENDOMECHANICALS) ×4 IMPLANT
WATER STERILE IRR 1000ML POUR (IV SOLUTION) ×4 IMPLANT

## 2020-10-06 NOTE — Anesthesia Postprocedure Evaluation (Signed)
Anesthesia Post Note  Patient: Elizabeth Beasley  Procedure(s) Performed: XI ROBOTIC ASSISTED THORASCOPY (Left Chest) MEDIASTINAL LYMPH NODE BIOPSY (Left ) INTERCOSTAL NERVE BLOCK     Patient location during evaluation: PACU Anesthesia Type: General Level of consciousness: sedated Pain management: pain level controlled Vital Signs Assessment: post-procedure vital signs reviewed and stable Respiratory status: spontaneous breathing and respiratory function stable Cardiovascular status: stable Postop Assessment: no apparent nausea or vomiting Anesthetic complications: no   No complications documented.  Last Vitals:  Vitals:   10/06/20 1043 10/06/20 1100  BP: 104/63 102/87  Pulse: 69 69  Resp: 19 18  Temp:    SpO2: 99% 100%    Last Pain:  Vitals:   10/06/20 1100  TempSrc:   PainSc: Asleep                 Kaleb Sek DANIEL

## 2020-10-06 NOTE — Transfer of Care (Signed)
Immediate Anesthesia Transfer of Care Note  Patient: Elizabeth Beasley  Procedure(s) Performed: XI ROBOTIC ASSISTED THORASCOPY (Left Chest) MEDIASTINAL LYMPH NODE BIOPSY (Left ) INTERCOSTAL NERVE BLOCK  Patient Location: PACU  Anesthesia Type:General  Level of Consciousness: awake, alert  and oriented  Airway & Oxygen Therapy: Patient Spontanous Breathing and Patient connected to face mask oxygen  Post-op Assessment: Report given to RN and Post -op Vital signs reviewed and stable  Post vital signs: Reviewed and stable  Last Vitals:  Vitals Value Taken Time  BP 107/51 10/06/20 1000  Temp    Pulse 79 10/06/20 1001  Resp 27 10/06/20 1001  SpO2 99 % 10/06/20 1001  Vitals shown include unvalidated device data.  Last Pain:  Vitals:   10/06/20 0612  TempSrc:   PainSc: 0-No pain      Patients Stated Pain Goal: 3 (16/94/50 3888)  Complications: No complications documented.

## 2020-10-06 NOTE — Discharge Summary (Addendum)
[address]       [place] [address]             [phone]   Physician Discharge Summary  Patient ID: Elizabeth Beasley MRN: [id number] DOB/AGE: [birth year] 40 y.o.  Admit date: 10/06/2020 Discharge date: 10/07/2020  Admission Diagnoses: Anterior mediastinal adenopathy with history of Hodgkin's lymphoma  Patient Active Problem List   Diagnosis Date Noted  . Port-A-Cath in place 06/25/2019  . Counseling regarding advance care planning and goals of care 06/14/2019  . Hodgkin's lymphoma, nodular sclerosis ([employer]) 06/08/2019  . Iron deficiency anemia due to chronic blood loss 06/01/2019  . Lymphadenopathy, cervical 05/21/2019  . Family history of colon cancer in father 07/10/2017  . Obesity 04/25/2016  . Abnormal weight gain 04/25/2016  . Cervical paraspinal muscle spasm 03/11/2016  . Urinary urgency 11/12/2007  . Headache 11/11/2007  . Chronic rhinitis 11/11/2007    Discharge Diagnoses:   Anterior mediastinal adenopathy with history of Hodgkin's lymphoma  S/P robot-assisted left thoracoscopy for biopsy of anterior mediastinal lymph nodes   Discharged Condition: good  HPI:   Elizabeth Beasley is a 40 year old woman with a history of stage III nodular sclerosing Hodgkin's lymphoma, anemia, and urethral diverticulum.  She presented initially with anemia, fatigue and then noted cervical lymphadenopathy.  Biopsy showed nodular sclerosing Hodgkin's disease.  She was treated for that.  She recently saw Dr. Irene Limbo and had a repeat PET/CT.  There were some anterior mediastinal nodes that were similar size and metabolic activity to her previous PET/CT.  There also was a right paratracheal node but it was less active than the anterior mediastinal nodes.  Currently she is feeling well.  She has lost 13 pounds with weight watchers.  Her appetite is good.  She is not having any fevers, chills, or night sweats.  She denies fatigue.  She is a Sales promotion account executive [employer] and  would refuse any blood products.  Hospital Course:   On 10/06/2020 [name] underwent a robotic-assisted anterior mediastinal lymph node dissection with Dr. Roxan Hockey. She tolerated the procedure well was initially recovered in the postanesthesia care unit and later transferred to [facility] progressive care.  Pain was controlled with Toradol, Tylenol, and a few doses of IV morphine.  Once fully awake, she was allowed to resume oral intake and she tolerated this without any problems.  She had minimal drainage from the chest tube overnight so it was removed on postop day 1.  Her respiratory status was stable.  She had mild sinus tachycardia on postop day 1 which she tolerated without any problems.  She was ambulated following chest tube removal and tolerated this well.  She was felt to be ready for discharge to home and she agreed with this plan.  Consults: None  Significant Diagnostic Studies:   EXAM: PORTABLE CHEST 1 VIEW  COMPARISON:  Yesterday  FINDINGS: Left chest tube with tip at the apex, unchanged. No visible pneumothorax. Mildly increased atelectasis about the left hilum, with elevation of the left diaphragm. No effusion or pneumothorax. Porta catheter with tip at the upper cavoatrial junction. Normal heart size. Known mediastinal adenopathy.  IMPRESSION: Increased left perihilar atelectasis.  No visible pneumothorax.   Electronically Signed   By: Monte Fantasia M.D.   On: 10/07/2020 05:51  Treatments:   OPERATIVE REPORT  DATE OF PROCEDURE:  10/06/2020  PREOPERATIVE DIAGNOSIS:  Anterior mediastinal adenopathy with history of Hodgkin's disease, post-treatment.  POSTOPERATIVE DIAGNOSIS:  Anterior mediastinal adenopathy with history of  Hodgkin disease, post-treatment.  PROCEDURE: 1.  Xi robotic-assisted left thoracoscopy. 2.  Anterior mediastinal lymph node biopsy.  SURGEON:  Modesto Charon, MD  ASSISTANT:  Nicholes Rough, PA  ANESTHESIA:   General.  FINDINGS:  Multiple enlarged anterior mediastinal lymph nodes.  Specimen sent for permanent pathology.  CLINICAL NOTE:  The patient is a 40 year old woman with a history of stage III nodular sclerosing Hodgkin's lymphoma.  She has undergone treatment for that.  She recently had a repeat PET/CT, which showed some anterior mediastinal adenopathy with  metabolic activity.  It was unclear if this represented residual treated disease or if there was active disease that would require additional therapy.  She was referred for lymph node biopsy.  The patient was offered a robotic approach via a left VATS.   The indications, risks, benefits, and alternatives were discussed in detail with the patient.  She understood and accepted the risks and agreed to proceed.   Discharge Exam: Blood pressure 110/62, pulse (!) 101, temperature 98.1 F (36.7 C), temperature source Oral, resp. rate 20, height 5\' 4"  (1.626 m), weight 100.2 kg, last menstrual period 10/02/2020, SpO2 98 %.   General appearance: alert, cooperative and no distress Neurologic: intact Heart: tachy regular Lungs: clear to auscultation bilaterally no air leak, mininal serous drainage from chest tube  Disposition: Discharged home in stable condition   Allergies as of 10/07/2020   No Known Allergies     Medication List    TAKE these medications   acetaminophen 325 MG tablet Commonly known as: Tylenol Take 2 tablets (650 mg total) by mouth every 4 (four) hours as needed.   b complex vitamins capsule Take 1 capsule by mouth daily.   cholecalciferol 25 MCG (1000 UNIT) tablet Commonly known as: VITAMIN D3 Take 2,000 Units by mouth daily.   fluticasone 50 MCG/ACT nasal spray Commonly known as: FLONASE Place 2 sprays into both nostrils daily as needed for allergies or rhinitis.   levonorgestrel 20 MCG/24HR IUD Commonly known as: MIRENA 1 each by Intrauterine route once.   loratadine 10 MG tablet Commonly known as:  CLARITIN Take 10 mg by mouth daily.   oxyCODONE-acetaminophen 5-325 MG tablet Commonly known as: Percocet Take 1 tablet by mouth every 4 (four) hours as needed for up to 3 days for severe pain.   REFRESH OP Place 1 drop into both eyes daily as needed (dry eyes).       Follow-up Information    [name], MD. Go on 10/24/2020.   Specialty: Cardiothoracic Surgery Why: Your appointment is on Tuesday, 10/24/2020 at 3:45 PM.  Please arrive 30 minutes early for a chest x-ray to be performed by [facility] located on the first floor of the same building Contact information: [address] [place] [employer] [address] [phone]               Signed: Antony Odea, PA-C 10/07/2020, 10:34 AM

## 2020-10-06 NOTE — Brief Op Note (Addendum)
10/06/2020  9:47 AM  PATIENT:  Elizabeth Beasley  40 y.o. female  PRE-OPERATIVE DIAGNOSIS:  ANTERIOR MEDIASTINAL ADENOPATHY  POST-OPERATIVE DIAGNOSIS:  ANTERIOR MEDIASTINAL ADENOPATHY  PROCEDURE:  Xi ROBOTIC ASSISTED LEFT THORACOSCOPY ANTERIOR MEDIASTINAL LYMPH NODE BIOPSY  SURGEON:  Surgeon(s) and Role:    * [name], MD - Primary  PHYSICIAN ASSISTANT:  Nicholes Rough, PA-C   ANESTHESIA:   general  EBL:  10 mL   BLOOD ADMINISTERED:none  DRAINS:  BLAKE DRAIN  19 F  LOCAL MEDICATIONS USED:  BUPIVICAINE   SPECIMEN:  Source of Specimen:  ANTERIOR LYMPH NODES  DISPOSITION OF SPECIMEN:  PATHOLOGY  COUNTS:  YES  DICTATION: .Dragon Dictation  PLAN OF CARE: Admit for overnight observation  PATIENT DISPOSITION:  PACU - hemodynamically stable.   Delay start of Pharmacological VTE agent (>24hrs) due to surgical blood loss or risk of bleeding: no

## 2020-10-06 NOTE — Plan of Care (Signed)

## 2020-10-06 NOTE — Op Note (Signed)
NAME: Elizabeth, Beasley MEDICAL RECORD IO:[id number] ACCOUNT [id number] DATE OF BIRTH:1980/12/21 FACILITY: [facility] LOCATION: [facility] PHYSICIAN:Ebone Alcivar Chaya Jan, MD  OPERATIVE REPORT  DATE OF PROCEDURE:  10/06/2020  PREOPERATIVE DIAGNOSIS:  Anterior mediastinal adenopathy with history of Hodgkin's disease, post-treatment.  POSTOPERATIVE DIAGNOSIS:  Anterior mediastinal adenopathy with history of Hodgkin disease, post-treatment.  PROCEDURE: 1.  Xi robotic-assisted left thoracoscopy. 2.  Anterior mediastinal lymph node biopsy.  SURGEON:  Modesto Charon, MD  ASSISTANT:  Nicholes Rough, PA  ANESTHESIA:  General.  FINDINGS:  Multiple enlarged anterior mediastinal lymph nodes.  Specimen sent for permanent pathology.  CLINICAL NOTE:  Elizabeth Beasley is a 40 year old woman with a history of stage III nodular sclerosing Hodgkin's lymphoma.  She has undergone treatment for that.  She recently had a repeat PET/CT, which showed some anterior mediastinal adenopathy with  metabolic activity.  It was unclear if this represented residual treated disease or if there was active disease that would require additional therapy.  She was referred for lymph node biopsy.  The patient was offered a robotic approach via a left VATS.   The indications, risks, benefits, and alternatives were discussed in detail with the patient.  She understood and accepted the risks and agreed to proceed.  OPERATIVE NOTE:  Elizabeth Beasley was brought to the operating room on 10/06/2020.  She had induction of general anesthesia and was intubated with a double lumen endotracheal tube.  Intravenous antibiotics were administered.  A Foley catheter was placed.   Sequential compression devices were placed on the calves for DVT prophylaxis.  Her left chest was elevated and left arm tucked to the patient's side.  The left chest and upper abdomen were prepped and draped in the usual sterile fashion.  Single lung  ventilation of the  right lung was initiated and was tolerated well throughout the procedure.  A timeout was performed.  A solution containing 20 mL of liposomal bupivacaine and 30 mL of 0.5% bupivacaine was used for local anesthesia at the incision sites.  An incision was made in approximately the fifth interspace in the anterior axillary line.   The chest was entered bluntly using a hemostat.  An 8 mm port was inserted.  The thoracoscope was advanced into the chest.  There was good isolation of the left lung.  Carbon dioxide was insufflated per protocol.  Additional 8 mm ports were placed in the  third and ninth interspaces for the instruments.  The robot was brought to the operative field and deployed.  The camera arm was docked.  Camera was inserted.  Targeting was performed.  The remaining ports were docked.  The robotic instruments were  inserted with robotic visualization and a single sponge was inserted.  The phrenic nerve was identified and no cautery was used in the vicinity of the phrenic nerve.  There was a large mass of anterior mediastinal lymph nodes visible under the  mediastinal pleura.  Mediastinal pleura overlying this was opened with bipolar cautery and a representative group of the lymph nodes was removed.  There were multiple nodes including some relatively small nodes as well as a pair of markedly enlarged lymph nodes.  The nodes was removed with minimal bleeding.  After the nodal packet was resected, the instrument was removed from the inferior port and an 8 mm endoscopic retrieval bag was inserted.  The specimen was placed into the bag along with the sponge and  the bag then was removed through the incision.  Final inspection was made and there  was good hemostasis at the resection site.  The camera and remaining robotic instruments were removed and the robot was undocked.  The chest was copiously irrigated with  warm saline.  A 19-French Blake drain was placed through the ninth interspace incision and  directed to the apex.  It was secured to skin with a #1 silk suture.  Dual lung ventilation was resumed.  The remaining 2 incisions were closed with 0 Vicryl  fascial suture and 3-0 Vicryl subcuticular suture.  Dermabond was applied.  The Blake drain was placed to Pleur-evac on waterseal.  The patient then was extubated in the operating room and taken to the postanesthetic care unit in good condition.  HN/NUANCE  D:10/06/2020 T:10/06/2020 JOB:[id number]

## 2020-10-06 NOTE — Interval H&P Note (Signed)
History and Physical Interval Note:  10/06/2020 7:04 AM  Elizabeth Beasley  has presented today for surgery, with the diagnosis of ANTERIOR MEDIASTINAL ADENOPATHY.  The various methods of treatment have been discussed with the patient and family. After consideration of risks, benefits and other options for treatment, the patient has consented to  Procedure(s): XI ROBOTIC ASSISTED THORASCOPY (Left) MEDIASTINAL LYMPH NODE BIOPSY (Left) as a surgical intervention.  The patient's history has been reviewed, patient examined, no change in status, stable for surgery.  I have reviewed the patient's chart and labs.  Questions were answered to the patient's satisfaction.     [name]

## 2020-10-06 NOTE — Anesthesia Procedure Notes (Signed)
Procedure Name: Intubation Date/Time: 10/06/2020 7:48 AM Performed by: Dorthea Cove, CRNA Pre-anesthesia Checklist: Patient identified, Emergency Drugs available, Suction available and Patient being monitored Patient Re-evaluated:Patient Re-evaluated prior to induction Oxygen Delivery Method: Circle system utilized Preoxygenation: Pre-oxygenation with 100% oxygen Induction Type: IV induction Ventilation: Mask ventilation without difficulty Laryngoscope Size: Mac and 3 Grade View: Grade II Tube type: Oral Endobronchial tube: Double lumen EBT and Left and 37 Fr Number of attempts: 1 Airway Equipment and Method: Stylet and Oral airway (checked position with bronchoscope) Placement Confirmation: ETT inserted through vocal cords under direct vision,  positive ETCO2 and breath sounds checked- equal and bilateral Secured at: 29 cm Tube secured with: Tape Dental Injury: Teeth and Oropharynx as per pre-operative assessment  Comments: CRNA attempted to pass DLT x2 with MAC 3. Unable to pass. MD successful with MAC 3 grade 2 view.

## 2020-10-07 ENCOUNTER — Inpatient Hospital Stay (HOSPITAL_COMMUNITY): Payer: 59

## 2020-10-07 ENCOUNTER — Other Ambulatory Visit (HOSPITAL_COMMUNITY): Payer: Self-pay | Admitting: Physician Assistant

## 2020-10-07 ENCOUNTER — Encounter (HOSPITAL_COMMUNITY): Payer: Self-pay | Admitting: Thoracic Surgery (Cardiothoracic Vascular Surgery)

## 2020-10-07 LAB — BLOOD GAS, ARTERIAL
Acid-base deficit: 0.2 mmol/L (ref 0.0–2.0)
Bicarbonate: 23.7 mmol/L (ref 20.0–28.0)
FIO2: 21
O2 Saturation: 96.6 %
Patient temperature: 37
pCO2 arterial: 37.6 mmHg (ref 32.0–48.0)
pH, Arterial: 7.417 (ref 7.350–7.450)
pO2, Arterial: 82.5 mmHg — ABNORMAL LOW (ref 83.0–108.0)

## 2020-10-07 LAB — CBC
HCT: 38.3 % (ref 36.0–46.0)
Hemoglobin: 13.1 g/dL (ref 12.0–15.0)
MCH: 33 pg (ref 26.0–34.0)
MCHC: 34.2 g/dL (ref 30.0–36.0)
MCV: 96.5 fL (ref 80.0–100.0)
Platelets: 272 10*3/uL (ref 150–400)
RBC: 3.97 MIL/uL (ref 3.87–5.11)
RDW: 12 % (ref 11.5–15.5)
WBC: 14.9 10*3/uL — ABNORMAL HIGH (ref 4.0–10.5)
nRBC: 0 % (ref 0.0–0.2)

## 2020-10-07 LAB — BASIC METABOLIC PANEL
Anion gap: 8 (ref 5–15)
BUN: 8 mg/dL (ref 6–20)
CO2: 22 mmol/L (ref 22–32)
Calcium: 8.4 mg/dL — ABNORMAL LOW (ref 8.9–10.3)
Chloride: 107 mmol/L (ref 98–111)
Creatinine, Ser: 0.85 mg/dL (ref 0.44–1.00)
GFR, Estimated: >60 mL/min (ref >60)
Glucose, Bld: 135 mg/dL — ABNORMAL HIGH (ref 70–99)
Potassium: 3.5 mmol/L (ref 3.5–5.1)
Sodium: 137 mmol/L (ref 135–145)

## 2020-10-07 LAB — GLUCOSE, CAPILLARY
Glucose-Capillary: 106 mg/dL — ABNORMAL HIGH (ref 70–99)
Glucose-Capillary: 85 mg/dL (ref 70–99)

## 2020-10-07 MED ORDER — OXYCODONE-ACETAMINOPHEN 5-325 MG PO TABS: 1.0000 | ORAL_TABLET | ORAL | 0 refills | Status: DC | PRN

## 2020-10-07 MED ORDER — ACETAMINOPHEN 325 MG PO TABS: 650.0000 mg | ORAL_TABLET | ORAL | Status: DC | PRN

## 2020-10-07 MED ORDER — CHLORHEXIDINE GLUCONATE CLOTH 2 % EX PADS
6.0000 | MEDICATED_PAD | Freq: Every day | CUTANEOUS | Status: DC
  Administered 2020-10-07: 6 via TOPICAL

## 2020-10-07 NOTE — Discharge Instructions (Signed)

## 2020-10-07 NOTE — Progress Notes (Signed)
Patient's HR stays on 90's -100's. Patient was asymptomatic while ambulation. PA Myron notified HR and patient is okay to d/c home. Removed PIV access & received discharge instructions. Patient understood it well. HS [employer]

## 2020-10-07 NOTE — Progress Notes (Signed)
1 Day Post-Op Procedure(s) (LRB): XI ROBOTIC ASSISTED THORASCOPY (Left) MEDIASTINAL LYMPH NODE BIOPSY (Left) INTERCOSTAL NERVE BLOCK Subjective: Some discomfort earlier, no pain presently  Objective: Vital signs in last 24 hours: Temp:  [97.7 F (36.5 C)-98.7 F (37.1 C)] 98.1 F (36.7 C) (01/22 0800) Pulse Rate:  [69-101] 101 (01/22 0800) Cardiac Rhythm: Normal sinus rhythm (01/22 0839) Resp:  [12-22] 20 (01/22 0800) BP: (97-113)/(51-87) 110/62 (01/22 0800) SpO2:  [96 %-100 %] 98 % (01/22 0800)  Hemodynamic parameters for last 24 hours:    Intake/Output from previous day: 01/21 0701 - 01/22 0700 In: 2594.5 [P.O.:120; I.V.:2474.5] Out: 4496 [Urine:3050; Blood:10; Chest Tube:70] Intake/Output this shift: No intake/output data recorded.  General appearance: alert, cooperative and no distress Neurologic: intact Heart: tachy regular Lungs: clear to auscultation bilaterally no air leak, mininal serous drainage from chest tube  Lab Results: Recent Labs    10/04/20 1341 10/07/20 0128  WBC 10.0 14.9*  HGB 14.6 13.1  HCT 40.8 38.3  PLT 278 272   BMET:  Recent Labs    10/04/20 1341 10/07/20 0128  NA 139 137  K 3.6 3.5  CL 106 107  CO2 22 22  GLUCOSE 92 135*  BUN 11 8  CREATININE 0.85 0.85  CALCIUM 9.2 8.4*    PT/INR:  Recent Labs    10/04/20 1341  LABPROT 12.8  INR 1.0   ABG    Component Value Date/Time   PHART 7.417 10/07/2020 0609   HCO3 23.7 10/07/2020 0609   ACIDBASEDEF 0.2 10/07/2020 0609   O2SAT 96.6 10/07/2020 0609   CBG (last 3)  Recent Labs    10/06/20 1631 10/06/20 2116 10/07/20 0615  GLUCAP 138* 133* 106*    Assessment/Plan: S/P Procedure(s) (LRB): XI ROBOTIC ASSISTED THORASCOPY (Left) MEDIASTINAL LYMPH NODE BIOPSY (Left) INTERCOSTAL NERVE BLOCK -Overall looks good this AM Minimal drainage from tube- will dc  Sinus tachycardia this AM- rate slows with deep breathing  Monitor Will see how she does with ambulation, if HR Ok  will dc later today   LOS: 1 day    [name] 10/07/2020

## 2020-10-07 NOTE — Progress Notes (Signed)
Patient denied any pain only little discomfort on chest tube area. HR went up to 130's, ambulated on the hall way. HR down to low 100's when she stopped. Will paging PA Myron regarding this matter. Patient voided well. HS [employer]

## 2020-10-09 ENCOUNTER — Other Ambulatory Visit: Payer: Self-pay | Admitting: *Deleted

## 2020-10-09 ENCOUNTER — Encounter: Payer: Self-pay | Admitting: *Deleted

## 2020-10-09 MED FILL — OXYCODONE-APAP 5-325MG: 5-325 | 3 days supply | Qty: 15 | Fill #0

## 2020-10-09 NOTE — Patient Outreach (Signed)
[facility]) Care Management  10/09/2020  Elizabeth Beasley [birth year] [id number]   Transition of care call/case closure   Referral received:10/06/20 Initial outreach:10/09/20 Insurance: U[employer]    Subjective: Initial successful telephone call to patient's preferred number in order to complete transition of care assessment; 2 HIPAA identifiers verified. Explained purpose of call and completed transition of care assessment.  Elizabeth Beasley states that she is doing pretty good, she  denies post-operative problems, says surgical incisions are unremarkable, sutures in place at one site and dermabond at other site,  states surgical pain well managed with prescribed medications. She  tolerating diet, denies bowel or bladder problems, no bowel movement yet has taken miralax today.   Spouse/patient mother are assisting with her  recovery.  She reports tolerating taking shower. Reinforced mobility in home, continued use of incentive spirometry, she reports having cough at times with clear phlegm.   Reviewed accessing the following [place]  Benefits : She denies any ongoing health issues and says she does not need a referral to one of the [place] chronic disease management programs.  She does have the hospital indemnity plan and has contact number to file a claim if necessary.  She  uses a [facility] outpatient pharmacy at [facility].   Objective:  Elizabeth Beasley  was hospitalized at [facility] 1/21-1/22/22 for  Robotic assisted anterior mediastinal lymph node dissection  Comorbidities include: Iron deficiency anemia due to chronic blood loss, cervical lymphadenopathy.  He  was discharged to home on 10/07/20  without the need for home health services or DME.   Assessment:  Patient voices good understanding of all discharge instructions.  See transition of care flowsheet for assessment details.   Plan:  Reviewed hospital discharge diagnosis of Robotic Assisted  anterior mediastinal lymph node dissection  and discharge treatment plan using hospital discharge instructions, assessing medication adherence, reviewing problems requiring provider notification, and discussing the importance of follow up with surgeon, primary care provider and/or specialists as directed.  Reviewed [place] healthy lifestyle program information to receive discounted premium for  2023   Step 1: Get  your annual physical  Step 2: Complete your health assessment  Step 3:Identify your current health status and complete the corresponding action step between September 16, 2020 and May 17, 2021.     No ongoing care management needs identified so will close case to [place] Management services and route successful outreach letter with [place] Management pamphlet and 24 Hour Nurse Line Magnet to [place] Management clinical pool to be mailed to patient's home address.  Thanked patient for their services to [facility].  Joylene Draft, RN, BSN  [place] Management Coordinator  [phone]- Mobile [phone]- Toll Free Main Office

## 2020-10-10 ENCOUNTER — Telehealth: Payer: Self-pay | Admitting: Thoracic Surgery (Cardiothoracic Vascular Surgery)

## 2020-10-10 LAB — SURGICAL PATHOLOGY

## 2020-10-10 NOTE — Telephone Encounter (Signed)
I called Mrs. Hohmann to update her on path results.  FINAL MICROSCOPIC DIAGNOSIS:   A. LYMPH NODE, ANTERIOR MEDIASTINAL, EXCISION:  - Fibrosis and scattered small lymphocytes, see comment.  - No evidence of Hodgkin lymphoma.   Revonda Standard Roxan Hockey, MD [facility] [phone]

## 2020-10-13 ENCOUNTER — Inpatient Hospital Stay: Payer: 59 | Attending: Hematology

## 2020-10-13 ENCOUNTER — Other Ambulatory Visit: Payer: Self-pay

## 2020-10-13 DIAGNOSIS — Z95828 Presence of other vascular implants and grafts: Secondary | ICD-10-CM

## 2020-10-13 DIAGNOSIS — D500 Iron deficiency anemia secondary to blood loss (chronic): Secondary | ICD-10-CM

## 2020-10-13 DIAGNOSIS — Z7189 Other specified counseling: Secondary | ICD-10-CM

## 2020-10-13 DIAGNOSIS — Z452 Encounter for adjustment and management of vascular access device: Secondary | ICD-10-CM | POA: Insufficient documentation

## 2020-10-13 DIAGNOSIS — C8111 Nodular sclerosis classical Hodgkin lymphoma, lymph nodes of head, face, and neck: Secondary | ICD-10-CM | POA: Insufficient documentation

## 2020-10-13 MED ORDER — HEPARIN SOD (PORK) LOCK FLUSH 100 UNIT/ML IV SOLN
500.0000 [IU] | Freq: Once | INTRAVENOUS | Status: AC
  Administered 2020-10-13: 500 [IU]
  Filled 2020-10-13: qty 5

## 2020-10-13 MED ORDER — SODIUM CHLORIDE 0.9% FLUSH
10.0000 mL | Freq: Once | INTRAVENOUS | Status: AC
  Administered 2020-10-13: 10 mL
  Filled 2020-10-13: qty 10

## 2020-10-13 NOTE — Patient Instructions (Signed)

## 2020-10-17 ENCOUNTER — Other Ambulatory Visit: Payer: Self-pay

## 2020-10-17 ENCOUNTER — Encounter (INDEPENDENT_AMBULATORY_CARE_PROVIDER_SITE_OTHER): Payer: Self-pay

## 2020-10-17 ENCOUNTER — Ambulatory Visit
Admission: RE | Admit: 2020-10-17 | Discharge: 2020-10-17 | Disposition: A | Discharge status: Home / Self Care | Payer: 59 | Source: Ambulatory Visit | Attending: Family Medicine | Admitting: Family Medicine

## 2020-10-17 DIAGNOSIS — T8332XA Displacement of intrauterine contraceptive device, initial encounter: Secondary | ICD-10-CM | POA: Insufficient documentation

## 2020-10-17 DIAGNOSIS — Z975 Presence of (intrauterine) contraceptive device: Secondary | ICD-10-CM | POA: Diagnosis not present

## 2020-10-17 DIAGNOSIS — Z4802 Encounter for removal of sutures: Secondary | ICD-10-CM

## 2020-10-23 ENCOUNTER — Other Ambulatory Visit: Payer: Self-pay | Admitting: Thoracic Surgery (Cardiothoracic Vascular Surgery)

## 2020-10-23 DIAGNOSIS — Z9889 Other specified postprocedural states: Secondary | ICD-10-CM

## 2020-10-24 ENCOUNTER — Ambulatory Visit (INDEPENDENT_AMBULATORY_CARE_PROVIDER_SITE_OTHER): Payer: Self-pay | Admitting: Thoracic Surgery (Cardiothoracic Vascular Surgery)

## 2020-10-24 ENCOUNTER — Other Ambulatory Visit: Payer: Self-pay

## 2020-10-24 ENCOUNTER — Telehealth: Payer: Self-pay | Admitting: Hematology

## 2020-10-24 ENCOUNTER — Encounter: Payer: Self-pay | Admitting: Thoracic Surgery (Cardiothoracic Vascular Surgery)

## 2020-10-24 ENCOUNTER — Ambulatory Visit
Admission: RE | Admit: 2020-10-24 | Discharge: 2020-10-24 | Disposition: A | Discharge status: Home / Self Care | Payer: 59 | Source: Ambulatory Visit | Attending: Thoracic Surgery (Cardiothoracic Vascular Surgery) | Admitting: Thoracic Surgery (Cardiothoracic Vascular Surgery)

## 2020-10-24 ENCOUNTER — Telehealth: Payer: Self-pay

## 2020-10-24 VITALS — BP 125/76 | HR 97 | Temp 98.1°F | Resp 20 | Ht 64.0 in | Wt 215.0 lb

## 2020-10-24 DIAGNOSIS — Z9889 Other specified postprocedural states: Secondary | ICD-10-CM

## 2020-10-24 DIAGNOSIS — R918 Other nonspecific abnormal finding of lung field: Secondary | ICD-10-CM | POA: Diagnosis not present

## 2020-10-24 NOTE — Telephone Encounter (Signed)
Called patient regarding 02/18 appointment, left a voicemail.

## 2020-10-24 NOTE — Telephone Encounter (Signed)
[facility] contacted the office about stat chest xray report.  Dr. Roxan Hockey to see patient in office and discuss results.

## 2020-10-24 NOTE — Progress Notes (Signed)
[address]       [place] [address]             [phone]     HPI: Elizabeth Beasley returns for scheduled postoperative follow-up visit  Elizabeth Beasley is a 40 year old woman with a history of stage III nodular sclerosing Hodgkin's lymphoma.  She recently had a PET/CT for posttreatment follow-up.  There was some anterior mediastinal adenopathy with associated metabolic activity.  There was a right paratracheal node that was not particularly active.  It was unclear if this was residual treated disease or if there was active disease that would require additional therapy.  I did a Robotic assisted left thoracoscopy for biopsy of the anterior mediastinal nodes on 10/06/2020.  She did well postoperatively and went home on day 1.  She has a little incisional soreness but is not taking any medication for that.  She started back to work from home after about a week.  She does notice that she gets a little winded when she walks up a flight of stairs.  Past Medical History:  Diagnosis Date  . Anemia   . Bartholin gland cyst   . Headache   . Hodgkin's lymphoma ([place])   . Hodgkin's lymphoma, nodular sclerosis ([place]) 06/08/2019  . Hx of seasonal allergies   . Lymphadenopathy, cervical 05/21/2019  . PONV (postoperative nausea and vomiting)   . 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 b complex vitamins capsule Take 1 capsule by mouth daily.    . cholecalciferol (VITAMIN D3) 25 MCG (1000 UT) tablet Take 2,000 Units by mouth daily.    . fluticasone (FLONASE) 50 MCG/ACT nasal spray Place 2 sprays into both nostrils daily as needed for allergies or rhinitis.    Marland Kitchen levonorgestrel (MIRENA) 20 MCG/24HR IUD 1 each by Intrauterine route once.    . loratadine (CLARITIN) 10 MG tablet Take 10 mg by mouth daily.    . Polyvinyl Alcohol-Povidone (REFRESH OP) Place 1 drop into both eyes daily as  needed (dry eyes).     No current facility-administered medications for this visit.    Physical Exam BP 125/76 (BP Location: Right Arm, Patient Position: Sitting, Cuff Size: Large)   Pulse 97   Temp 98.1 F (36.7 C) (Skin)   Resp 20   Ht 5\' 4"  (1.626 m)   Wt 215 lb (97.5 kg)   LMP 10/02/2020   SpO2 97% Comment: RA  BMI 36.51 kg/m  40 year old woman in no acute distress Alert and oriented x3 with no focal deficits Lungs diminished at left base but otherwise clear Incisions well-healed Cardiac regular rate and rhythm  Diagnostic Tests: CHEST - 2 VIEW  COMPARISON:  October 07, 2020  FINDINGS: Port-A-Cath tip is in the superior vena cava. No pneumothorax. Focal airspace opacity in the inferior lingular region noted. Lungs elsewhere clear. Heart size and pulmonary vascularity are normal. No adenopathy. No bone lesions.  IMPRESSION: Focal airspace opacity inferior lingular region concerning for focal area of pneumonia. Lungs elsewhere clear. Heart size normal. Port-A-Cath tip in superior vena cava. No pneumothorax.  These results will be called to the ordering clinician or representative by the Radiologist Assistant, and communication documented in the PACS or [employer].   Electronically Signed   By: Lowella Grip III M.D.   On: 10/24/2020 15:47 I personally reviewed the chest x-ray images.  There  is some elevation of the left hemidiaphragm.  I suspect she does have some atelectasis associated with that.  There is no clinical evidence of pneumonia.  Impression: Elizabeth Beasley is a 40 year old woman with history of stage III Hodgkin's lymphoma who had residual anterior mediastinal adenopathy that was metabolically active on PET/CT.  I did a robotic left VATS for biopsy of the anterior mediastinal nodes on 10/06/2020.  She did well and went home on day 1.  She is doing well from a pain standpoint.  She does have an occasional pain but is not requiring any  pain medication.  Her left hemidiaphragm is slightly elevated.  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  The nerve was clearly identified to suspect there was just some traction on it with the removal of the nodes.  This would explain her breathlessness when she walks up a flight of stairs.  It should improve with time.  Plan: Follow-up with Dr. Irene Limbo as scheduled Return in 6 months with PA lateral chest x-ray  [name], MD [facility] [phone]

## 2020-11-02 NOTE — Progress Notes (Signed)
HEMATOLOGY/ONCOLOGY CLINIC NOTE  Date of Service: 11/02/20   Patient Care Team: [name], MD as PCP - General (Family Medicine)  CHIEF COMPLAINTS/PURPOSE OF CONSULTATION:   Continue management of classical hodgkins lymphoma   CURRENT TREATMENT: Active surveillance   HISTORY OF PRESENTING ILLNESS:   Elizabeth Beasley is a wonderful 40 y.o. female who has been referred to [place] by Dr Madilyn Fireman for evaluation and management of microcytic anemia/cervical lymphadenopathy.The pt reports that she has a history of iron deficiency but has never had a transfusion. Pt has had two pregnancies and has two children, ages 61 and 39. There was a concern for anemia with her second pregnancy so she was given an erythropoietin shot. She has had a copper IUD for about 2 years and her menstrual cycle has been slightly longer since getting the IUD. She has been taking 28 mg TID PO iron since June. Pt has no known medication allergies and has had a previous removal of Bartholin's gland cyst. Pt has had no known contact with chemicals either through work or hobbies. She has no history of asthma or heavy smoke/aerosol exposure.   Pt did not notice any cervical lymphadenopathy in June. In late July, early August she began to experience extreme fatigue and could begin to feel small lumps in her neck. Her fatigue is improved with taking her PO Iron TID. She has experienced a couple of episodes of night sweats, with the latest occurring in late August. Pt has also had <10 lbs loss in 2 months. She has not had any rashes but is experiencing itching in her palms, broader itching after hot showers.   Pt is currently working from home. She has had her seasonal flu shot. Pt is not planning on having more children and is not concerned about fertility. Pt's husband would like to take a drive to the [facility] next month. Pt would not want any blood product transfusions, as she is a Jehovah's Witness. She  is comfortable with IV iron infusions.   INTERVAL HISTORY:  Elizabeth Beasley is a 40 y.o. female here for evaluation and management of Hodgkins Lymphoma. Her final chemotherapy treatment was on 11/19/2019. The patient's last visit with [place] was on 09/01/2020. The pt reports that she is doing well overall.  The pt had removal of anterior mediastinal adenopathy that revealed no evidence of Hodgkin Lymphoma.   The pt reports no problems following her surgery outside of SOB. This is since improving and not bothering her way of life. The pt notes that she has ringing in both her ears, which is new to the pt. She denies loud music or headphone usage. This is constant, high-pitched ringing that occurred prior to the surgery. This does not keep her from sleeping or affect her hearing.  The pt has received her COVID vaccines and booster, flu vaccine, Prevnar, but denies getting the Pneumovax.   Lab results today 11/03/2020 of CBC w/diff and CMP is as follows: all values are WNL except for AST of 12, Total Bilirubin of 1.4. 11/03/2020 Sedimentation rate in progress.  On review of systems, pt reports ringing in ear, improving SOB and denies fevers, chills, night sweats, leg swelling, abdominal pain, back pain, and any other symptoms.  MEDICAL HISTORY:  Past Medical History:  Diagnosis Date  . Anemia   . Bartholin gland cyst   . Headache   . Hodgkin's lymphoma ([place])   . 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 Hx  of seasonal allergies   . Lymphadenopathy, cervical 05/21/2019  . PONV (postoperative nausea and vomiting)   . 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 CESAREAN SECTION  2010  . INTERCOSTAL NERVE BLOCK  10/06/2020   Procedure: INTERCOSTAL NERVE BLOCK;  Surgeon: [name], MD;  Location: [place];  Service: Thoracic;;  . LYMPH NODE BIOPSY Left 10/06/2020    Procedure: MEDIASTINAL LYMPH NODE BIOPSY;  Surgeon: [name], MD;  Location: [facility];  Service: Thoracic;  Laterality: Left;  Marland Kitchen MASS EXCISION Right 05/21/2019   Procedure: EXCISION OF RIGHT DEEP NECK LYMPH NODE;  Surgeon: Fanny Skates, MD;  Location: [place];  Service: General;  Laterality: Right;  . PORTACATH PLACEMENT Right 06/08/2019   Procedure: INSERTION PORT-A-CATH WITH ULTRASOUND GUIDANCE;  Surgeon: Fanny Skates, MD;  Location: [facility];  Service: General;  Laterality: Right;  . URETHRAL CYST REMOVAL N/A 08/30/2016   Procedure: BARTHOLIN'S GLAND CYST EXCISION;  Surgeon: Ardis Hughs, MD;  Location: [facility];  Service: Urology;  Laterality: N/A;  . v back     2012    SOCIAL HISTORY: Social History   Socioeconomic History  . Marital status: Married    Spouse name: Montine Circle  . Number of children: 2  . Years of education: Assoc  . Highest education level: Not on file  Occupational History  . Occupation: CMA    Employer: [employer]  Tobacco Use  . Smoking status: Never Smoker  . Smokeless tobacco: Never Used  Vaping Use  . Vaping Use: Never used  Substance and Sexual Activity  . Alcohol use: Not Currently    Comment: ocassional  . Drug use: No  . Sexual activity: Yes    Partners: Male    Birth control/protection: I.U.D.  Other Topics Concern  . Not on file  Social History Narrative   Regular exercise. No regular caffeine.   Social Determinants of Health   Financial Resource Strain: Not on file  Food Insecurity: Not on file  Transportation Needs: Not on file  Physical Activity: Not on file  Stress: Not on file  Social Connections: Not on file  Intimate Partner Violence: Not on file    FAMILY HISTORY: Family History  Problem Relation Age of Onset  . Colon cancer Father 78  . Colon polyps Mother   . Hypertension Mother   . Diabetes Paternal Grandmother   . Stroke Paternal Grandmother   . Alcohol abuse Paternal  Uncle   . Colon cancer Paternal Aunt 18  . Esophageal cancer Neg Hx   . Rectal cancer Neg Hx   . Stomach cancer Neg Hx    On PMHx the pt reports C-section, Bartholin's Gland Cyst removal. On Social Hx the pt reports non smoker, no alcohol use. On Family Hx the pt reports father passed from colon cancer, mother has colon polyps.  ALLERGIES:  has No Known Allergies.  MEDICATIONS:  Current Outpatient Medications  Medication Sig Dispense Refill  . b complex vitamins capsule Take 1 capsule by mouth daily.    . cholecalciferol (VITAMIN D3) 25 MCG (1000 UT) tablet Take 2,000 Units by mouth daily.    . fluticasone (FLONASE) 50 MCG/ACT nasal spray Place 2 sprays into both nostrils daily as needed for allergies or rhinitis.    Marland Kitchen levonorgestrel (MIRENA) 20 MCG/24HR IUD 1 each by Intrauterine route once.    Marland Kitchen  loratadine (CLARITIN) 10 MG tablet Take 10 mg by mouth daily.    . Polyvinyl Alcohol-Povidone (REFRESH OP) Place 1 drop into both eyes daily as needed (dry eyes).     No current facility-administered medications for this visit.    REVIEW OF SYSTEMS:   10 Point review of Systems was done is negative except as noted above.  PHYSICAL EXAMINATION: ECOG FS:1 - Symptomatic but completely ambulatory  There were no vitals filed for this visit. Wt Readings from Last 3 Encounters:  10/24/20 215 lb (97.5 kg)  10/06/20 221 lb (100.2 kg)  10/04/20 221 lb 8 oz (100.5 kg)   There is no height or weight on file to calculate BMI.    GENERAL:alert, in no acute distress and comfortable SKIN: no acute rashes, no significant lesions EYES: conjunctiva are pink and non-injected, sclera anicteric OROPHARYNX: MMM, no exudates, no oropharyngeal erythema or ulceration NECK: supple, no JVD LYMPH:  no palpable lymphadenopathy in the cervical, axillary or inguinal regions LUNGS: clear to auscultation b/l with normal respiratory effort HEART: regular rate & rhythm ABDOMEN:  normoactive bowel sounds , non  tender, not distended. Extremity: no pedal edema PSYCH: alert & oriented x 3 with fluent speech NEURO: no focal motor/sensory deficits  LABORATORY DATA:  I have reviewed the data as listed  - 05/14/2019 Fe+TIBC+Fer is as follows: Iron at 11, TIBC at 258, % Sat at 4, Ferritin at 50. -Discussed 05/21/2019 Lymph Node Bx ([id number]) which revealed " CLASSIC HODGKIN LYMPHOMA."  - 05/19/2019 CT C/A/P ([id number]) which revealed  "1. Enlarged mediastinal, bilateral hilar and lower cervical lymph nodes. Primary considerations include lymphoma, sarcoid and less likely metastatic disease. Normal spleen. No evidence of enlarged lymph nodes within the abdomen or pelvis. 2. Low lying IUD within the LOWER uterine segment and may extend to the cervix."  - 05/17/2019 CT Soft Tissue Neck w/contrast ([id number]) which revealed "Bulky right greater than left cervical and mediastinal lymphadenopathy concerning for lymphoma."  . CBC Latest Ref Rng & Units 10/07/2020 10/04/2020 09/01/2020  WBC 4.0 - 10.5 K/uL 14.9(H) 10.0 9.0  Hemoglobin 12.0 - 15.0 g/dL 13.1 14.6 14.4  Hematocrit 36.0 - 46.0 % 38.3 40.8 41.4  Platelets 150 - 400 K/uL 272 278 248    . CMP Latest Ref Rng & Units 10/07/2020 10/04/2020 09/01/2020  Glucose 70 - 99 mg/dL 135(H) 92 104(H)  BUN 6 - 20 mg/dL 8 11 9   Creatinine 0.44 - 1.00 mg/dL 0.85 0.85 0.81  Sodium 135 - 145 mmol/L 137 139 139  Potassium 3.5 - 5.1 mmol/L 3.5 3.6 3.8  Chloride 98 - 111 mmol/L 107 106 107  CO2 22 - 32 mmol/L 22 22 23   Calcium 8.9 - 10.3 mg/dL 8.4(L) 9.2 9.3  Total Protein 6.5 - 8.1 g/dL - 7.1 7.6  Total Bilirubin 0.3 - 1.2 mg/dL - 1.2 1.6(H)  Alkaline Phos 38 - 126 U/L - 57 59  AST 15 - 41 U/L - 17 16  ALT 0 - 44 U/L - 17 22   . Lab Results  Component Value Date   IRON 79 07/09/2019   TIBC 221 (L) 07/09/2019   IRONPCTSAT 36 07/09/2019   (Iron and TIBC)  Lab Results  Component Value Date   FERRITIN 590 (H) 07/09/2019  08/13/2019 NM PET Image Restag  (PS) Skull Base To Thigh (Accession [id number])   05/21/2019 ([id number]) Lymph Node Biopsy    RADIOGRAPHIC STUDIES: I have personally reviewed the radiological images as listed and agreed with the findings in  the report. DG Chest 2 View  Result Date: 10/24/2020 CLINICAL DATA:  Shortness of breath. Previous mediastinal lymph node biopsy EXAM: CHEST - 2 VIEW COMPARISON:  October 07, 2020 FINDINGS: Port-A-Cath tip is in the superior vena cava. No pneumothorax. Focal airspace opacity in the inferior lingular region noted. Lungs elsewhere clear. Heart size and pulmonary vascularity are normal. No adenopathy. No bone lesions. IMPRESSION: Focal airspace opacity inferior lingular region concerning for focal area of pneumonia. Lungs elsewhere clear. Heart size normal. Port-A-Cath tip in superior vena cava. No pneumothorax. These results will be called to the ordering clinician or representative by the Radiologist Assistant, and communication documented in the PACS or [employer]. Electronically Signed   By: Lowella Grip III M.D.   On: 10/24/2020 15:47   DG Chest 2 View  Result Date: 10/04/2020 CLINICAL DATA:  Upcoming mediastinal lymph node biopsy EXAM: CHEST - 2 VIEW COMPARISON:  Chest x-ray 06/08/2019, CT 05/19/2019, PET CT 08/25/2020 FINDINGS: Right-sided central venous port tip over the SVC. No acute airspace disease or effusion. Normal heart size. No pneumothorax. Right low paratracheal opacity likely corresponding to nodes demonstrated on PET CT. IMPRESSION: No acute cardiopulmonary abnormality. Low right paratracheal opacity likely corresponding to lymph nodes on comparison PET CT Electronically Signed   By: Donavan Foil M.D.   On: 10/04/2020 23:18   US PELVIS (TRANSABDOMINAL ONLY)  Result Date: 10/17/2020 CLINICAL DATA:  Lost IUD threads EXAM: TRANSABDOMINAL ULTRASOUND OF PELVIS TECHNIQUE: Transabdominal ultrasound examination of the pelvis was performed including evaluation of the uterus,  ovaries, adnexal regions, and pelvic cul-de-sac. COMPARISON:  No comparison. FINDINGS: Uterus Measurements: 8.5 x 4.6 x 5.8 cm = volume: 120 mL. No fibroids or other mass visualized. Endometrium Thickness: IUD visualized in the endometrial canal. No focal abnormality visualized. Right ovary Measurements: 2.7 x 2.2 x 1.2 cm = volume: 3.6 mL. Normal appearance/no adnexal mass. Left ovary Measurements: 2.4 x 2.8 x 2.1 cm = volume: 10.3 mL. Normal appearance/no adnexal mass. Other findings:  No abnormal free fluid. IMPRESSION: IUD visualized in the endometrial canal. Electronically Signed   By: Misty Stanley M.D.   On: 10/17/2020 15:53   DG Chest Port 1 View  Result Date: 10/07/2020 CLINICAL DATA:  Status post surgery EXAM: PORTABLE CHEST 1 VIEW COMPARISON:  Yesterday FINDINGS: Left chest tube with tip at the apex, unchanged. No visible pneumothorax. Mildly increased atelectasis about the left hilum, with elevation of the left diaphragm. No effusion or pneumothorax. Porta catheter with tip at the upper cavoatrial junction. Normal heart size. Known mediastinal adenopathy. IMPRESSION: Increased left perihilar atelectasis.  No visible pneumothorax. Electronically Signed   By: Monte Fantasia M.D.   On: 10/07/2020 05:51   DG Chest Port 1 View  Result Date: 10/06/2020 CLINICAL DATA:  Status post robot assisted surgical procedure. EXAM: PORTABLE CHEST 1 VIEW COMPARISON:  10/04/2020 plain film 08/25/2020 CT. FINDINGS: Right Port-A-Cath tip at superior caval/atrial junction. Left-sided pleural or mediastinal drain. Midline trachea. Normal heart size. Right paratracheal soft tissue fullness, accentuated by low lung volumes today. No pleural effusion or pneumothorax. Mild subsegmental atelectasis at the lung bases, greater on the left. IMPRESSION: No pneumothorax or other acute complication. Diminished lung volumes with mild bibasilar atelectasis. Right paratracheal adenopathy, as before. Electronically Signed   By: Abigail Miyamoto M.D.   On: 10/06/2020 10:38   10/06/2020 Surgical Pathology   ASSESSMENT & PLAN:   1) Classical Hodgkins lymphoma Likely stage IIIB based on imaging thus far. 2) Microcytic Anemia due  to anemia of chronic disease and Iron deficiency- hg improved to 13 3) Jehovah's Witness   PLAN: -Discussed pt labwork today, 11/03/2020; blood counts all completely normal, chemistries pending and sedimentation pending. -discussed pathology from recent mediastinoscopic resection of mediastinal mass showing no residual hodgkins lymphoma. -Advised pt we will do a repeat PET scan in 6 months, monitoring labs in 3 months. -Recommended pt get Pneumovax vaccine. The pt will get this today. -Discussed [place] and pt's eligibility. The pt desires to receive more information due to potential travel plans. -Advised pt we can remove the Port now at this time. The pt will get this removed following her race in April.  -Recommended that the pt continue to eat well, drink at least 48-64 oz of water each day, and walk 20-30 minutes each day.  -Will see back in 3 months with labs.    FOLLOW UP: Pneumovax today Port flush in 6 weeks RTC with Dr. Irene Limbo with port flush and labs in 3 months    The total time spent in the appointment was 30 minutes and more than 50% was on counseling and direct patient cares,   All of the patient's questions were answered with apparent satisfaction. The patient knows to call the clinic with any problems, questions or concerns.   Sullivan Lone MD [place] AAHIVMS [facility] Hematology/Oncology Physician [facility]  (Office):       [phone] (Work cell):  [phone] (Fax):           [phone]  11/02/2020 12:26 PM  I, Reinaldo Raddle, am acting as scribe for Dr. Sullivan Lone, MD.    .I have reviewed the above documentation for accuracy and completeness, and I agree with the above. Brunetta Genera MD

## 2020-11-03 ENCOUNTER — Other Ambulatory Visit: Payer: Self-pay

## 2020-11-03 ENCOUNTER — Inpatient Hospital Stay: Payer: 59 | Admitting: Hematology

## 2020-11-03 ENCOUNTER — Inpatient Hospital Stay: Payer: 59 | Attending: Hematology

## 2020-11-03 VITALS — BP 114/60 | HR 86 | Temp 97.0°F | Resp 20 | Ht 64.0 in | Wt 221.8 lb

## 2020-11-03 DIAGNOSIS — Z23 Encounter for immunization: Secondary | ICD-10-CM | POA: Insufficient documentation

## 2020-11-03 DIAGNOSIS — D509 Iron deficiency anemia, unspecified: Secondary | ICD-10-CM | POA: Diagnosis not present

## 2020-11-03 DIAGNOSIS — C8111 Nodular sclerosis classical Hodgkin lymphoma, lymph nodes of head, face, and neck: Secondary | ICD-10-CM | POA: Diagnosis not present

## 2020-11-03 DIAGNOSIS — Z833 Family history of diabetes mellitus: Secondary | ICD-10-CM | POA: Insufficient documentation

## 2020-11-03 DIAGNOSIS — Z793 Long term (current) use of hormonal contraceptives: Secondary | ICD-10-CM | POA: Insufficient documentation

## 2020-11-03 DIAGNOSIS — Z95828 Presence of other vascular implants and grafts: Secondary | ICD-10-CM | POA: Diagnosis not present

## 2020-11-03 DIAGNOSIS — Z800 Family history of malignant neoplasm of digestive organs: Secondary | ICD-10-CM | POA: Diagnosis not present

## 2020-11-03 DIAGNOSIS — Z8249 Family history of ischemic heart disease and other diseases of the circulatory system: Secondary | ICD-10-CM | POA: Diagnosis not present

## 2020-11-03 DIAGNOSIS — C8110 Nodular sclerosis classical Hodgkin lymphoma, unspecified site: Secondary | ICD-10-CM

## 2020-11-03 DIAGNOSIS — Z9221 Personal history of antineoplastic chemotherapy: Secondary | ICD-10-CM | POA: Diagnosis not present

## 2020-11-03 DIAGNOSIS — Z975 Presence of (intrauterine) contraceptive device: Secondary | ICD-10-CM | POA: Insufficient documentation

## 2020-11-03 DIAGNOSIS — D500 Iron deficiency anemia secondary to blood loss (chronic): Secondary | ICD-10-CM

## 2020-11-03 LAB — CBC WITH DIFFERENTIAL/PLATELET
Abs Immature Granulocytes: 0.01 10*3/uL (ref 0.00–0.07)
Basophils Absolute: 0 10*3/uL (ref 0.0–0.1)
Basophils Relative: 0 %
Eosinophils Absolute: 0.2 10*3/uL (ref 0.0–0.5)
Eosinophils Relative: 3 %
HCT: 40.8 % (ref 36.0–46.0)
Hemoglobin: 13.9 g/dL (ref 12.0–15.0)
Immature Granulocytes: 0 %
Lymphocytes Relative: 37 %
Lymphs Abs: 2.7 10*3/uL (ref 0.7–4.0)
MCH: 32.8 pg (ref 26.0–34.0)
MCHC: 34.1 g/dL (ref 30.0–36.0)
MCV: 96.2 fL (ref 80.0–100.0)
Monocytes Absolute: 0.4 10*3/uL (ref 0.1–1.0)
Monocytes Relative: 6 %
Neutro Abs: 3.9 10*3/uL (ref 1.7–7.7)
Neutrophils Relative %: 54 %
Platelets: 251 10*3/uL (ref 150–400)
RBC: 4.24 MIL/uL (ref 3.87–5.11)
RDW: 12.3 % (ref 11.5–15.5)
WBC: 7.3 10*3/uL (ref 4.0–10.5)
nRBC: 0 % (ref 0.0–0.2)

## 2020-11-03 LAB — CMP (CANCER CENTER ONLY)
ALT: 13 U/L (ref 0–44)
AST: 12 U/L — ABNORMAL LOW (ref 15–41)
Albumin: 3.9 g/dL (ref 3.5–5.0)
Alkaline Phosphatase: 62 U/L (ref 38–126)
Anion gap: 10 (ref 5–15)
BUN: 12 mg/dL (ref 6–20)
CO2: 22 mmol/L (ref 22–32)
Calcium: 8.9 mg/dL (ref 8.9–10.3)
Chloride: 107 mmol/L (ref 98–111)
Creatinine: 0.78 mg/dL (ref 0.44–1.00)
GFR, Estimated: >60 mL/min (ref >60)
Glucose, Bld: 85 mg/dL (ref 70–99)
Potassium: 3.9 mmol/L (ref 3.5–5.1)
Sodium: 139 mmol/L (ref 135–145)
Total Bilirubin: 1.4 mg/dL — ABNORMAL HIGH (ref 0.3–1.2)
Total Protein: 7.2 g/dL (ref 6.5–8.1)

## 2020-11-03 LAB — SEDIMENTATION RATE: Sed Rate: 17 mm/hr (ref 0–22)

## 2020-11-03 MED ORDER — PNEUMOCOCCAL VAC POLYVALENT 25 MCG/0.5ML IJ INJ
0.5000 mL | INJECTION | Freq: Once | INTRAMUSCULAR | Status: AC
  Administered 2020-11-03: 0.5 mL via INTRAMUSCULAR
  Filled 2020-11-03: qty 0.5

## 2020-11-07 ENCOUNTER — Other Ambulatory Visit: Payer: Self-pay | Admitting: Family Medicine

## 2020-11-07 DIAGNOSIS — Z975 Presence of (intrauterine) contraceptive device: Secondary | ICD-10-CM

## 2020-11-07 MED ORDER — MISOPROSTOL 200 MCG PO TABS: ORAL_TABLET | ORAL | 2 refills | Status: DC

## 2020-11-07 MED FILL — miSOPROStol 200 MCG TABS: 200 | 1 days supply | Qty: 2 | Fill #0

## 2020-11-08 ENCOUNTER — Telehealth: Payer: Self-pay | Admitting: *Deleted

## 2020-11-08 ENCOUNTER — Encounter: Payer: Self-pay | Admitting: *Deleted

## 2020-11-08 NOTE — Telephone Encounter (Signed)
Elizabeth Beasley called - requested letter to give to daughters' school. Daughters are learning remotely, but must go in to school for assessment testing. School advised Elizabeth Beasley that daughters can be tested in room with less children to decrease their exposure to any infectious conditions and in turn, decrease her exposure. In order to accommodate this request, the schools need a letter stating that mother is under care of oncologist. Letters prepared for patient to pick up

## 2020-11-09 ENCOUNTER — Encounter: Payer: Self-pay | Admitting: *Deleted

## 2020-11-17 ENCOUNTER — Inpatient Hospital Stay: Payer: 59 | Attending: Hematology

## 2020-11-17 ENCOUNTER — Other Ambulatory Visit: Payer: Self-pay

## 2020-11-17 DIAGNOSIS — D500 Iron deficiency anemia secondary to blood loss (chronic): Secondary | ICD-10-CM

## 2020-11-17 DIAGNOSIS — C8111 Nodular sclerosis classical Hodgkin lymphoma, lymph nodes of head, face, and neck: Secondary | ICD-10-CM | POA: Diagnosis not present

## 2020-11-17 DIAGNOSIS — Z7189 Other specified counseling: Secondary | ICD-10-CM

## 2020-11-17 DIAGNOSIS — Z452 Encounter for adjustment and management of vascular access device: Secondary | ICD-10-CM | POA: Insufficient documentation

## 2020-11-17 DIAGNOSIS — Z95828 Presence of other vascular implants and grafts: Secondary | ICD-10-CM

## 2020-11-17 MED ORDER — HEPARIN SOD (PORK) LOCK FLUSH 100 UNIT/ML IV SOLN
500.0000 [IU] | Freq: Once | INTRAVENOUS | Status: AC
  Administered 2020-11-17: 500 [IU]
  Filled 2020-11-17: qty 5

## 2020-11-17 MED ORDER — SODIUM CHLORIDE 0.9% FLUSH
10.0000 mL | Freq: Once | INTRAVENOUS | Status: AC
  Administered 2020-11-17: 10 mL
  Filled 2020-11-17: qty 10

## 2020-11-17 NOTE — Patient Instructions (Signed)

## 2020-11-30 ENCOUNTER — Encounter: Payer: Self-pay | Admitting: Obstetrics & Gynecology

## 2020-11-30 ENCOUNTER — Other Ambulatory Visit: Payer: Self-pay

## 2020-11-30 ENCOUNTER — Ambulatory Visit (INDEPENDENT_AMBULATORY_CARE_PROVIDER_SITE_OTHER): Payer: 59 | Admitting: Obstetrics & Gynecology

## 2020-11-30 VITALS — BP 124/76 | HR 105

## 2020-11-30 DIAGNOSIS — Z30432 Encounter for removal of intrauterine contraceptive device: Secondary | ICD-10-CM | POA: Diagnosis not present

## 2020-11-30 NOTE — Progress Notes (Signed)
    GYNECOLOGY OFFICE PROCEDURE NOTE  NIRALI MAGOUIRK is a 40 y.o. [phone] here for Mirena IUD removal, has unsuccessful attempt during last visit.  Ultrasound showed it to be in place. Premedicated with misoprostol.  No GYN concerns.  Last pap smear was on 04/26/2020 and was normal.  IUD Removal  Patient identified, informed consent performed, consent signed.  Patient was in the dorsal lithotomy position, normal external genitalia was noted.  A speculum was placed in the patient's vagina, normal discharge was noted, no lesions. The cervix was visualized, no lesions, no abnormal discharge.  The strings of the IUD were not visualized, the cervix was swabbed with betadine. Kelly forceps were introduced but unable to get pass the internal cervical os. The plastic dilators were used to breach this stenosis, and Kelly forceps were introduced into endometrial cavity. After a few passes, unable to grasp IUD or strings.  The IUD hook was then tried and the strings were grasped after a few attempts and brought to external os. Kelly forceps were used to grasp the IUD and to pull the IUD out of the uterus, intact.  There was small amount of bleeding during procedure. Patient tolerated the procedure well.    Patient in unsure of what she wants for contraception for now.   Routine preventative health maintenance measures emphasized.   Verita Schneiders, MD, [place] for [employer], [place]

## 2020-12-12 ENCOUNTER — Other Ambulatory Visit: Payer: Self-pay | Admitting: Hematology

## 2020-12-12 ENCOUNTER — Telehealth: Payer: Self-pay | Admitting: *Deleted

## 2020-12-12 DIAGNOSIS — C8111 Nodular sclerosis classical Hodgkin lymphoma, lymph nodes of head, face, and neck: Secondary | ICD-10-CM

## 2020-12-12 DIAGNOSIS — H9313 Tinnitus, bilateral: Secondary | ICD-10-CM

## 2020-12-12 NOTE — Telephone Encounter (Signed)
Patient called - said she is still having ringing in her ears. She said Dr. Irene Limbo recommended a referral to an ENT. She wants to know if he referred her or if she needs to call her PCP? Also wants port removal ordered. Dr. Irene Limbo informed Dr. Irene Limbo placed referral to ENT and ordered port removal. Contacted patient to inform her - LVM with information. Advised her to call office if no one calls from IR to schedule port removal

## 2020-12-15 ENCOUNTER — Inpatient Hospital Stay: Payer: 59

## 2020-12-15 ENCOUNTER — Telehealth: Payer: Self-pay | Admitting: Hematology

## 2020-12-15 NOTE — Telephone Encounter (Signed)
R/s appt per 4/1 sch msg. Pt aware.

## 2020-12-22 ENCOUNTER — Inpatient Hospital Stay: Payer: 59

## 2020-12-22 ENCOUNTER — Telehealth: Payer: Self-pay | Admitting: Hematology

## 2020-12-22 NOTE — Telephone Encounter (Signed)
Called and spoke with patient to r/s todays appt per her request. Confirmed new date and time

## 2020-12-29 ENCOUNTER — Inpatient Hospital Stay: Payer: 59

## 2021-01-08 ENCOUNTER — Telehealth: Payer: Self-pay

## 2021-01-08 NOTE — Telephone Encounter (Signed)
Pt called in to report she noticed a very small lymph node behind her ear. Pt was wondering if she should be concerned. Will review with MD for further direction.

## 2021-01-09 ENCOUNTER — Other Ambulatory Visit: Payer: Self-pay | Admitting: Student

## 2021-01-10 ENCOUNTER — Other Ambulatory Visit: Payer: Self-pay | Admitting: Student

## 2021-01-10 ENCOUNTER — Other Ambulatory Visit: Payer: Self-pay | Admitting: Radiology

## 2021-01-10 ENCOUNTER — Ambulatory Visit (INDEPENDENT_AMBULATORY_CARE_PROVIDER_SITE_OTHER): Payer: 59 | Admitting: Otolaryngology

## 2021-01-10 ENCOUNTER — Other Ambulatory Visit: Payer: Self-pay

## 2021-01-10 ENCOUNTER — Encounter (INDEPENDENT_AMBULATORY_CARE_PROVIDER_SITE_OTHER): Payer: Self-pay | Admitting: Otolaryngology

## 2021-01-10 VITALS — Temp 97.2°F

## 2021-01-10 DIAGNOSIS — H9313 Tinnitus, bilateral: Secondary | ICD-10-CM | POA: Diagnosis not present

## 2021-01-10 NOTE — Progress Notes (Signed)
HPI: Elizabeth Beasley is a 40 y.o. female who presents is referred by  Dr. Irene Limbo for evaluation of ringing in both ears that started back in December.  She stated that she woke up 1 morning with ringing in her ears and it has been persistent.  She denies any loud noise exposure.  She has not noted any hearing problems. 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 She has history of Hodgkin's lymphoma..  Past Medical History:  Diagnosis Date  . Anemia   . Bartholin gland cyst   . Headache   . Hodgkin's lymphoma ([place])   . 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 Hx of seasonal allergies   . Lymphadenopathy, cervical 05/21/2019  . PONV (postoperative nausea and vomiting)   . 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 CESAREAN SECTION  2010  . INTERCOSTAL NERVE BLOCK  10/06/2020   Procedure: INTERCOSTAL NERVE BLOCK;  Surgeon: [name], MD;  Location: [place];  Service: Thoracic;;  . LYMPH NODE BIOPSY Left 10/06/2020   Procedure: MEDIASTINAL LYMPH NODE BIOPSY;  Surgeon: [name], MD;  Location: [place];  Service: Thoracic;  Laterality: Left;  Marland Kitchen MASS EXCISION Right 05/21/2019   Procedure: EXCISION OF RIGHT DEEP NECK LYMPH NODE;  Surgeon: Fanny Skates, MD;  Location: [place];  Service: General;  Laterality: Right;  . PORTACATH PLACEMENT Right 06/08/2019   Procedure: INSERTION PORT-A-CATH WITH ULTRASOUND GUIDANCE;  Surgeon: Fanny Skates, MD;  Location: [place];  Service: General;  Laterality: Right;  . URETHRAL CYST REMOVAL N/A 08/30/2016   Procedure: BARTHOLIN'S GLAND CYST EXCISION;  Surgeon: Ardis Hughs, MD;  Location[facility];  Service: Urology;  Laterality: N/A;  . v back     2012   Social History   Socioeconomic History  . Marital status: Married    Spouse name: Montine Circle  . Number of children: 2  . Years of education:  Assoc  . Highest education level: Not on file  Occupational History  . Occupation: CMA    Employer: [employer]  Tobacco Use  . Smoking status: Never Smoker  . Smokeless tobacco: Never Used  Vaping Use  . Vaping Use: Never used  Substance and Sexual Activity  . Alcohol use: Not Currently    Comment: ocassional  . Drug use: No  . Sexual activity: Yes    Partners: Male    Birth control/protection: I.U.D.  Other Topics Concern  . Not on file  Social History Narrative   Regular exercise. No regular caffeine.   Social Determinants of Health   Financial Resource Strain: Not on file  Food Insecurity: Not on file  Transportation Needs: Not on file  Physical Activity: Not on file  Stress: Not on file  Social Connections: Not on file   Family History  Problem Relation Age of Onset  . Colon cancer Father 91  . Colon polyps Mother   . Hypertension Mother   . Diabetes Paternal Grandmother   . Stroke Paternal Grandmother   . Alcohol abuse Paternal Uncle   . Colon cancer Paternal Aunt 60  . Esophageal cancer Neg Hx   . Rectal cancer Neg Hx   . Stomach cancer Neg Hx    No Known Allergies Prior to Admission medications   Medication Sig Start Date End Date Taking? Authorizing Provider  b complex vitamins capsule Take 1  capsule by mouth daily.    [provider]  cholecalciferol (VITAMIN D3) 25 MCG (1000 UT) tablet Take 2,000 Units by mouth daily.    [provider]  fluticasone (FLONASE) 50 MCG/ACT nasal spray Place 2 sprays into both nostrils daily as needed for allergies or rhinitis.    [provider]  levonorgestrel (MIRENA) 20 MCG/24HR IUD 1 each by Intrauterine route once.    [provider]  loratadine (CLARITIN) 10 MG tablet Take 10 mg by mouth daily.    [provider]  misoprostol (CYTOTEC) 200 MCG tablet PLACE TWO TABLETS IN THE VAGINA THE NIGHT PRIOR TO YOUR NEXT CLINIC APPOINTMENT 11/07/20 11/07/21  Caren Macadam, MD   oxyCODONE-acetaminophen (PERCOCET/ROXICET) 5-325 MG tablet TAKE 1 TABLET BY MOUTH EVERY 4 HOURS AS NEEDED FOR SEVERE PAIN FOR UP TO 3 DAYS 10/07/20 04/05/21  Antony Odea, PA-C  Polyvinyl Alcohol-Povidone (REFRESH OP) Place 1 drop into both eyes daily as needed (dry eyes).    [provider]  citalopram (CELEXA) 10 MG tablet Take 1 tablet (10 mg total) by mouth daily. 09/01/20 09/27/20  Brunetta Genera, MD     Positive ROS: Otherwise negative  All other systems have been reviewed and were otherwise negative with the exception of those mentioned in the HPI and as above.  Physical Exam: Constitutional: Alert, well-appearing, no acute distress Ears: External ears without lesions or tenderness. Ear canals are clear bilaterally.  TMs are clear bilaterally. Nasal: External nose without lesions. Septum with minimal deformity and moderate rhinitis.  Clear mucus within the nasal cavity.  Both middle meatus regions are clear..  Oral: Lips and gums without lesions. Tongue and palate mucosa without lesions. Posterior oropharynx clear.  Tonsils symmetric. Neck: No palpable adenopathy or masses.  She does have some mild TMJ dysfunction more on the left side. Respiratory: Breathing comfortably  Skin: No facial/neck lesions or rash noted.  Audiologic testing in the office today demonstrated normal hearing in both ears with type A tympanograms bilaterally.  SRT's were 5 dB bilaterally.  Procedures  Assessment: Tinnitus questionable etiology.  Normal hearing evaluation.  Plan: Reviewed the audiologic testing with the patient in the office today which demonstrated normal hearing. Discussed with her concerning minimal treatment options for tinnitus.  I discussed with her concerning using masking noise to help when the tinnitus is bothersome.  Also recommended using ear protection when around loud noise or exposed to loud sounds. I gave her some samples of Lipo flavonoid to try as this  is beneficial in some people with the tinnitus.   Radene Journey, MD   CC:

## 2021-01-11 ENCOUNTER — Ambulatory Visit (HOSPITAL_COMMUNITY)
Admission: RE | Admit: 2021-01-11 | Discharge: 2021-01-11 | Disposition: A | Discharge status: Home / Self Care | Payer: 59 | Source: Ambulatory Visit | Attending: Hematology | Admitting: Hematology

## 2021-01-11 ENCOUNTER — Other Ambulatory Visit: Payer: Self-pay

## 2021-01-11 ENCOUNTER — Telehealth: Payer: Self-pay | Admitting: *Deleted

## 2021-01-11 ENCOUNTER — Encounter (HOSPITAL_COMMUNITY): Payer: Self-pay

## 2021-01-11 DIAGNOSIS — Z8571 Personal history of Hodgkin lymphoma: Secondary | ICD-10-CM | POA: Diagnosis not present

## 2021-01-11 DIAGNOSIS — C8111 Nodular sclerosis classical Hodgkin lymphoma, lymph nodes of head, face, and neck: Secondary | ICD-10-CM | POA: Diagnosis not present

## 2021-01-11 DIAGNOSIS — Z452 Encounter for adjustment and management of vascular access device: Secondary | ICD-10-CM | POA: Diagnosis not present

## 2021-01-11 HISTORY — PX: IR REMOVAL TUN ACCESS W/ PORT W/O FL MOD SED: IMG2290

## 2021-01-11 LAB — NO BLOOD PRODUCTS

## 2021-01-11 MED ORDER — FENTANYL CITRATE (PF) 100 MCG/2ML IJ SOLN
INTRAMUSCULAR | Status: AC
  Filled 2021-01-11: qty 2

## 2021-01-11 MED ORDER — MIDAZOLAM HCL 2 MG/2ML IJ SOLN
INTRAMUSCULAR | Status: AC | PRN
  Administered 2021-01-11 (×3): 1 mg via INTRAVENOUS

## 2021-01-11 MED ORDER — SODIUM CHLORIDE 0.9 % IV SOLN: INTRAVENOUS | Status: DC

## 2021-01-11 MED ORDER — FENTANYL CITRATE (PF) 100 MCG/2ML IJ SOLN
INTRAMUSCULAR | Status: AC | PRN
  Administered 2021-01-11: 50 ug via INTRAVENOUS

## 2021-01-11 MED ORDER — MIDAZOLAM HCL 2 MG/2ML IJ SOLN
INTRAMUSCULAR | Status: AC
  Filled 2021-01-11: qty 2

## 2021-01-11 MED ORDER — LIDOCAINE HCL 1 % IJ SOLN
INTRAMUSCULAR | Status: AC
  Filled 2021-01-11: qty 20

## 2021-01-11 MED ORDER — MIDAZOLAM HCL 2 MG/2ML IJ SOLN
INTRAMUSCULAR | Status: AC | PRN
  Administered 2021-01-11: 1 mg via INTRAVENOUS

## 2021-01-11 MED ORDER — LIDOCAINE HCL (PF) 1 % IJ SOLN
INTRAMUSCULAR | Status: AC | PRN
  Administered 2021-01-11: 10 mL via INTRADERMAL

## 2021-01-11 NOTE — Telephone Encounter (Signed)
Patient called - small bump appeared behind Left ear on Saturday. No pain/redness. Has since become much smaller and is barely noticeable. She wanted to know if she should get this checked? She said she kind of just wanted to know if she should worry Dr.Kale informed - his response: If improving-- would monitor it. COuld be insect bite/allergic skin condition/carbuncle. if its a LN she would feel it separate from skin. If increasing might need to see PCP or [place] for further evaluation Contacted patient - LVM with Dr. Grier Mitts response and encouraged to call office for further concerns or questions

## 2021-01-11 NOTE — H&P (Signed)
Chief Complaint: Patient was seen in consultation today for Hodgkin's lymphoma/Port-a-cath removal.  Referring Physician(s): Brunetta Genera (oncology)  Supervising Physician: Markus Daft  Patient Status: [facility] - Out-pt  History of Present Illness: Elizabeth Beasley is a 40 y.o. female with a past medical history of headaches, Hodgkin's lymphoma, urethral diverticulum, and anemia. She was unfortunately diagnosed with Hodgkin's lymphoma in 2020. Her cancer is managed by Dr. Irene Limbo. She had a Port-a-cath placed in OR 06/08/2019 by Dr. Dalbert Batman and has completed systemic chemotherapy as management. She was found to have anterior mediastinal lymphadenopathy and underwent biopsy of this in OR 10/06/2020 by Dr. Roxan Hockey which revealed no evidence of Hodgkin's lymphoma. She is currently under active surveillance.  IR consulted by Dr. Irene Limbo for possible Port-a-cath removal. Patient awake and alert laying in bed with no complaints at this time. Denies fever, chills, chest pain, dyspnea, abdominal pain, or headache.    Past Medical History:  Diagnosis Date  . Anemia   . Bartholin gland cyst   . Headache   . Hodgkin's lymphoma ([place])   . 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 Hx of seasonal allergies   . Lymphadenopathy, cervical 05/21/2019  . PONV (postoperative nausea and vomiting)   . 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 CESAREAN SECTION  2010  . INTERCOSTAL NERVE BLOCK  10/06/2020   Procedure: INTERCOSTAL NERVE BLOCK;  Surgeon: [name], MD;  Location: [place];  Service: Thoracic;;  . LYMPH NODE BIOPSY Left 10/06/2020   Procedure: MEDIASTINAL LYMPH NODE BIOPSY;  Surgeon: [name], MD;  Location: [place];  Service: Thoracic;  Laterality: Left;  Marland Kitchen MASS EXCISION Right 05/21/2019   Procedure: EXCISION OF RIGHT DEEP NECK LYMPH NODE;  Surgeon: Fanny Skates, MD;   Location: [place];  Service: General;  Laterality: Right;  . PORTACATH PLACEMENT Right 06/08/2019   Procedure: INSERTION PORT-A-CATH WITH ULTRASOUND GUIDANCE;  Surgeon: Fanny Skates, MD;  Location: [place];  Service: General;  Laterality: Right;  . URETHRAL CYST REMOVAL N/A 08/30/2016   Procedure: BARTHOLIN'S GLAND CYST EXCISION;  Surgeon: Ardis Hughs, MD;  Location: [facility];  Service: Urology;  Laterality: N/A;  . v back     2012    Allergies: Other  Medications: Prior to Admission medications   Medication Sig Start Date End Date Taking? Authorizing Provider  b complex vitamins capsule Take 1 capsule by mouth daily.   Yes [provider]  cholecalciferol (VITAMIN D3) 25 MCG (1000 UT) tablet Take 2,000 Units by mouth daily.   Yes [provider]  fluticasone (FLONASE) 50 MCG/ACT nasal spray Place 2 sprays into both nostrils daily as needed for allergies or rhinitis.   Yes [provider]  loratadine (CLARITIN) 10 MG tablet Take 10 mg by mouth daily.   Yes [provider]  Polyvinyl Alcohol-Povidone (REFRESH OP) Place 1 drop into both eyes daily as needed (dry eyes).   Yes [provider]  levonorgestrel (MIRENA) 20 MCG/24HR IUD 1 each by Intrauterine route once.    [provider]  misoprostol (CYTOTEC) 200 MCG tablet PLACE TWO TABLETS IN THE VAGINA THE NIGHT PRIOR TO YOUR NEXT CLINIC APPOINTMENT 11/07/20 11/07/21  Caren Macadam, MD  oxyCODONE-acetaminophen (PERCOCET/ROXICET) 5-325 MG tablet TAKE 1 TABLET BY MOUTH EVERY 4 HOURS AS NEEDED FOR SEVERE PAIN FOR UP TO 3 DAYS 10/07/20 04/05/21  Antony Odea, PA-C  citalopram (CELEXA) 10 MG tablet Take 1 tablet (10 mg total) by mouth daily. 09/01/20 09/27/20  Brunetta Genera, MD     Family History  Problem Relation Age of Onset  . Colon cancer Father 53  . Colon polyps Mother   . Hypertension Mother   . Diabetes Paternal  Grandmother   . Stroke Paternal Grandmother   . Alcohol abuse Paternal Uncle   . Colon cancer Paternal Aunt 44  . Esophageal cancer Neg Hx   . Rectal cancer Neg Hx   . Stomach cancer Neg Hx     Social History   Socioeconomic History  . Marital status: Married    Spouse name: Montine Circle  . Number of children: 2  . Years of education: Assoc  . Highest education level: Not on file  Occupational History  . Occupation: CMA    Employer: [employer]  Tobacco Use  . Smoking status: Never Smoker  . Smokeless tobacco: Never Used  Vaping Use  . Vaping Use: Never used  Substance and Sexual Activity  . Alcohol use: Not Currently    Comment: ocassional  . Drug use: No  . Sexual activity: Yes    Partners: Male    Birth control/protection: I.U.D.  Other Topics Concern  . Not on file  Social History Narrative   Regular exercise. No regular caffeine.   Social Determinants of Health   Financial Resource Strain: Not on file  Food Insecurity: Not on file  Transportation Needs: Not on file  Physical Activity: Not on file  Stress: Not on file  Social Connections: Not on file     Review of Systems: A 12 point ROS discussed and pertinent positives are indicated in the HPI above.  All other systems are negative.  Review of Systems  Constitutional: Negative for chills and fever.  Respiratory: Negative for shortness of breath and wheezing.   Cardiovascular: Negative for chest pain and palpitations.  Gastrointestinal: Negative for abdominal pain.  Neurological: Negative for headaches.  Psychiatric/Behavioral: Negative for behavioral problems and confusion.    Vital Signs: BP 128/86   Pulse 81   Temp 98.3 F (36.8 C) (Oral)   Resp 16   Ht 5\' 4"  (1.626 m)   Wt 215 lb (97.5 kg)   LMP 01/02/2021 (Exact Date)   SpO2 99%   BMI 36.90 kg/m   Physical Exam Vitals and nursing note reviewed.  Constitutional:      General: She is not in acute distress.    Appearance: Normal appearance.   Cardiovascular:     Rate and Rhythm: Normal rate and regular rhythm.     Heart sounds: Normal heart sounds. No murmur heard.   Pulmonary:     Effort: Pulmonary effort is normal. No respiratory distress.     Breath sounds: Normal breath sounds. No wheezing.  Skin:    General: Skin is warm and dry.     Comments: Right chest Port-a-cath incision well healed, site without tenderness, erythema, drainage, or active bleeding.  Neurological:     Mental Status: She is alert and oriented to person, place, and time.      MD Evaluation Airway: WNL Heart: WNL Abdomen: WNL Chest/ Lungs: WNL ASA  Classification: 3 Mallampati/Airway Score: One   Imaging: No results found.  Labs:  CBC: Recent Labs    09/01/20 0923 10/04/20 1341 10/07/20 0128 11/03/20 0949  WBC 9.0 10.0 14.9* 7.3  HGB 14.4 14.6 13.1 13.9  HCT 41.4 40.8 38.3 40.8  PLT 248 278 272 251    COAGS: Recent Labs    10/04/20 1341  INR 1.0  APTT 28    BMP: Recent Labs    01/14/20 1108 03/31/20 1130 06/09/20 1000 09/01/20 0923 10/04/20 1341 10/07/20 0128 11/03/20 0949  NA 142 140 139 139 139 137 139  K 3.7 3.9 4.0 3.8 3.6 3.5 3.9  CL 107 107 105 107 106 107 107  CO2 25 23 28 23 22 22 22   GLUCOSE 85 84 79 104* 92 135* 85  BUN 12 11 10 9 11 8 12   CALCIUM 8.9 9.2 9.0 9.3 9.2 8.4* 8.9  CREATININE 0.71 0.74 0.70 0.81 0.85 0.85 0.78  GFRNONAA >60 >60 >60 >60 >60 >60 >60  GFRAA >60 >60 >60  --   --   --   --     LIVER FUNCTION TESTS: Recent Labs    06/09/20 1000 09/01/20 0923 10/04/20 1341 11/03/20 0949  BILITOT 1.7* 1.6* 1.2 1.4*  AST 15 16 17  12*  ALT 15 22 17 13   ALKPHOS 59 59 57 62  PROT 7.0 7.6 7.1 7.2  ALBUMIN 3.7 3.9 3.9 3.9     Assessment and Plan:  Hodgkin's lymphoma s/p systemic chemotherapy (via right IJ Port-a-cath placed in OR 06/06/2019 by Dr. Dalbert Batman), currently under active surveillance. Plan for Port-a-cath removal today in IR. Patient is NPO. Afebrile.  Risks and benefits  of Port-a-catheter removal were discussed with the patient including, but not limited to bleeding or infection. All of the patient's questions were answered, patient is agreeable to proceed. Consent signed and in chart.   Thank you for this interesting consult.  I greatly enjoyed meeting Elizabeth Beasley and look forward to participating in their care.  A copy of this report was sent to the requesting provider on this date.  Electronically Signed: Earley Abide, PA-C 01/11/2021, 8:47 AM   I spent a total of 15 Minutes in face to face in clinical consultation, greater than 50% of which was counseling/coordinating care for Hodgkin's lymphoma/Port-a-cath removal.

## 2021-01-11 NOTE — Procedures (Signed)
Interventional Radiology Procedure:   Indications: Port is no longer needed  Procedure: Port removal  Findings: Complete removal of right chest port.  Complications: None     EBL: Minimal, less than 10 ml  Plan: Discharge in one hour.  Keep port site and incisions dry for at least 24 hours.     Elizabeth Beasley. [name], MD  Pager: [phone]

## 2021-01-11 NOTE — Discharge Instructions (Signed)
Please call Interventional Radiology clinic [phone] with any questions or concerns.  You may remove your dressing and shower tomorrow.   Implanted Port Removal, Care After This sheet gives you information about how to care for yourself after your procedure. Your health care provider may also give you more specific instructions. If you have problems or questions, contact your health care provider. What can I expect after the procedure? After the procedure, it is common to have:  Soreness or pain near your incision.  Some swelling or bruising near your incision. Follow these instructions at home: Medicines  Take over-the-counter and prescription medicines only as told by your health care provider.  If you were prescribed an antibiotic medicine, take it as told by your health care provider. Do not stop taking the antibiotic even if you start to feel better. Bathing 1. Do not take baths, swim, or use a hot tub until your health care provider approves. Ask your health care provider if you can take showers. You may only be allowed to take sponge baths. Incision care 1. Follow instructions from your health care provider about how to take care of your incision. Make sure you: ? Wash your hands with soap and water before you change your bandage (dressing). If soap and water are not available, use hand sanitizer. ? Change your dressing as told by your health care provider. ? Keep your dressing dry. ? Leave stitches (sutures), skin glue, or adhesive strips in place. These skin closures may need to stay in place for 2 weeks or longer. If adhesive strip edges start to loosen and curl up, you may trim the loose edges. Do not remove adhesive strips completely unless your health care provider tells you to do that. 2. Check your incision area every day for signs of infection. Check for: ? More redness, swelling, or pain. ? More fluid or blood. ? Warmth. ? Pus or a bad smell.    Driving  Do not  drive for 24 hours if you were given a medicine to help you relax (sedative) during your procedure.  If you did not receive a sedative, ask your health care provider when it is safe to drive.    Activity  Return to your normal activities as told by your health care provider. Ask your health care provider what activities are safe for you.  Do not lift anything that is heavier than 10 lb (4.5 kg), or the limit that you are told, until your health care provider says that it is safe.  Do not do activities that involve lifting your arms over your head. General instructions  Do not use any products that contain nicotine or tobacco, such as cigarettes and e-cigarettes. These can delay healing. If you need help quitting, ask your health care provider.  Keep all follow-up visits as told by your health care provider. This is important. Contact a health care provider if:  You have more redness, swelling, or pain around your incision.  You have more fluid or blood coming from your incision.  Your incision feels warm to the touch.  You have pus or a bad smell coming from your incision.  You have pain that is not relieved by your pain medicine. Get help right away if you have:  A fever or chills.  Chest pain.  Difficulty breathing. Summary  After the procedure, it is common to have pain, soreness, swelling, or bruising near your incision.  If you were prescribed an antibiotic medicine, take it as  told by your health care provider. Do not stop taking the antibiotic even if you start to feel better.  Do not drive for 24 hours if you were given a sedative during your procedure.  Return to your normal activities as told by your health care provider. Ask your health care provider what activities are safe for you. This information is not intended to replace advice given to you by your health care provider. Make sure you discuss any questions you have with your health care provider. Document  Revised: 10/16/2017 Document Reviewed: 10/16/2017 Elsevier Patient Education  2021 [place].   Moderate Conscious Sedation, Adult, Care After This sheet gives you information about how to care for yourself after your procedure. Your health care provider may also give you more specific instructions. If you have problems or questions, contact your health care provider. What can I expect after the procedure? After the procedure, it is common to have:  Sleepiness for several hours.  Impaired judgment for several hours.  Difficulty with balance.  Vomiting if you eat too soon. Follow these instructions at home: For the time period you were told by your health care provider:  Rest.  Do not participate in activities where you could fall or become injured.  Do not drive or use machinery.  Do not drink alcohol.  Do not take sleeping pills or medicines that cause drowsiness.  Do not make important decisions or sign legal documents.  Do not take care of children on your own.        Eating and drinking 2. Follow the diet recommended by your health care provider. 3. Drink enough fluid to keep your urine pale yellow. 4. If you vomit: ? Drink water, juice, or soup when you can drink without vomiting. ? Make sure you have little or no nausea before eating solid foods.    General instructions 3. Take over-the-counter and prescription medicines only as told by your health care provider. 4. Have a responsible adult stay with you for the time you are told. It is important to have someone help care for you until you are awake and alert. 5. Do not smoke. 6. Keep all follow-up visits as told by your health care provider. This is important. Contact a health care provider if:  You are still sleepy or having trouble with balance after 24 hours.  You feel light-headed.  You keep feeling nauseous or you keep vomiting.  You develop a rash.  You have a fever.  You have redness or  swelling around the IV site. Get help right away if:  You have trouble breathing.  You have new-onset confusion at home. Summary  After the procedure, it is common to feel sleepy, have impaired judgment, or feel nauseous if you eat too soon.  Rest after you get home. Know the things you should not do after the procedure.  Follow the diet recommended by your health care provider and drink enough fluid to keep your urine pale yellow.  Get help right away if you have trouble breathing or new-onset confusion at home. This information is not intended to replace advice given to you by your health care provider. Make sure you discuss any questions you have with your health care provider. Document Revised: 12/31/2019 Document Reviewed: 07/29/2019 Elsevier Patient Education  2021 [employer].

## 2021-01-16 ENCOUNTER — Encounter (INDEPENDENT_AMBULATORY_CARE_PROVIDER_SITE_OTHER): Payer: Self-pay

## 2021-02-01 NOTE — Progress Notes (Signed)
HEMATOLOGY/ONCOLOGY CLINIC NOTE  Date of Service: 02/02/21   Patient Care Team: [name], MD as PCP - General (Family Medicine)  CHIEF COMPLAINTS/PURPOSE OF CONSULTATION:   Continue management of classical hodgkins lymphoma   CURRENT TREATMENT: Active surveillance   HISTORY OF PRESENTING ILLNESS:   Elizabeth Beasley is a wonderful 40 y.o. female who has been referred to [place] by Dr Madilyn Fireman for evaluation and management of microcytic anemia/cervical lymphadenopathy.The pt reports that she has a history of iron deficiency but has never had a transfusion. Pt has had two pregnancies and has two children, ages 4 and 25. There was a concern for anemia with her second pregnancy so she was given an erythropoietin shot. She has had a copper IUD for about 2 years and her menstrual cycle has been slightly longer since getting the IUD. She has been taking 28 mg TID PO iron since June. Pt has no known medication allergies and has had a previous removal of Bartholin's gland cyst. Pt has had no known contact with chemicals either through work or hobbies. She has no history of asthma or heavy smoke/aerosol exposure.   Pt did not notice any cervical lymphadenopathy in June. In late July, early August she began to experience extreme fatigue and could begin to feel small lumps in her neck. Her fatigue is improved with taking her PO Iron TID. She has experienced a couple of episodes of night sweats, with the latest occurring in late August. Pt has also had <10 lbs loss in 2 months. She has not had any rashes but is experiencing itching in her palms, broader itching after hot showers.   Pt is currently working from home. She has had her seasonal flu shot. Pt is not planning on having more children and is not concerned about fertility. Pt's husband would like to take a drive to the [facility] next month. Pt would not want any blood product transfusions, as she is a Jehovah's Witness. She  is comfortable with IV iron infusions.   INTERVAL HISTORY:  Elizabeth Beasley is a 40 y.o. female here for evaluation and management of Hodgkins Lymphoma. Her final chemotherapy treatment was on 11/19/2019. The patient's last visit with [place] was on 11/03/2020. The pt reports that she is doing well overall.  The pt reports that she has recently completed a 10K and visited [place]. She notes no new symptoms or concerns. She continues to take the Vitamin B Complex and D. The pt notes her menstrual periods are regular.  Lab results today 02/02/2021 of CBC w/diff and CMP is as follows: all values are WNL except for AST of 14. 02/02/2021 Ferritin  02/02/2021 Sedimentation rate wnl at 18  On review of systems, pt reports intermittent numbness in cheeks and denies fevers, chills, night sweats, new lumps/bumps, abdominal pain, infection issues, leg swelling, and any other symptoms.  MEDICAL HISTORY:  Past Medical History:  Diagnosis Date  . Anemia   . Bartholin gland cyst   . Headache   . Hodgkin's lymphoma ([place])   . 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 Hx of seasonal allergies   . Lymphadenopathy, cervical 05/21/2019  . PONV (postoperative nausea and vomiting)   . 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 CESAREAN SECTION  2010  . INTERCOSTAL NERVE BLOCK  10/06/2020   Procedure: INTERCOSTAL NERVE BLOCK;  Surgeon:  [name], MD;  Location: [facility] OR;  Service: Thoracic;;  . IR REMOVAL TUN ACCESS W/ PORT W/O FL MOD SED  01/11/2021  . LYMPH NODE BIOPSY Left 10/06/2020   Procedure: MEDIASTINAL LYMPH NODE BIOPSY;  Surgeon: [name], MD;  Location: [place];  Service: Thoracic;  Laterality: Left;  Marland Kitchen MASS EXCISION Right 05/21/2019   Procedure: EXCISION OF RIGHT DEEP NECK LYMPH NODE;  Surgeon: Fanny Skates, MD;  Location: [place];  Service: General;  Laterality: Right;   . PORTACATH PLACEMENT Right 06/08/2019   Procedure: INSERTION PORT-A-CATH WITH ULTRASOUND GUIDANCE;  Surgeon: Fanny Skates, MD;  Location: [place];  Service: General;  Laterality: Right;  . URETHRAL CYST REMOVAL N/A 08/30/2016   Procedure: BARTHOLIN'S GLAND CYST EXCISION;  Surgeon: Ardis Hughs, MD;  Location: [facility];  Service: Urology;  Laterality: N/A;  . v back     2012    SOCIAL HISTORY: Social History   Socioeconomic History  . Marital status: Married    Spouse name: Montine Circle  . Number of children: 2  . Years of education: Assoc  . Highest education level: Not on file  Occupational History  . Occupation: CMA    Employer: [employer]  Tobacco Use  . Smoking status: Never Smoker  . Smokeless tobacco: Never Used  Vaping Use  . Vaping Use: Never used  Substance and Sexual Activity  . Alcohol use: Not Currently    Comment: ocassional  . Drug use: No  . Sexual activity: Yes    Partners: Male    Birth control/protection: I.U.D.  Other Topics Concern  . Not on file  Social History Narrative   Regular exercise. No regular caffeine.   Social Determinants of Health   Financial Resource Strain: Not on file  Food Insecurity: Not on file  Transportation Needs: Not on file  Physical Activity: Not on file  Stress: Not on file  Social Connections: Not on file  Intimate Partner Violence: Not on file    FAMILY HISTORY: Family History  Problem Relation Age of Onset  . Colon cancer Father 35  . Colon polyps Mother   . Hypertension Mother   . Diabetes Paternal Grandmother   . Stroke Paternal Grandmother   . Alcohol abuse Paternal Uncle   . Colon cancer Paternal Aunt 24  . Esophageal cancer Neg Hx   . Rectal cancer Neg Hx   . Stomach cancer Neg Hx    On PMHx the pt reports C-section, Bartholin's Gland Cyst removal. On Social Hx the pt reports non smoker, no alcohol use. On Family Hx the pt reports father passed from colon  cancer, mother has colon polyps.  ALLERGIES:  is allergic to other.  MEDICATIONS:  Current Outpatient Medications  Medication Sig Dispense Refill  . b complex vitamins capsule Take 1 capsule by mouth daily.    . cholecalciferol (VITAMIN D3) 25 MCG (1000 UT) tablet Take 2,000 Units by mouth daily.    . fluticasone (FLONASE) 50 MCG/ACT nasal spray Place 2 sprays into both nostrils daily as needed for allergies or rhinitis.    Marland Kitchen levonorgestrel (MIRENA) 20 MCG/24HR IUD 1 each by Intrauterine route once.    . loratadine (CLARITIN) 10 MG tablet Take 10 mg by mouth daily.    . misoprostol (CYTOTEC) 200 MCG tablet PLACE TWO TABLETS IN THE VAGINA THE NIGHT PRIOR TO YOUR NEXT CLINIC APPOINTMENT 2 tablet 2  . oxyCODONE-acetaminophen (PERCOCET/ROXICET) 5-325 MG tablet TAKE 1 TABLET BY MOUTH EVERY  4 HOURS AS NEEDED FOR SEVERE PAIN FOR UP TO 3 DAYS 15 tablet 0  . Polyvinyl Alcohol-Povidone (REFRESH OP) Place 1 drop into both eyes daily as needed (dry eyes).     No current facility-administered medications for this visit.    REVIEW OF SYSTEMS:   10 Point review of Systems was done is negative except as noted above.  PHYSICAL EXAMINATION: ECOG FS:1 - Symptomatic but completely ambulatory  Vitals:   02/02/21 1233  BP: 131/81  Pulse: 97  Resp: 18  Temp: 97.8 F (36.6 C)  SpO2: 99%   Wt Readings from Last 3 Encounters:  02/02/21 222 lb 14.4 oz (101.1 kg)  01/11/21 215 lb (97.5 kg)  11/03/20 221 lb 12.8 oz (100.6 kg)   Body mass index is 38.26 kg/m.     GENERAL:alert, in no acute distress and comfortable SKIN: no acute rashes, no significant lesions EYES: conjunctiva are pink and non-injected, sclera anicteric OROPHARYNX: MMM, no exudates, no oropharyngeal erythema or ulceration NECK: supple, no JVD LYMPH:  no palpable lymphadenopathy in the cervical, axillary or inguinal regions LUNGS: clear to auscultation b/l with normal respiratory effort HEART: regular rate & rhythm ABDOMEN:   normoactive bowel sounds , non tender, not distended. Extremity: no pedal edema PSYCH: alert & oriented x 3 with fluent speech NEURO: no focal motor/sensory deficits  LABORATORY DATA:  I have reviewed the data as listed  - 05/14/2019 Fe+TIBC+Fer is as follows: Iron at 11, TIBC at 258, % Sat at 4, Ferritin at 50. -Discussed 05/21/2019 Lymph Node Bx [id number]) which revealed " CLASSIC HODGKIN LYMPHOMA."  - 05/19/2019 CT C/A/P ([id number]) which revealed  "1. Enlarged mediastinal, bilateral hilar and lower cervical lymph nodes. Primary considerations include lymphoma, sarcoid and less likely metastatic disease. Normal spleen. No evidence of enlarged lymph nodes within the abdomen or pelvis. 2. Low lying IUD within the LOWER uterine segment and may extend to the cervix."  - 05/17/2019 CT Soft Tissue Neck w/contrast ([id number]) which revealed "Bulky right greater than left cervical and mediastinal lymphadenopathy concerning for lymphoma."  . CBC Latest Ref Rng & Units 02/02/2021 11/03/2020 10/07/2020  WBC 4.0 - 10.5 K/uL 7.4 7.3 14.9(H)  Hemoglobin 12.0 - 15.0 g/dL 13.9 13.9 13.1  Hematocrit 36.0 - 46.0 % 40.7 40.8 38.3  Platelets 150 - 400 K/uL 249 251 272    . CMP Latest Ref Rng & Units 02/02/2021 11/03/2020 10/07/2020  Glucose 70 - 99 mg/dL 82 85 135(H)  BUN 6 - 20 mg/dL 10 12 8   Creatinine 0.44 - 1.00 mg/dL 0.76 0.78 0.85  Sodium 135 - 145 mmol/L 142 139 137  Potassium 3.5 - 5.1 mmol/L 4.3 3.9 3.5  Chloride 98 - 111 mmol/L 104 107 107  CO2 22 - 32 mmol/L 28 22 22   Calcium 8.9 - 10.3 mg/dL 9.5 8.9 8.4(L)  Total Protein 6.5 - 8.1 g/dL 7.4 7.2 -  Total Bilirubin 0.3 - 1.2 mg/dL 0.8 1.4(H) -  Alkaline Phos 38 - 126 U/L 61 62 -  AST 15 - 41 U/L 14(L) 12(L) -  ALT 0 - 44 U/L 13 13 -   . Lab Results  Component Value Date   IRON 79 07/09/2019   TIBC 221 (L) 07/09/2019   IRONPCTSAT 36 07/09/2019   (Iron and TIBC)  Lab Results  Component Value Date   FERRITIN 590 (H) 07/09/2019   08/13/2019 NM PET Image Restag (PS) Skull Base To Thigh (Accession [id number])   05/21/2019 [id number]) Lymph Node Biopsy  RADIOGRAPHIC STUDIES: I have personally reviewed the radiological images as listed and agreed with the findings in the report. IR Removal Tun Access W/ Port W/O [place]  Result Date: 01/11/2021 INDICATION: 40 year old with history of Hodgkin lymphoma. Right subclavian Port-A-Cath was placed by Dr. Dalbert Batman on 06/08/2019. Port-A-Cath is no longer needed and patient presents for port removal. EXAM: REMOVAL OF RIGHT CHEST PORT-A-CATH MEDICATIONS: Moderate sedation ANESTHESIA/SEDATION: Moderate (conscious) sedation was employed during this procedure. A total of Versed 4.0 mg and Fentanyl 100 mcg was administered intravenously. Moderate Sedation Time: 34 minutes. The patient's level of consciousness and vital signs were monitored continuously by radiology nursing throughout the procedure under my direct supervision. FLUOROSCOPY TIME:  None COMPLICATIONS: None immediate. PROCEDURE: Informed written consent was obtained from the patient after a thorough discussion of the procedural risks, benefits and alternatives. All questions were addressed. Maximal Sterile Barrier Technique was utilized including caps, mask, sterile gowns, sterile gloves, sterile drape, hand hygiene and skin antiseptic. A timeout was performed prior to the initiation of the procedure. The right chest was prepped and draped in a sterile fashion. Lidocaine was utilized for local anesthesia. An incision was made over the previously healed surgical incision. Utilizing blunt dissection, the port catheter and reservoir were removed from the underlying subcutaneous tissue in their entirety. Securing sutures were also removed. The pocket was irrigated with a copious amount of sterile normal saline. The subcutaneous tissue was closed with 3-0 Vicryl interrupted subcutaneous stitches. A 4-0 Vicryl running subcuticular stitch was  utilized to approximate the skin. Dermabond was applied. IMPRESSION: Successful removal of the right chest Port-A-Cath. Electronically Signed   By: Markus Daft M.D.   On: 01/11/2021 12:10   10/06/2020 Surgical Pathology   ASSESSMENT & PLAN:   1) Classical Hodgkins lymphoma Likely stage IIIB based on imaging thus far. 2) Microcytic Anemia due to anemia of chronic disease and Iron deficiency- hg improved to 13 3) Jehovah's Witness   PLAN: -Discussed pt labwork today, 02/02/2021; blood counts and chemistries normal. Other labs pending. -Recommended pt receive the second COVID booster shot as recently approved. Advised pt to wait 4-6 months following first booster shot before getting this. -Discussed Evusheld and pt's eligibility. Will send referral. Advised pt to wait around one month after this before getting the second booster shot. -Recommended that the pt continue to eat well, drink at least 48-64 oz of water each day, and walk 20-30 minutes each day.  -Will see back in 4 months with labs.   FOLLOW UP: Ambulatory referral for Evusheld RTC with Dr Irene Limbo with labs in 4 months   The total time spent in the appointment was 20 minutes and more than 50% was on counseling and direct patient cares,   All of the patient's questions were answered with apparent satisfaction. The patient knows to call the clinic with any problems, questions or concerns.   Sullivan Lone MD [place] AAHIVMS [facility] Hematology/Oncology Physician [facility]  (Office):       [phone] (Work cell):  [phone] (Fax):           [phone]  02/02/2021 1:09 PM  I, Reinaldo Raddle, am acting as scribe for Dr. Sullivan Lone, MD.      .I have reviewed the above documentation for accuracy and completeness, and I agree with the above. Brunetta Genera MD

## 2021-02-02 ENCOUNTER — Other Ambulatory Visit: Payer: 59

## 2021-02-02 ENCOUNTER — Inpatient Hospital Stay: Payer: 59 | Attending: Hematology | Admitting: Hematology

## 2021-02-02 ENCOUNTER — Other Ambulatory Visit: Payer: Self-pay

## 2021-02-02 ENCOUNTER — Inpatient Hospital Stay: Payer: 59

## 2021-02-02 VITALS — BP 131/81 | HR 97 | Temp 97.8°F | Resp 18 | Ht 64.0 in | Wt 222.9 lb

## 2021-02-02 DIAGNOSIS — C8110 Nodular sclerosis classical Hodgkin lymphoma, unspecified site: Secondary | ICD-10-CM

## 2021-02-02 DIAGNOSIS — Z8571 Personal history of Hodgkin lymphoma: Secondary | ICD-10-CM | POA: Diagnosis not present

## 2021-02-02 DIAGNOSIS — C8111 Nodular sclerosis classical Hodgkin lymphoma, lymph nodes of head, face, and neck: Secondary | ICD-10-CM

## 2021-02-02 DIAGNOSIS — Z9221 Personal history of antineoplastic chemotherapy: Secondary | ICD-10-CM | POA: Insufficient documentation

## 2021-02-02 DIAGNOSIS — D509 Iron deficiency anemia, unspecified: Secondary | ICD-10-CM | POA: Diagnosis not present

## 2021-02-02 LAB — CMP (CANCER CENTER ONLY)
ALT: 13 U/L (ref 0–44)
AST: 14 U/L — ABNORMAL LOW (ref 15–41)
Albumin: 3.9 g/dL (ref 3.5–5.0)
Alkaline Phosphatase: 61 U/L (ref 38–126)
Anion gap: 10 (ref 5–15)
BUN: 10 mg/dL (ref 6–20)
CO2: 28 mmol/L (ref 22–32)
Calcium: 9.5 mg/dL (ref 8.9–10.3)
Chloride: 104 mmol/L (ref 98–111)
Creatinine: 0.76 mg/dL (ref 0.44–1.00)
GFR, Estimated: >60 mL/min (ref >60)
Glucose, Bld: 82 mg/dL (ref 70–99)
Potassium: 4.3 mmol/L (ref 3.5–5.1)
Sodium: 142 mmol/L (ref 135–145)
Total Bilirubin: 0.8 mg/dL (ref 0.3–1.2)
Total Protein: 7.4 g/dL (ref 6.5–8.1)

## 2021-02-02 LAB — SEDIMENTATION RATE: Sed Rate: 18 mm/hr (ref 0–22)

## 2021-02-02 LAB — CBC WITH DIFFERENTIAL/PLATELET
Abs Immature Granulocytes: 0.02 10*3/uL (ref 0.00–0.07)
Basophils Absolute: 0 10*3/uL (ref 0.0–0.1)
Basophils Relative: 0 %
Eosinophils Absolute: 0.2 10*3/uL (ref 0.0–0.5)
Eosinophils Relative: 2 %
HCT: 40.7 % (ref 36.0–46.0)
Hemoglobin: 13.9 g/dL (ref 12.0–15.0)
Immature Granulocytes: 0 %
Lymphocytes Relative: 41 %
Lymphs Abs: 3 10*3/uL (ref 0.7–4.0)
MCH: 32.8 pg (ref 26.0–34.0)
MCHC: 34.2 g/dL (ref 30.0–36.0)
MCV: 96 fL (ref 80.0–100.0)
Monocytes Absolute: 0.5 10*3/uL (ref 0.1–1.0)
Monocytes Relative: 7 %
Neutro Abs: 3.7 10*3/uL (ref 1.7–7.7)
Neutrophils Relative %: 50 %
Platelets: 249 10*3/uL (ref 150–400)
RBC: 4.24 MIL/uL (ref 3.87–5.11)
RDW: 12 % (ref 11.5–15.5)
WBC: 7.4 10*3/uL (ref 4.0–10.5)
nRBC: 0 % (ref 0.0–0.2)

## 2021-02-02 LAB — IRON AND TIBC
Iron: 50 ug/dL (ref 41–142)
Saturation Ratios: 18 % — ABNORMAL LOW (ref 21–57)
TIBC: 278 ug/dL (ref 236–444)
UIBC: 228 ug/dL (ref 120–384)

## 2021-02-02 LAB — FERRITIN: Ferritin: 225 ng/mL (ref 11–307)

## 2021-02-05 ENCOUNTER — Telehealth: Payer: Self-pay | Admitting: Hematology

## 2021-02-05 NOTE — Telephone Encounter (Signed)
Scheduled follow-up appointment per 5/20 los. Patient is aware. 

## 2021-02-08 ENCOUNTER — Encounter: Payer: Self-pay | Admitting: Hematology

## 2021-02-14 ENCOUNTER — Telehealth: Payer: Self-pay | Admitting: Adult Health

## 2021-02-14 NOTE — Telephone Encounter (Signed)
I called patient to discuss Evusheld, a long acting monoclonal antibody injection administered to patients with decreased immune systems or intolerance/allergy to the COVID 19 vaccine as COVID19 prevention.    Unable to reach patient.  LMOM to return my call.  Bach Rocchi, NP  

## 2021-03-08 ENCOUNTER — Encounter: Payer: Self-pay | Admitting: Hematology

## 2021-04-09 ENCOUNTER — Other Ambulatory Visit (HOSPITAL_COMMUNITY): Payer: Self-pay

## 2021-04-09 ENCOUNTER — Other Ambulatory Visit: Payer: Self-pay | Admitting: Hematology

## 2021-04-10 ENCOUNTER — Encounter: Payer: Self-pay | Admitting: Hematology

## 2021-04-10 ENCOUNTER — Other Ambulatory Visit (HOSPITAL_COMMUNITY): Payer: Self-pay

## 2021-04-13 ENCOUNTER — Other Ambulatory Visit (HOSPITAL_COMMUNITY): Payer: Self-pay

## 2021-04-13 ENCOUNTER — Other Ambulatory Visit: Payer: Self-pay | Admitting: Hematology

## 2021-04-13 DIAGNOSIS — C8111 Nodular sclerosis classical Hodgkin lymphoma, lymph nodes of head, face, and neck: Secondary | ICD-10-CM

## 2021-04-13 MED ORDER — CITALOPRAM HYDROBROMIDE 10 MG PO TABS
ORAL_TABLET | Freq: Every day | ORAL | 1 refills | Status: DC
  Filled 2021-04-13: qty 30, 30d supply, fill #0
  Filled 2021-06-13: qty 30, 30d supply, fill #1

## 2021-04-13 NOTE — Progress Notes (Signed)
PA obtained for PET scan

## 2021-04-13 NOTE — Progress Notes (Unsigned)
Pet

## 2021-04-23 ENCOUNTER — Other Ambulatory Visit: Payer: Self-pay | Admitting: Thoracic Surgery (Cardiothoracic Vascular Surgery)

## 2021-04-23 ENCOUNTER — Telehealth: Payer: Self-pay | Admitting: *Deleted

## 2021-04-23 DIAGNOSIS — Z9889 Other specified postprocedural states: Secondary | ICD-10-CM

## 2021-04-23 NOTE — Telephone Encounter (Signed)
Patient called - Started new job, has been asked to have MMR. She states her antibodies were tested and not present/low. She wants to know if ok to have MMR  at this time.  Last chemo infusion 11/2019.  Requested response from Dr. Lorenso Courier in Dr. Grier Mitts absence.  Contacted patient with his response: "Last chemo was > 1 year ago, immune system seems intact based off last labs. I would be OK with proceeding with MMR vaccine." Patient verbalized understanding of information

## 2021-04-24 ENCOUNTER — Other Ambulatory Visit: Payer: Self-pay

## 2021-04-24 ENCOUNTER — Ambulatory Visit: Payer: 59 | Admitting: Thoracic Surgery (Cardiothoracic Vascular Surgery)

## 2021-04-24 ENCOUNTER — Ambulatory Visit
Admission: RE | Admit: 2021-04-24 | Discharge: 2021-04-24 | Disposition: A | Discharge status: Home / Self Care | Payer: 59 | Source: Ambulatory Visit | Attending: Thoracic Surgery (Cardiothoracic Vascular Surgery) | Admitting: Thoracic Surgery (Cardiothoracic Vascular Surgery)

## 2021-04-24 VITALS — BP 117/76 | HR 94 | Resp 20 | Wt 225.0 lb

## 2021-04-24 DIAGNOSIS — R590 Localized enlarged lymph nodes: Secondary | ICD-10-CM | POA: Diagnosis not present

## 2021-04-24 DIAGNOSIS — J986 Disorders of diaphragm: Secondary | ICD-10-CM | POA: Diagnosis not present

## 2021-04-24 DIAGNOSIS — Z9889 Other specified postprocedural states: Secondary | ICD-10-CM

## 2021-04-24 NOTE — Progress Notes (Signed)
[address]       ,[place] [address]             [phone]      HPI: Elizabeth Beasley returns for a scheduled follow-up visit  Elizabeth Beasley is a 40 year old woman with a history of stage III nodular sclerosing Hodgkin's lymphoma.  Earlier this year she had a follow-up PET/CT and there was some anterior mediastinal adenopathy with some residual metabolic activity.  Was unclear if this was residual treated disease or active disease.  She was referred for a lymph node biopsy.  I did a robotic assisted left thoracoscopy for biopsy of her anterior mediastinal nodes in January 2022.  She went home on day 1.  The biopsy showed fibrosis but no active lymphoma.  I saw her back in the office in February and she was having some shortness of breath when walking upstairs.  Her chest x-ray showed that her left hemidiaphragm was mildly elevated.  In the interim since her last visit she has been feeling well.  She denies any change in appetite or weight loss she is not having any shortness of breath at rest or with exertion.  Overall she feels well.  She has a follow-up PET later this week and sees Dr. Irene Limbo soon.  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     cholecalciferol (VITAMIN D3) 25 MCG (1000 UT) tablet Take 2,000 Units by mouth daily.     citalopram (CELEXA) 10 MG tablet TAKE 1 TABLET BY MOUTH DAILY 30 tablet 1   fluticasone (FLONASE) 50 MCG/ACT nasal spray Place 2 sprays into both nostrils daily as needed for allergies or rhinitis. (Patient  not taking: Reported on 04/24/2021)     levonorgestrel (MIRENA) 20 MCG/24HR IUD 1 each by Intrauterine route once.     loratadine (CLARITIN) 10 MG tablet Take 10 mg by mouth daily. (Patient not taking: Reported on 04/24/2021)     misoprostol (CYTOTEC) 200 MCG tablet PLACE TWO TABLETS IN THE VAGINA THE NIGHT PRIOR TO YOUR NEXT CLINIC APPOINTMENT 2 tablet 2   Polyvinyl Alcohol-Povidone (REFRESH OP) Place 1 drop into both eyes daily as needed (dry eyes). (Patient not taking: Reported on 04/24/2021)     No current facility-administered medications for this visit.    Physical Exam BP 117/76 (BP Location: Right Arm, Patient Position: Sitting)   Pulse 94   Resp 20   Wt 225 lb (102.1 kg)   SpO2 94%   BMI 38.63 kg/m  40 year old woman in no acute distress Alert and oriented x3 with no focal deficits Lungs diminished at left base but otherwise clear Incisions well-healed No cervical or supraclavicular adenopathy  Diagnostic Tests: I personally reviewed her chest x-ray images.  There is still some mild elevation of the left hemidiaphragm.  Impression: Elizabeth Beasley is a 40 year old woman with a history of nodular sclerosing Hodgkin's lymphoma.  She had a robotic assisted biopsy of some anterior mediastinal/AP window nodes in January of this  year.  She had some mild elevation of left hemidiaphragm postoperatively.  The elevation of the diaphragm persist but symptomatically she is back to her baseline respiratory status and is not having any issues with dyspnea.  No further work-up of that is needed.  Plan: Follow-up with Dr. Irene Limbo I will be happy to see her back anytime in the future if I can be of any further assistance with her care  [name], MD [facility] [phone]

## 2021-04-26 ENCOUNTER — Other Ambulatory Visit: Payer: Self-pay

## 2021-04-26 ENCOUNTER — Ambulatory Visit (HOSPITAL_COMMUNITY)
Admission: RE | Admit: 2021-04-26 | Discharge: 2021-04-26 | Disposition: A | Discharge status: Home / Self Care | Payer: 59 | Source: Ambulatory Visit | Attending: Hematology | Admitting: Hematology

## 2021-04-26 DIAGNOSIS — C8590 Non-Hodgkin lymphoma, unspecified, unspecified site: Secondary | ICD-10-CM | POA: Diagnosis not present

## 2021-04-26 DIAGNOSIS — C8111 Nodular sclerosis classical Hodgkin lymphoma, lymph nodes of head, face, and neck: Secondary | ICD-10-CM | POA: Insufficient documentation

## 2021-04-26 LAB — GLUCOSE, CAPILLARY: Glucose-Capillary: 92 mg/dL (ref 70–99)

## 2021-04-26 MED ORDER — FLUDEOXYGLUCOSE F - 18 (FDG) INJECTION
12.5000 | Freq: Once | INTRAVENOUS | Status: AC | PRN
  Administered 2021-04-26: 11.1 via INTRAVENOUS

## 2021-06-01 ENCOUNTER — Encounter: Payer: Self-pay | Admitting: Hematology

## 2021-06-04 ENCOUNTER — Other Ambulatory Visit: Payer: Self-pay

## 2021-06-04 DIAGNOSIS — C8111 Nodular sclerosis classical Hodgkin lymphoma, lymph nodes of head, face, and neck: Secondary | ICD-10-CM

## 2021-06-05 ENCOUNTER — Inpatient Hospital Stay: Payer: No Typology Code available for payment source | Attending: Hematology | Admitting: Hematology

## 2021-06-05 ENCOUNTER — Inpatient Hospital Stay: Payer: No Typology Code available for payment source

## 2021-06-05 ENCOUNTER — Encounter: Payer: Self-pay | Admitting: Hematology

## 2021-06-05 ENCOUNTER — Other Ambulatory Visit: Payer: Self-pay

## 2021-06-05 VITALS — BP 117/73 | HR 69 | Temp 97.8°F | Resp 17 | Ht 64.0 in | Wt 223.5 lb

## 2021-06-05 DIAGNOSIS — C8111 Nodular sclerosis classical Hodgkin lymphoma, lymph nodes of head, face, and neck: Secondary | ICD-10-CM

## 2021-06-05 DIAGNOSIS — Z808 Family history of malignant neoplasm of other organs or systems: Secondary | ICD-10-CM | POA: Diagnosis not present

## 2021-06-05 DIAGNOSIS — D509 Iron deficiency anemia, unspecified: Secondary | ICD-10-CM | POA: Diagnosis not present

## 2021-06-05 DIAGNOSIS — C8190 Hodgkin lymphoma, unspecified, unspecified site: Secondary | ICD-10-CM | POA: Insufficient documentation

## 2021-06-05 LAB — CBC WITH DIFFERENTIAL (CANCER CENTER ONLY)
Abs Immature Granulocytes: 0.03 10*3/uL (ref 0.00–0.07)
Basophils Absolute: 0 10*3/uL (ref 0.0–0.1)
Basophils Relative: 0 %
Eosinophils Absolute: 0.2 10*3/uL (ref 0.0–0.5)
Eosinophils Relative: 2 %
HCT: 38.6 % (ref 36.0–46.0)
Hemoglobin: 13 g/dL (ref 12.0–15.0)
Immature Granulocytes: 0 %
Lymphocytes Relative: 39 %
Lymphs Abs: 3.1 10*3/uL (ref 0.7–4.0)
MCH: 31.9 pg (ref 26.0–34.0)
MCHC: 33.7 g/dL (ref 30.0–36.0)
MCV: 94.8 fL (ref 80.0–100.0)
Monocytes Absolute: 0.3 10*3/uL (ref 0.1–1.0)
Monocytes Relative: 4 %
Neutro Abs: 4.3 10*3/uL (ref 1.7–7.7)
Neutrophils Relative %: 55 %
Platelet Count: 287 10*3/uL (ref 150–400)
RBC: 4.07 MIL/uL (ref 3.87–5.11)
RDW: 12.2 % (ref 11.5–15.5)
WBC Count: 7.9 10*3/uL (ref 4.0–10.5)
nRBC: 0 % (ref 0.0–0.2)

## 2021-06-05 LAB — IRON AND TIBC
Iron: 77 ug/dL (ref 41–142)
Saturation Ratios: 28 % (ref 21–57)
TIBC: 276 ug/dL (ref 236–444)
UIBC: 200 ug/dL (ref 120–384)

## 2021-06-05 LAB — CMP (CANCER CENTER ONLY)
ALT: 14 U/L (ref 0–44)
AST: 11 U/L — ABNORMAL LOW (ref 15–41)
Albumin: 4 g/dL (ref 3.5–5.0)
Alkaline Phosphatase: 59 U/L (ref 38–126)
Anion gap: 9 (ref 5–15)
BUN: 9 mg/dL (ref 6–20)
CO2: 28 mmol/L (ref 22–32)
Calcium: 9.3 mg/dL (ref 8.9–10.3)
Chloride: 106 mmol/L (ref 98–111)
Creatinine: 0.77 mg/dL (ref 0.44–1.00)
GFR, Estimated: >60 mL/min (ref >60)
Glucose, Bld: 86 mg/dL (ref 70–99)
Potassium: 4.1 mmol/L (ref 3.5–5.1)
Sodium: 143 mmol/L (ref 135–145)
Total Bilirubin: 1.1 mg/dL (ref 0.3–1.2)
Total Protein: 7.4 g/dL (ref 6.5–8.1)

## 2021-06-05 LAB — SEDIMENTATION RATE: Sed Rate: 15 mm/hr (ref 0–22)

## 2021-06-05 LAB — FERRITIN: Ferritin: 45 ng/mL (ref 11–307)

## 2021-06-06 ENCOUNTER — Telehealth: Payer: Self-pay | Admitting: Hematology

## 2021-06-06 NOTE — Telephone Encounter (Signed)
Left message with follow-up appointment per 9/20 los. 

## 2021-06-11 ENCOUNTER — Encounter: Payer: Self-pay | Admitting: Hematology

## 2021-06-11 NOTE — Progress Notes (Signed)
HEMATOLOGY/ONCOLOGY CLINIC NOTE  Date of Service: .06/05/2021  Patient Care Team: [name], MD as PCP - General (Family Medicine)  CHIEF COMPLAINTS/PURPOSE OF CONSULTATION:   Continue management of classical hodgkins lymphoma   CURRENT TREATMENT: Active surveillance   HISTORY OF PRESENTING ILLNESS:   Elizabeth Beasley is a wonderful 40 y.o. female who has been referred to [place] by Dr Madilyn Fireman for evaluation and management of microcytic anemia/cervical lymphadenopathy.The pt reports that she has a history of iron deficiency but has never had a transfusion. Pt has had two pregnancies and has two children, ages 93 and 34. There was a concern for anemia with her second pregnancy so she was given an erythropoietin shot. She has had a copper IUD for about 2 years and her menstrual cycle has been slightly longer since getting the IUD. She has been taking 28 mg TID PO iron since June. Pt has no known medication allergies and has had a previous removal of Bartholin's gland cyst. Pt has had no known contact with chemicals either through work or hobbies. She has no history of asthma or heavy smoke/aerosol exposure.   Pt did not notice any cervical lymphadenopathy in June. In late July, early August she began to experience extreme fatigue and could begin to feel small lumps in her neck. Her fatigue is improved with taking her PO Iron TID. She has experienced a couple of episodes of night sweats, with the latest occurring in late August. Pt has also had <10 lbs loss in 2 months. She has not had any rashes but is experiencing itching in her palms, broader itching after hot showers.   Pt is currently working from home. She has had her seasonal flu shot. Pt is not planning on having more children and is not concerned about fertility. Pt's husband would like to take a drive to the [facility] next month. Pt would not want any blood product transfusions, as she is a Jehovah's Witness. She  is comfortable with IV iron infusions.   INTERVAL HISTORY:  Elizabeth Beasley is a 40 y.o. female here for evaluation and management of Hodgkins Lymphoma. Her final chemotherapy treatment was on 11/19/2019. The patient's last visit with [place] was on 02/02/2021. The pt reports that she is doing well overall.  Patient notes no acute new symptoms.  No fevers no chills no night sweats no lumps or bumps  Lab results today 06/05/2021 of CBC w/diff and CMP is unremarkable  06/05/2021 Ferritin 45 with iron saturation of 28% 06/05/2021 Sedimentation rate wnl at 15  PET CT scan on 04/26/2021 shows no new or progressive disease.  On review of systems, pt reports no other acute new symptoms.   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   Pt is scheduled for surgery       SURGICAL HISTORY: Past Surgical History:  Procedure Laterality Date   CESAREAN SECTION  2010   INTERCOSTAL NERVE BLOCK  10/06/2020   Procedure: INTERCOSTAL NERVE BLOCK;  Surgeon: [name], MD;  Location: [facility];  Service: Thoracic;;   IR REMOVAL TUN ACCESS W/ PORT W/O FL MOD SED  01/11/2021   LYMPH NODE  BIOPSY Left 10/06/2020   Procedure: MEDIASTINAL LYMPH NODE BIOPSY;  Surgeon: [name], MD;  Location: [facility];  Service: Thoracic;  Laterality: Left;   MASS EXCISION Right 05/21/2019   Procedure: EXCISION OF RIGHT DEEP NECK LYMPH NODE;  Surgeon: Fanny Skates, MD;  Location: [facility];  Service: General;  Laterality: Right;   PORTACATH PLACEMENT Right 06/08/2019   Procedure: INSERTION PORT-A-CATH WITH ULTRASOUND GUIDANCE;  Surgeon: Fanny Skates, MD;  Location: [facility];  Service: General;  Laterality: Right;    URETHRAL CYST REMOVAL N/A 08/30/2016   Procedure: BARTHOLIN'S GLAND CYST EXCISION;  Surgeon: Ardis Hughs, MD;  Location: [facility];  Service: Urology;  Laterality: N/A;   v back     2012    SOCIAL HISTORY: Social History   Socioeconomic History   Marital status: Married    Spouse name: Derrick   Number of children: 2   Years of education: Assoc   Highest education level: Not on file  Occupational History   Occupation: CMA    Employer: [employer]  Tobacco Use   Smoking status: Never   Smokeless tobacco: Never  Vaping Use   Vaping Use: Never used  Substance and Sexual Activity   Alcohol use: Not Currently    Comment: ocassional   Drug use: No   Sexual activity: Yes    Partners: Male    Birth control/protection: I.U.D.  Other Topics Concern   Not on file  Social History Narrative   Regular exercise. No regular caffeine.   Social Determinants of Health   Financial Resource Strain: Not on file  Food Insecurity: Not on file  Transportation Needs: Not on file  Physical Activity: Not on file  Stress: Not on file  Social Connections: Not on file  Intimate Partner Violence: Not on file    FAMILY HISTORY: Family History  Problem Relation Age of Onset   Colon cancer Father 46   Colon polyps Mother    Hypertension Mother    Diabetes Paternal Grandmother    Stroke Paternal Grandmother    Alcohol abuse Paternal Uncle    Colon cancer Paternal Aunt 57   Esophageal cancer Neg Hx    Rectal cancer Neg Hx    Stomach cancer Neg Hx    On PMHx the pt reports C-section, Bartholin's Gland Cyst removal. On Social Hx the pt reports non smoker, no alcohol use. On Family Hx the pt reports father passed from colon cancer, mother has colon polyps.  ALLERGIES:  is allergic to other.  MEDICATIONS:  Current Outpatient Medications  Medication Sig Dispense Refill   b complex vitamins capsule Take 1 capsule by mouth daily.     cholecalciferol (VITAMIN D3) 25  MCG (1000 UT) tablet Take 2,000 Units by mouth daily.     citalopram (CELEXA) 10 MG tablet TAKE 1 TABLET BY MOUTH DAILY 30 tablet 1   fluticasone (FLONASE) 50 MCG/ACT nasal spray Place 2 sprays into both nostrils daily as needed for allergies or rhinitis. (Patient not taking: Reported on 04/24/2021)     levonorgestrel (MIRENA) 20 MCG/24HR IUD 1 each by Intrauterine route once.     loratadine (CLARITIN) 10 MG tablet Take 10 mg by mouth daily. (Patient not taking: Reported on 04/24/2021)     misoprostol (CYTOTEC) 200 MCG tablet PLACE TWO TABLETS IN THE VAGINA THE NIGHT PRIOR TO YOUR NEXT CLINIC APPOINTMENT 2 tablet 2   Polyvinyl Alcohol-Povidone (REFRESH OP) Place 1 drop into both eyes daily as needed (dry  eyes). (Patient not taking: Reported on 04/24/2021)     No current facility-administered medications for this visit.    REVIEW OF SYSTEMS:   .10 Point review of Systems was done is negative except as noted above.   PHYSICAL EXAMINATION: ECOG FS:1 - Symptomatic but completely ambulatory  Vitals:   06/05/21 1022  BP: 117/73  Pulse: 69  Resp: 17  Temp: 97.8 F (36.6 C)  SpO2: 100%   Wt Readings from Last 3 Encounters:  06/05/21 223 lb 8 oz (101.4 kg)  04/24/21 225 lb (102.1 kg)  02/02/21 222 lb 14.4 oz (101.1 kg)   Body mass index is 38.36 kg/m.    Marland Kitchen GENERAL:alert, in no acute distress and comfortable SKIN: no acute rashes, no significant lesions EYES: conjunctiva are pink and non-injected, sclera anicteric OROPHARYNX: MMM, no exudates, no oropharyngeal erythema or ulceration NECK: supple, no JVD LYMPH:  no palpable lymphadenopathy in the cervical, axillary or inguinal regions LUNGS: clear to auscultation b/l with normal respiratory effort HEART: regular rate & rhythm ABDOMEN:  normoactive bowel sounds , non tender, not distended. Extremity: no pedal edema PSYCH: alert & oriented x 3 with fluent speech NEURO: no focal motor/sensory deficits   LABORATORY DATA:  I have  reviewed the data as listed . CBC Latest Ref Rng & Units 06/05/2021 02/02/2021 11/03/2020  WBC 4.0 - 10.5 K/uL 7.9 7.4 7.3  Hemoglobin 12.0 - 15.0 g/dL 13.0 13.9 13.9  Hematocrit 36.0 - 46.0 % 38.6 40.7 40.8  Platelets 150 - 400 K/uL 287 249 251    . CMP Latest Ref Rng & Units 06/05/2021 02/02/2021 11/03/2020  Glucose 70 - 99 mg/dL 86 82 85  BUN 6 - 20 mg/dL 9 10 12   Creatinine 0.44 - 1.00 mg/dL 0.77 0.76 0.78  Sodium 135 - 145 mmol/L 143 142 139  Potassium 3.5 - 5.1 mmol/L 4.1 4.3 3.9  Chloride 98 - 111 mmol/L 106 104 107  CO2 22 - 32 mmol/L 28 28 22   Calcium 8.9 - 10.3 mg/dL 9.3 9.5 8.9  Total Protein 6.5 - 8.1 g/dL 7.4 7.4 7.2  Total Bilirubin 0.3 - 1.2 mg/dL 1.1 0.8 1.4(H)  Alkaline Phos 38 - 126 U/L 59 61 62  AST 15 - 41 U/L 11(L) 14(L) 12(L)  ALT 0 - 44 U/L 14 13 13    . Lab Results  Component Value Date   IRON 77 06/05/2021   TIBC 276 06/05/2021   IRONPCTSAT 28 06/05/2021   (Iron and TIBC)  Lab Results  Component Value Date   FERRITIN 45 06/05/2021  08/13/2019 NM PET Image Restag (PS) Skull Base To Thigh (Accession [id number])   05/21/2019 ([id number]) Lymph Node Biopsy    RADIOGRAPHIC STUDIES: I have personally reviewed the radiological images as listed and agreed with the findings in the report. No results found.  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 2) Microcytic Anemia due to anemia of chronic disease and Iron deficiency- hg improved to 13 3) Jehovah's Witness   PLAN: -Discussed pt labwork today, 06/05/2021; blood counts and chemistries normal. Sed rate wnl Ferritin 45 with iron saturation of 28% -PET/CT with no new or progressive CHL -recommend getting flu shot and covid 19 new booster vaccination. -Will see back in 4 months with labs.  FOLLOW UP: RTC with Dr Irene Limbo with labs in 4 months  . The total time spent in the appointment was 20 minutes and more than  50% was on counseling and direct patient cares.  All of the patient's questions were answered with apparent satisfaction. The patient knows to call the clinic with any problems, questions or concerns.   Sullivan Lone MD [place] AAHIVMS [facility] Hematology/Oncology Physician [facility]

## 2021-06-13 ENCOUNTER — Encounter: Payer: Self-pay | Admitting: Hematology

## 2021-06-13 ENCOUNTER — Other Ambulatory Visit (HOSPITAL_COMMUNITY): Payer: Self-pay

## 2021-07-02 ENCOUNTER — Encounter: Payer: Self-pay | Admitting: Hematology

## 2021-07-16 ENCOUNTER — Encounter: Payer: Self-pay | Admitting: Hematology

## 2021-07-16 ENCOUNTER — Other Ambulatory Visit: Payer: Self-pay | Admitting: Hematology

## 2021-07-16 ENCOUNTER — Telehealth: Payer: No Typology Code available for payment source | Admitting: Family Medicine

## 2021-07-16 ENCOUNTER — Other Ambulatory Visit (HOSPITAL_COMMUNITY): Payer: Self-pay

## 2021-07-16 DIAGNOSIS — U071 COVID-19: Secondary | ICD-10-CM

## 2021-07-16 DIAGNOSIS — J069 Acute upper respiratory infection, unspecified: Secondary | ICD-10-CM | POA: Diagnosis not present

## 2021-07-16 MED ORDER — MOLNUPIRAVIR EUA 200MG CAPSULE
4.0000 | ORAL_CAPSULE | Freq: Two times a day (BID) | ORAL | 0 refills | Status: AC
  Filled 2021-07-16: qty 40, 5d supply, fill #0

## 2021-07-16 MED ORDER — PROMETHAZINE-DM 6.25-15 MG/5ML PO SYRP
2.5000 mL | ORAL_SOLUTION | Freq: Four times a day (QID) | ORAL | 0 refills | Status: DC | PRN
Start: 2021-07-16 — End: 2022-02-01
  Filled 2021-07-16: qty 118, 12d supply, fill #0

## 2021-07-16 MED ORDER — BENZONATATE 100 MG PO CAPS
100.0000 mg | ORAL_CAPSULE | Freq: Two times a day (BID) | ORAL | 0 refills | Status: DC | PRN
  Filled 2021-07-16: qty 20, 10d supply, fill #0

## 2021-07-16 NOTE — Progress Notes (Signed)
Virtual Visit Consent   Elizabeth Beasley, you are scheduled for a virtual visit with a [facility] provider today.     Just as with appointments in the office, your consent must be obtained to participate.  Your consent will be active for this visit and any virtual visit you may have with one of our providers in the next 365 days.     If you have a MyChart account, a copy of this consent can be sent to you electronically.  All virtual visits are billed to your insurance company just like a traditional visit in the office.    As this is a virtual visit, video technology does not allow for your provider to perform a traditional examination.  This may limit your provider's ability to fully assess your condition.  If your provider identifies any concerns that need to be evaluated in person or the need to arrange testing (such as labs, EKG, etc.), we will make arrangements to do so.     Although advances in technology are sophisticated, we cannot ensure that it will always work on either your end or our end.  If the connection with a video visit is poor, the visit may have to be switched to a telephone visit.  With either a video or telephone visit, we are not always able to ensure that we have a secure connection.     I need to obtain your verbal consent now.   Are you willing to proceed with your visit today?    Elizabeth Beasley has provided verbal consent on 07/16/2021 for a virtual visit (video or telephone).   Perlie Mayo, NP   Date: 07/16/2021 9:04 AM   Virtual Visit via Video Note   I, Perlie Mayo, connected with  Elizabeth Beasley  ([id number], [birth year]) on 07/16/21 at  9:00 AM EDT by a video-enabled telemedicine application and verified that I am speaking with the correct person using two identifiers.  Location: Patient: Virtual Visit Location Patient: Home Provider: Virtual Visit Location Provider: Home Office   I discussed the limitations of evaluation and management  by telemedicine and the availability of in person appointments. The patient expressed understanding and agreed to proceed.    History of Present Illness: Elizabeth Beasley is a 40 y.o. who identifies as a female who was assigned female at birth, and is being seen today for was on a cruise last wee- go home Saturday. Symptoms started Sunday- HT + COVID that night. Today Monday- cough, body aches, feverish- no temp starting, nasal drainage, no ear or sore throat pain. Denies shortness of breath or chest pain. Muscle pain in chest wall with coughing.   History of lymphoma got- chemo completed 11/2019. Desire antiviral with Hx.   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   Iron deficiency anemia due to chronic blood loss 06/01/2019   Lymphadenopathy, cervical 05/21/2019   Family history of colon cancer in father 07/10/2017   Obesity 04/25/2016   Abnormal weight gain 04/25/2016   Cervical paraspinal muscle spasm 03/11/2016   Urinary urgency 11/12/2007   Headache 11/11/2007   Chronic rhinitis 11/11/2007    Allergies:  Allergies  Allergen Reactions   Other     NO BLOOD TRANSFUSIONS; PT WILL ACCEPT ALBUMIN OR  ALBUMIN CONTAINING PRODUCTS   Medications:  Current Outpatient Medications:    b complex vitamins capsule, Take 1 capsule by mouth daily., Disp: , Rfl:    cholecalciferol (VITAMIN D3) 25 MCG (1000 UT) tablet, Take 2,000 Units by mouth daily., Disp: , Rfl:    citalopram (CELEXA) 10 MG tablet, TAKE 1 TABLET BY MOUTH DAILY, Disp: 30 tablet, Rfl: 1   fluticasone (FLONASE) 50 MCG/ACT nasal spray, Place 2 sprays into both nostrils daily as needed for allergies or rhinitis. (Patient not taking: Reported on 04/24/2021), Disp: , Rfl:    levonorgestrel (MIRENA)  20 MCG/24HR IUD, 1 each by Intrauterine route once., Disp: , Rfl:    loratadine (CLARITIN) 10 MG tablet, Take 10 mg by mouth daily. (Patient not taking: Reported on 04/24/2021), Disp: , Rfl:    misoprostol (CYTOTEC) 200 MCG tablet, PLACE TWO TABLETS IN THE VAGINA THE NIGHT PRIOR TO YOUR NEXT CLINIC APPOINTMENT, Disp: 2 tablet, Rfl: 2   Polyvinyl Alcohol-Povidone (REFRESH OP), Place 1 drop into both eyes daily as needed (dry eyes). (Patient not taking: Reported on 04/24/2021), Disp: , Rfl:   Observations/Objective: Patient is well-developed, well-nourished in no acute distress.  Resting comfortably  at home.  Head is normocephalic, atraumatic.  No labored breathing.  Speech is clear and coherent with logical content.  Patient is alert and oriented at baseline.  Cough present  Assessment and Plan: 1. COVID-19 S&S of covid and + HT  OTC reviewed and then on AVS as well  Antiviral provided  Cough medication provided  No work note needed    - molnupiravir EUA (LAGEVRIO) 200 mg CAPS capsule; Take 4 capsules (800 mg total) by mouth 2 (two) times daily for 5 days.  Dispense: 40 capsule; Refill: 0  2. Upper respiratory infection with cough and congestion  - promethazine-dextromethorphan (PROMETHAZINE-DM) 6.25-15 MG/5ML syrup; Take 2.5 mLs by mouth 4 (four) times daily as needed for cough.  Dispense: 118 mL; Refill: 0 - benzonatate (TESSALON) 100 MG capsule; Take 1 capsule (100 mg total) by mouth 2 (two) times daily as needed for cough.  Dispense: 20 capsule; Refill: 0   Reviewed side effects, risks and benefits of medication.    Patient acknowledged agreement and understanding of the plan.   I discussed the assessment and treatment plan with the patient. The patient was provided an opportunity to ask questions and all were answered. The patient agreed with the plan and demonstrated an understanding of the instructions.   The patient was advised to call back or seek an in-person evaluation  if the symptoms worsen or if the condition fails to improve as anticipated.   The above assessment and management plan was discussed with the patient. The patient verbalized understanding of and has agreed to the management plan. Patient is aware to call the clinic if symptoms persist or worsen. Patient is aware when to return to the clinic for a follow-up visit. Patient educated on when it is appropriate to go to the emergency department.   Follow Up Instructions: I discussed the assessment and treatment plan with the patient. The patient was provided an opportunity to ask questions and all were answered. The patient agreed with the plan and demonstrated an understanding of the instructions.  A copy of instructions were sent to the patient via MyChart unless otherwise noted below.     The patient was advised to call back or seek an in-person evaluation if the symptoms worsen or if the condition fails to improve as anticipated.  Time:  I spent 10 minutes with the patient via telehealth technology discussing the above problems/concerns.    Perlie Mayo, NP

## 2021-07-16 NOTE — Patient Instructions (Signed)
I appreciate the opportunity to provide you with care for your health and wellness.  Take medication as need  Please continue to practice social distancing to keep you, your family, and our community safe.  If you must go out, please wear a mask and practice good handwashing.  Have a wonderful day. With [facility]atitude, Elizabeth Beach, DNP, AGNP-BC   Please keep well-hydrated and get plenty of rest. Start a saline nasal rinse to flush out your nasal passages. You can use plain Mucinex to help thin congestion. If you have a humidifier, running in the bedroom at night. I want you to start OTC vitamin D3 1000 units daily, vitamin C 1000 mg daily, and a zinc supplement. Please take prescribed medications as directed.  You have been enrolled in a MyChart symptom monitoring program. Please answer these questions daily so we can keep track of how you are doing.  You were to quarantine for 5 days from onset of your symptoms.  After day 5, if you have had no fever and you are feeling better, you can end quarantine but need to mask for an additional 5 days. After day 5 if you have a fever or are having significant symptoms, please quarantine for full 10 days.  If you note any worsening of symptoms, any significant shortness of breath or any chest pain, please seek ER evaluation ASAP.  Please do not delay care!  COVID-19: What to Do if You Are Sick If you test positive and are an older adult or someone who is at high risk of getting very sick from COVID-19, treatment may be available. Contact a healthcare provider right away after a positive test to determine if you are eligible, even if your symptoms are mild right now. You can also visit a Test to Treat location and, if eligible, receive a prescription from a provider. Don't delay: Treatment must be started within the first few days to be effective. If you have a fever, cough, or other symptoms, you might have COVID-19. Most people have mild illness and  are able to recover at home. If you are sick: Keep track of your symptoms. If you have an emergency warning sign (including trouble breathing), call 911. Steps to help prevent the spread of COVID-19 if you are sick If you are sick with COVID-19 or think you might have COVID-19, follow the steps below to care for yourself and to help protect other people in your home and community. Stay home except to get medical care Stay home. Most people with COVID-19 have mild illness and can recover at home without medical care. Do not leave your home, except to get medical care. Do not visit public areas and do not go to places where you are unable to wear a mask. Take care of yourself. Get rest and stay hydrated. Take over-the-counter medicines, such as acetaminophen, to help you feel better. Stay in touch with your doctor. Call before you get medical care. Be sure to get care if you have trouble breathing, or have any other emergency warning signs, or if you think it is an emergency. Avoid public transportation, ride-sharing, or taxis if possible. Get tested If you have symptoms of COVID-19, get tested. While waiting for test results, stay away from others, including staying apart from those living in your household. Get tested as soon as possible after your symptoms start. Treatments may be available for people with COVID-19 who are at risk for becoming very sick. Don't delay: Treatment must be started  early to be effective--some treatments must begin within 5 days of your first symptoms. Contact your healthcare provider right away if your test result is positive to determine if you are eligible. Self-tests are one of several options for testing for the virus that causes COVID-19 and may be more convenient than laboratory-based tests and point-of-care tests. Ask your healthcare provider or your local health department if you need help interpreting your test results. You can visit your state, tribal, local, and  territorial health department's website to look for the latest local information on testing sites. Separate yourself from other people As much as possible, stay in a specific room and away from other people and pets in your home. If possible, you should use a separate bathroom. If you need to be around other people or animals in or outside of the home, wear a well-fitting mask. Tell your close contacts that they may have been exposed to COVID-19. An infected person can spread COVID-19 starting 48 hours (or 2 days) before the person has any symptoms or tests positive. By letting your close contacts know they may have been exposed to COVID-19, you are helping to protect everyone. See COVID-19 and Animals if you have questions about pets. If you are diagnosed with COVID-19, someone from the health department may call you. Answer the call to slow the spread. Monitor your symptoms Symptoms of COVID-19 include fever, cough, or other symptoms. Follow care instructions from your healthcare provider and local health department. Your local health authorities may give instructions on checking your symptoms and reporting information. When to seek emergency medical attention Look for emergency warning signs* for COVID-19. If someone is showing any of these signs, seek emergency medical care immediately: Trouble breathing Persistent pain or pressure in the chest New confusion Inability to wake or stay awake Pale, gray, or blue-colored skin, lips, or nail beds, depending on skin tone *This list is not all possible symptoms. Please call your medical provider for any other symptoms that are severe or concerning to you. Call 911 or call ahead to your local emergency facility: Notify the operator that you are seeking care for someone who has or may have COVID-19. Call ahead before visiting your doctor Call ahead. Many medical visits for routine care are being postponed or done by phone or telemedicine. If you have  a medical appointment that cannot be postponed, call your doctor's office, and tell them you have or may have COVID-19. This will help the office protect themselves and other patients. If you are sick, wear a well-fitting mask You should wear a mask if you must be around other people or animals, including pets (even at home). Wear a mask with the best fit, protection, and comfort for you. You don't need to wear the mask if you are alone. If you can't put on a mask (because of trouble breathing, for example), cover your coughs and sneezes in some other way. Try to stay at least 6 feet away from other people. This will help protect the people around you. Masks should not be placed on young children under age 17 years, anyone who has trouble breathing, or anyone who is not able to remove the mask without help. Cover your coughs and sneezes Cover your mouth and nose with a tissue when you cough or sneeze. Throw away used tissues in a lined trash can. Immediately wash your hands with soap and water for at least 20 seconds. If soap and water are not available, clean your  hands with an alcohol-based hand sanitizer that contains at least 60% alcohol. Clean your hands often Wash your hands often with soap and water for at least 20 seconds. This is especially important after blowing your nose, coughing, or sneezing; going to the bathroom; and before eating or preparing food. Use hand sanitizer if soap and water are not available. Use an alcohol-based hand sanitizer with at least 60% alcohol, covering all surfaces of your hands and rubbing them together until they feel dry. Soap and water are the best option, especially if hands are visibly dirty. Avoid touching your eyes, nose, and mouth with unwashed hands. Handwashing Tips Avoid sharing personal household items Do not share dishes, drinking glasses, cups, eating utensils, towels, or bedding with other people in your home. Wash these items thoroughly after  using them with soap and water or put in the dishwasher. Clean surfaces in your home regularly Clean and disinfect high-touch surfaces (for example, doorknobs, tables, handles, light switches, and countertops) in your "sick room" and bathroom. In shared spaces, you should clean and disinfect surfaces and items after each use by the person who is ill. If you are sick and cannot clean, a caregiver or other person should only clean and disinfect the area around you (such as your bedroom and bathroom) on an as needed basis. Your caregiver/other person should wait as long as possible (at least several hours) and wear a mask before entering, cleaning, and disinfecting shared spaces that you use. Clean and disinfect areas that may have blood, stool, or body fluids on them. Use household cleaners and disinfectants. Clean visible dirty surfaces with household cleaners containing soap or detergent. Then, use a household disinfectant. Use a product from [employer] List N: Disinfectants for Coronavirus (EOFHQ-19). Be sure to follow the instructions on the label to ensure safe and effective use of the product. Many products recommend keeping the surface wet with a disinfectant for a certain period of time (look at "contact time" on the product label). You may also need to wear personal protective equipment, such as gloves, depending on the directions on the product label. Immediately after disinfecting, wash your hands with soap and water for 20 seconds. For completed guidance on cleaning and disinfecting your home, visit Complete Disinfection Guidance. Take steps to improve ventilation at home Improve ventilation (air flow) at home to help prevent from spreading COVID-19 to other people in your household. Clear out COVID-19 virus particles in the air by opening windows, using air filters, and turning on fans in your home. Use this interactive tool to learn how to improve air flow in your home. When you can be around  others after being sick with COVID-19 Deciding when you can be around others is different for different situations. Find out when you can safely end home isolation. For any additional questions about your care, contact your healthcare provider or state or local health department. 12/05/2020 Content source: [facility] ([facility]), Division of Viral Diseases This information is not intended to replace advice given to you by your health care provider. Make sure you discuss any questions you have with your health care provider. Document Revised: 01/18/2021 Document Reviewed: 01/18/2021 Elsevier Patient Education  [place].

## 2021-07-17 ENCOUNTER — Other Ambulatory Visit (HOSPITAL_COMMUNITY): Payer: Self-pay

## 2021-07-17 ENCOUNTER — Encounter: Payer: Self-pay | Admitting: Hematology

## 2021-07-17 MED ORDER — CITALOPRAM HYDROBROMIDE 10 MG PO TABS
ORAL_TABLET | Freq: Every day | ORAL | 1 refills | Status: AC
  Filled 2021-07-17 – 2021-09-04 (×3): qty 30, 30d supply, fill #0

## 2021-07-25 ENCOUNTER — Other Ambulatory Visit (HOSPITAL_COMMUNITY): Payer: Self-pay

## 2021-07-26 ENCOUNTER — Other Ambulatory Visit (HOSPITAL_COMMUNITY): Payer: Self-pay

## 2021-07-26 ENCOUNTER — Encounter: Payer: Self-pay | Admitting: Hematology

## 2021-08-27 ENCOUNTER — Other Ambulatory Visit: Payer: Self-pay | Admitting: *Deleted

## 2021-08-27 ENCOUNTER — Telehealth: Payer: Self-pay

## 2021-08-27 DIAGNOSIS — Z1231 Encounter for screening mammogram for malignant neoplasm of breast: Secondary | ICD-10-CM

## 2021-08-27 NOTE — Telephone Encounter (Signed)
Tc to pt lm about the order for her mammo being placed

## 2021-09-04 ENCOUNTER — Encounter: Payer: Self-pay | Admitting: Hematology

## 2021-09-04 ENCOUNTER — Other Ambulatory Visit (HOSPITAL_COMMUNITY): Payer: Self-pay

## 2021-09-12 ENCOUNTER — Encounter: Payer: Self-pay | Admitting: Radiology

## 2021-09-27 ENCOUNTER — Ambulatory Visit: Payer: No Typology Code available for payment source

## 2021-09-28 ENCOUNTER — Encounter: Payer: Self-pay | Admitting: Hematology

## 2021-10-04 ENCOUNTER — Other Ambulatory Visit: Payer: Self-pay

## 2021-10-04 DIAGNOSIS — C8111 Nodular sclerosis classical Hodgkin lymphoma, lymph nodes of head, face, and neck: Secondary | ICD-10-CM

## 2021-10-05 ENCOUNTER — Inpatient Hospital Stay: Payer: PRIVATE HEALTH INSURANCE

## 2021-10-05 ENCOUNTER — Other Ambulatory Visit: Payer: Self-pay

## 2021-10-05 ENCOUNTER — Inpatient Hospital Stay: Payer: PRIVATE HEALTH INSURANCE | Attending: Hematology | Admitting: Hematology

## 2021-10-05 VITALS — BP 115/69 | HR 78 | Temp 97.7°F | Resp 20 | Wt 231.9 lb

## 2021-10-05 DIAGNOSIS — C8111 Nodular sclerosis classical Hodgkin lymphoma, lymph nodes of head, face, and neck: Secondary | ICD-10-CM

## 2021-10-05 DIAGNOSIS — C8190 Hodgkin lymphoma, unspecified, unspecified site: Secondary | ICD-10-CM | POA: Diagnosis present

## 2021-10-05 DIAGNOSIS — Z800 Family history of malignant neoplasm of digestive organs: Secondary | ICD-10-CM | POA: Diagnosis not present

## 2021-10-05 DIAGNOSIS — D509 Iron deficiency anemia, unspecified: Secondary | ICD-10-CM | POA: Insufficient documentation

## 2021-10-05 LAB — CMP (CANCER CENTER ONLY)
ALT: 13 U/L (ref 0–44)
AST: 13 U/L — ABNORMAL LOW (ref 15–41)
Albumin: 4.1 g/dL (ref 3.5–5.0)
Alkaline Phosphatase: 51 U/L (ref 38–126)
Anion gap: 8 (ref 5–15)
BUN: 14 mg/dL (ref 6–20)
CO2: 28 mmol/L (ref 22–32)
Calcium: 9.4 mg/dL (ref 8.9–10.3)
Chloride: 105 mmol/L (ref 98–111)
Creatinine: 0.69 mg/dL (ref 0.44–1.00)
GFR, Estimated: >60 mL/min (ref >60)
Glucose, Bld: 81 mg/dL (ref 70–99)
Potassium: 4.1 mmol/L (ref 3.5–5.1)
Sodium: 141 mmol/L (ref 135–145)
Total Bilirubin: 0.9 mg/dL (ref 0.3–1.2)
Total Protein: 7.5 g/dL (ref 6.5–8.1)

## 2021-10-05 LAB — IRON AND IRON BINDING CAPACITY (CC-WL,HP ONLY)
Iron: 46 ug/dL (ref 28–170)
Saturation Ratios: 14 % (ref 10.4–31.8)
TIBC: 339 ug/dL (ref 250–450)
UIBC: 293 ug/dL (ref 148–442)

## 2021-10-05 LAB — CBC WITH DIFFERENTIAL (CANCER CENTER ONLY)
Abs Immature Granulocytes: 0.01 10*3/uL (ref 0.00–0.07)
Basophils Absolute: 0 10*3/uL (ref 0.0–0.1)
Basophils Relative: 0 %
Eosinophils Absolute: 0.1 10*3/uL (ref 0.0–0.5)
Eosinophils Relative: 2 %
HCT: 38 % (ref 36.0–46.0)
Hemoglobin: 12.7 g/dL (ref 12.0–15.0)
Immature Granulocytes: 0 %
Lymphocytes Relative: 43 %
Lymphs Abs: 3.5 10*3/uL (ref 0.7–4.0)
MCH: 31.8 pg (ref 26.0–34.0)
MCHC: 33.4 g/dL (ref 30.0–36.0)
MCV: 95 fL (ref 80.0–100.0)
Monocytes Absolute: 0.4 10*3/uL (ref 0.1–1.0)
Monocytes Relative: 4 %
Neutro Abs: 4.1 10*3/uL (ref 1.7–7.7)
Neutrophils Relative %: 51 %
Platelet Count: 277 10*3/uL (ref 150–400)
RBC: 4 MIL/uL (ref 3.87–5.11)
RDW: 12.3 % (ref 11.5–15.5)
WBC Count: 8.1 10*3/uL (ref 4.0–10.5)
nRBC: 0 % (ref 0.0–0.2)

## 2021-10-05 LAB — FERRITIN: Ferritin: 27 ng/mL (ref 11–307)

## 2021-10-05 LAB — SEDIMENTATION RATE: Sed Rate: 19 mm/hr (ref 0–22)

## 2021-10-11 ENCOUNTER — Encounter: Payer: Self-pay | Admitting: Hematology

## 2021-10-11 NOTE — Progress Notes (Addendum)
HEMATOLOGY/ONCOLOGY CLINIC NOTE  Date of Service: .10/05/2021  Patient Care Team: [name], MD as PCP - General (Family Medicine)  CHIEF COMPLAINTS/PURPOSE OF CONSULTATION:   Continue management of classical hodgkins lymphoma  CURRENT TREATMENT: Active surveillance   HISTORY OF PRESENTING ILLNESS:   Elizabeth Beasley is a wonderful 41 y.o. female who has been referred to [place] by Dr Madilyn Fireman for evaluation and management of microcytic anemia/cervical lymphadenopathy.The pt reports that she has a history of iron deficiency but has never had a transfusion. Pt has had two pregnancies and has two children, ages 75 and 34. There was a concern for anemia with her second pregnancy so she was given an erythropoietin shot. She has had a copper IUD for about 2 years and her menstrual cycle has been slightly longer since getting the IUD. She has been taking 28 mg TID PO iron since June. Pt has no known medication allergies and has had a previous removal of Bartholin's gland cyst. Pt has had no known contact with chemicals either through work or hobbies. She has no history of asthma or heavy smoke/aerosol exposure.   Pt did not notice any cervical lymphadenopathy in June. In late July, early August she began to experience extreme fatigue and could begin to feel small lumps in her neck. Her fatigue is improved with taking her PO Iron TID. She has experienced a couple of episodes of night sweats, with the latest occurring in late August. Pt has also had <10 lbs loss in 2 months. She has not had any rashes but is experiencing itching in her palms, broader itching after hot showers.   Pt is currently working from home. She has had her seasonal flu shot. Pt is not planning on having more children and is not concerned about fertility. Pt's husband would like to take a drive to the [facility] next month. Pt would not want any blood product transfusions, as she is a Jehovah's Witness. She is  comfortable with IV iron infusions.   INTERVAL HISTORY:  Elizabeth Beasley is a 41 y.o. female is here for her scheduled follow-up for continued evaluation and management of Hodgkin's lymphoma currently under active surveillance. Her last clinic visit with [place] was about 4 months ago. Patient notes no acute new symptoms since her last clinic visit.  No new lumps or bumps.  No fevers no chills no night sweats no unexpected weight loss. Has gained some weight over the holidays-about 6 pounds since August. Labs done today 10/05/2021 reviewed with the patient. No other acute new symptoms.   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   Pt is scheduled for surgery       SURGICAL HISTORY: Past Surgical History:  Procedure Laterality Date   CESAREAN SECTION  2010   INTERCOSTAL NERVE BLOCK  10/06/2020   Procedure: INTERCOSTAL NERVE BLOCK;  Surgeon: [name], MD;  Location: [facility];  Service: Thoracic;;   IR REMOVAL TUN ACCESS W/ PORT W/O FL MOD SED  01/11/2021   LYMPH NODE BIOPSY Left 10/06/2020   Procedure: MEDIASTINAL LYMPH NODE BIOPSY;  Surgeon: [name], MD;  Location: [facility];  Service:  Thoracic;  Laterality: Left;   MASS EXCISION Right 05/21/2019   Procedure: EXCISION OF RIGHT DEEP NECK LYMPH NODE;  Surgeon: Fanny Skates, MD;  Location: [facility];  Service: General;  Laterality: Right;   PORTACATH PLACEMENT Right 06/08/2019   Procedure: INSERTION PORT-A-CATH WITH ULTRASOUND GUIDANCE;  Surgeon: Fanny Skates, MD;  Location: [facility];  Service: General;  Laterality: Right;   URETHRAL CYST REMOVAL N/A 08/30/2016   Procedure: BARTHOLIN'S GLAND CYST EXCISION;  Surgeon:  Ardis Hughs, MD;  Location[facility];  Service: Urology;  Laterality: N/A;   v back     2012    SOCIAL HISTORY: Social History   Socioeconomic History   Marital status: Married    Spouse name: Derrick   Number of children: 2   Years of education: Assoc   Highest education level: Not on file  Occupational History   Occupation: CMA    Employer: [employer]  Tobacco Use   Smoking status: Never   Smokeless tobacco: Never  Vaping Use   Vaping Use: Never used  Substance and Sexual Activity   Alcohol use: Not Currently    Comment: ocassional   Drug use: No   Sexual activity: Yes    Partners: Male    Birth control/protection: I.U.D.  Other Topics Concern   Not on file  Social History Narrative   Regular exercise. No regular caffeine.   Social Determinants of Health   Financial Resource Strain: Not on file  Food Insecurity: Not on file  Transportation Needs: Not on file  Physical Activity: Not on file  Stress: Not on file  Social Connections: Not on file  Intimate Partner Violence: Not on file    FAMILY HISTORY: Family History  Problem Relation Age of Onset   Colon cancer Father 32   Colon polyps Mother    Hypertension Mother    Diabetes Paternal Grandmother    Stroke Paternal Grandmother    Alcohol abuse Paternal Uncle    Colon cancer Paternal Aunt 74   Esophageal cancer Neg Hx    Rectal cancer Neg Hx    Stomach cancer Neg Hx    On PMHx the pt reports C-section, Bartholin's Gland Cyst removal. On Social Hx the pt reports non smoker, no alcohol use. On Family Hx the pt reports father passed from colon cancer, mother has colon polyps.  ALLERGIES:  is allergic to other.  MEDICATIONS:  Current Outpatient Medications  Medication Sig Dispense Refill   b complex vitamins capsule Take 1 capsule by mouth daily.     Biotin 10000 MCG TBDP      cholecalciferol (VITAMIN D3) 25 MCG (1000 UT) tablet Take 2,000 Units by mouth daily.     citalopram  (CELEXA) 10 MG tablet TAKE 1 TABLET BY MOUTH DAILY 30 tablet 1   fluticasone (FLONASE) 50 MCG/ACT nasal spray Place 2 sprays into both nostrils daily as needed for allergies or rhinitis.     loratadine (CLARITIN) 10 MG tablet Take 10 mg by mouth daily.     Polyvinyl Alcohol-Povidone (REFRESH OP) Place 1 drop into both eyes daily as needed (dry eyes).     benzonatate (TESSALON) 100 MG capsule Take 1 capsule (100 mg total) by mouth 2 (two) times daily as needed for cough. 20 capsule 0   levonorgestrel (MIRENA) 20 MCG/24HR IUD 1 each by Intrauterine route once.     promethazine-dextromethorphan (PROMETHAZINE-DM) 6.25-15 MG/5ML syrup Take 2.5 mLs by mouth 4 (four) times daily as needed for cough.  118 mL 0   No current facility-administered medications for this visit.    REVIEW OF SYSTEMS:   .10 Point review of Systems was done is negative except as noted above.  PHYSICAL EXAMINATION: ECOG FS:1 - Symptomatic but completely ambulatory  Vitals:   10/05/21 1030  BP: 115/69  Pulse: 78  Resp: 20  Temp: 97.7 F (36.5 C)  SpO2: 98%   Wt Readings from Last 3 Encounters:  10/05/21 231 lb 14.4 oz (105.2 kg)  06/05/21 223 lb 8 oz (101.4 kg)  04/24/21 225 lb (102.1 kg)   Body mass index is 39.81 kg/m.    .. GENERAL:alert, in no acute distress and comfortable SKIN: no acute rashes, no significant lesions EYES: conjunctiva are pink and non-injected, sclera anicteric OROPHARYNX: MMM, no exudates, no oropharyngeal erythema or ulceration NECK: supple, no JVD LYMPH:  no palpable lymphadenopathy in the cervical, axillary or inguinal regions LUNGS: clear to auscultation b/l with normal respiratory effort HEART: regular rate & rhythm ABDOMEN:  normoactive bowel sounds , non tender, not distended. Extremity: no pedal edema PSYCH: alert & oriented x 3 with fluent speech NEURO: no focal motor/sensory deficits    LABORATORY DATA:  I have reviewed the data as listed . CBC Latest Ref Rng & Units  10/05/2021 06/05/2021 02/02/2021  WBC 4.0 - 10.5 K/uL 8.1 7.9 7.4  Hemoglobin 12.0 - 15.0 g/dL 12.7 13.0 13.9  Hematocrit 36.0 - 46.0 % 38.0 38.6 40.7  Platelets 150 - 400 K/uL 277 287 249    . CMP Latest Ref Rng & Units 10/05/2021 06/05/2021 02/02/2021  Glucose 70 - 99 mg/dL 81 86 82  BUN 6 - 20 mg/dL 14 9 10   Creatinine 0.44 - 1.00 mg/dL 0.69 0.77 0.76  Sodium 135 - 145 mmol/L 141 143 142  Potassium 3.5 - 5.1 mmol/L 4.1 4.1 4.3  Chloride 98 - 111 mmol/L 105 106 104  CO2 22 - 32 mmol/L 28 28 28   Calcium 8.9 - 10.3 mg/dL 9.4 9.3 9.5  Total Protein 6.5 - 8.1 g/dL 7.5 7.4 7.4  Total Bilirubin 0.3 - 1.2 mg/dL 0.9 1.1 0.8  Alkaline Phos 38 - 126 U/L 51 59 61  AST 15 - 41 U/L 13(L) 11(L) 14(L)  ALT 0 - 44 U/L 13 14 13     . Lab Results  Component Value Date   IRON 46 10/05/2021   TIBC 339 10/05/2021   IRONPCTSAT 14 10/05/2021   (Iron and TIBC)  Lab Results  Component Value Date   FERRITIN 27 10/05/2021  08/13/2019 NM PET Image Restag (PS) Skull Base To Thigh (Accession [id number])   05/21/2019 ([id number]) Lymph Node Biopsy    RADIOGRAPHIC STUDIES: I have personally reviewed the radiological images as listed and agreed with the findings in the report. No results found.  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 2) Microcytic Anemia due to anemia of chronic disease and Iron deficiency- hg improved to 13 3) Jehovah's Witness   PLAN: -Patient's labs were discussed with her today.  CBC, CMP, sedimentation rate within normal limits. -Patient has no lab or clinical evidence of Hodgkin's lymphoma recurrence/progression at this time. -Ferritin levels have been gradually coming down and are down to 27 with an iron saturation of 14% likely from menstrual losses.  No other bleeding noted. -We will recommend patient continue her maintenance oral iron polysaccharide 150 mg p.o. daily.   FOLLOW  UP: RTC with  Dr Irene Limbo with labs in 4 months   All of the patient's questions were answered with apparent satisfaction. The patient knows to call the clinic with any problems, questions or concerns.   Sullivan Lone MD [place] AAHIVMS [facility] Hematology/Oncology Physician [facility]

## 2021-10-16 MED ORDER — POLYSACCHARIDE IRON COMPLEX 150 MG PO CAPS: 150.0000 mg | ORAL_CAPSULE | Freq: Every day | ORAL | 5 refills | Status: AC

## 2021-10-16 NOTE — Addendum Note (Signed)
Addended by: Sullivan Lone on: 10/16/2021 01:32 AM   Modules accepted: Orders

## 2021-10-19 ENCOUNTER — Telehealth: Payer: Self-pay

## 2021-10-19 NOTE — Telephone Encounter (Signed)
Per Dr Irene Limbo: let patient know her iron levels are gradually coming down and that she should restart her p.o. iron polysaccharide 150 mg daily over-the-counter avoid iron deficiency anemia from her menstrual losses.  Pt acknowledged information and verbalized understanding.

## 2022-01-17 IMAGING — PT NM PET TUM IMG RESTAG (PS) SKULL BASE T - THIGH
1 of 7 series · 1 of 25 positions shown · non-contrast
Comparison: 08/25/2020

CLINICAL DATA: Subsequent treatment strategy for Hodgkin's
lymphoma. Status post chemotherapy last year. Evaluate treatment
response.

EXAM:
NUCLEAR MEDICINE PET SKULL BASE TO THIGH
TECHNIQUE: 11.1 mCi F-18 FDG was injected intravenously. Full-ring PET imaging
was performed from the skull base to thigh after the radiotracer. CT
data was obtained and used for attenuation correction and anatomic
localization.
Fasting blood glucose: 92 mg/dl

[Series 3: pet sk_thigh ac · axial · 5.0mm · 4.07mm/px · 1 of 228 slices shown]
[im 137/228]
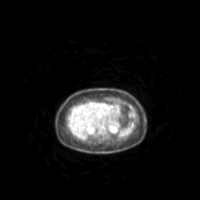

[1 of 25 positions shown; findings below may reference images not displayed]

FINDINGS: Mediastinal blood pool activity: SUV max

Liver activity: SUV max

NECK: No cervical nodal hypermetabolism. Symmetric palatine tonsil
hypermetabolism is favored to be physiologic, including at a S.U.V.
max of 10.1.

Incidental CT findings: 1.0 cm low right jugular node on 39/4 is
similar to 11 mm on the prior. No enlarging cervical nodes.

CHEST: Conglomerate nodes in the anterior mediastinum measures on
the order of 4.3 x 2.1 cm and a S.U.V. max of 4.1 on 56/4.
(Previously 5.7 x 2.6 cm and a S.U.V. max of 5.1.)

Right paratracheal node measures 1.4 cm and a S.U.V. max of 3.0 on
56/4 (Previously 1.6 cm and a S.U.V. max of 3.3.)

Incidental CT findings: No axillary adenopathy. Left lower lobe
scarring.

ABDOMEN/PELVIS: No abdominopelvic nodal hypermetabolism. Foci of
hypermetabolism within the uterus at up to a S.U.V. max of 6.0 again
suggests underlying fibroids.

Left ovarian hypermetabolism at a S.U.V. max of 7.2 on 152/4. No
well-defined dominant mass and likely physiologic.

Incidental CT findings: Normal adrenal glands. No abdominopelvic
adenopathy. Large colonic stool burden. Segment 4 a hepatic cyst.

SKELETON: No abnormal marrow activity.

Incidental CT findings: none
IMPRESSION: 1. Minimal decrease in size of anterior and middle mediastinal nodes
with persistent mild hypermetabolism. ([HOSPITAL]) 4
2. Palatine tonsil hypermetabolism is favored to be physiologic.
Recommend attention on follow-up.
3. Otherwise, no new or progressive disease.

## 2022-01-23 ENCOUNTER — Other Ambulatory Visit: Payer: Self-pay

## 2022-01-23 DIAGNOSIS — C8111 Nodular sclerosis classical Hodgkin lymphoma, lymph nodes of head, face, and neck: Secondary | ICD-10-CM

## 2022-02-01 ENCOUNTER — Inpatient Hospital Stay: Payer: PRIVATE HEALTH INSURANCE

## 2022-02-01 ENCOUNTER — Inpatient Hospital Stay: Payer: PRIVATE HEALTH INSURANCE | Attending: Hematology | Admitting: Hematology

## 2022-02-01 ENCOUNTER — Other Ambulatory Visit: Payer: Self-pay

## 2022-02-01 VITALS — BP 121/74 | HR 94 | Temp 97.5°F | Resp 20 | Wt 232.7 lb

## 2022-02-01 DIAGNOSIS — C8111 Nodular sclerosis classical Hodgkin lymphoma, lymph nodes of head, face, and neck: Secondary | ICD-10-CM

## 2022-02-01 DIAGNOSIS — E611 Iron deficiency: Secondary | ICD-10-CM | POA: Insufficient documentation

## 2022-02-01 DIAGNOSIS — Z8572 Personal history of non-Hodgkin lymphomas: Secondary | ICD-10-CM | POA: Insufficient documentation

## 2022-02-01 LAB — CBC WITH DIFFERENTIAL (CANCER CENTER ONLY)
Abs Immature Granulocytes: 0.02 10*3/uL (ref 0.00–0.07)
Basophils Absolute: 0 10*3/uL (ref 0.0–0.1)
Basophils Relative: 0 %
Eosinophils Absolute: 0.1 10*3/uL (ref 0.0–0.5)
Eosinophils Relative: 1 %
HCT: 40 % (ref 36.0–46.0)
Hemoglobin: 13.8 g/dL (ref 12.0–15.0)
Immature Granulocytes: 0 %
Lymphocytes Relative: 42 %
Lymphs Abs: 3.6 10*3/uL (ref 0.7–4.0)
MCH: 32.5 pg (ref 26.0–34.0)
MCHC: 34.5 g/dL (ref 30.0–36.0)
MCV: 94.1 fL (ref 80.0–100.0)
Monocytes Absolute: 0.4 10*3/uL (ref 0.1–1.0)
Monocytes Relative: 4 %
Neutro Abs: 4.5 10*3/uL (ref 1.7–7.7)
Neutrophils Relative %: 53 %
Platelet Count: 287 10*3/uL (ref 150–400)
RBC: 4.25 MIL/uL (ref 3.87–5.11)
RDW: 12.3 % (ref 11.5–15.5)
WBC Count: 8.6 10*3/uL (ref 4.0–10.5)
nRBC: 0 % (ref 0.0–0.2)

## 2022-02-01 LAB — CMP (CANCER CENTER ONLY)
ALT: 13 U/L (ref 0–44)
AST: 14 U/L — ABNORMAL LOW (ref 15–41)
Albumin: 4.2 g/dL (ref 3.5–5.0)
Alkaline Phosphatase: 51 U/L (ref 38–126)
Anion gap: 8 (ref 5–15)
BUN: 12 mg/dL (ref 6–20)
CO2: 28 mmol/L (ref 22–32)
Calcium: 9.4 mg/dL (ref 8.9–10.3)
Chloride: 105 mmol/L (ref 98–111)
Creatinine: 0.78 mg/dL (ref 0.44–1.00)
GFR, Estimated: >60 mL/min (ref >60)
Glucose, Bld: 94 mg/dL (ref 70–99)
Potassium: 3.7 mmol/L (ref 3.5–5.1)
Sodium: 141 mmol/L (ref 135–145)
Total Bilirubin: 1.2 mg/dL (ref 0.3–1.2)
Total Protein: 7.9 g/dL (ref 6.5–8.1)

## 2022-02-01 LAB — IRON AND IRON BINDING CAPACITY (CC-WL,HP ONLY)
Iron: 126 ug/dL (ref 28–170)
Saturation Ratios: 38 % — ABNORMAL HIGH (ref 10.4–31.8)
TIBC: 330 ug/dL (ref 250–450)
UIBC: 204 ug/dL (ref 148–442)

## 2022-02-01 LAB — SEDIMENTATION RATE: Sed Rate: 26 mm/hr — ABNORMAL HIGH (ref 0–22)

## 2022-02-01 LAB — FERRITIN: Ferritin: 22 ng/mL (ref 11–307)

## 2022-02-04 ENCOUNTER — Telehealth: Payer: Self-pay | Admitting: Hematology

## 2022-02-04 NOTE — Telephone Encounter (Signed)
Left message with follow-up appointment per 5/19 los.

## 2022-02-07 ENCOUNTER — Encounter: Payer: Self-pay | Admitting: Hematology

## 2022-02-07 NOTE — Progress Notes (Signed)
HEMATOLOGY/ONCOLOGY CLINIC NOTE  Date of Service: .02/01/2022  Patient Care Team: [name], MD as PCP - General (Family Medicine)  CHIEF COMPLAINTS/PURPOSE OF CONSULTATION:   Continued evaluation and management of Hodgkin's lymphoma  CURRENT TREATMENT: Active surveillance   HISTORY OF PRESENTING ILLNESS:   Elizabeth Beasley is a wonderful 41 y.o. female who has been referred to [place] by Dr Madilyn Fireman for evaluation and management of microcytic anemia/cervical lymphadenopathy.The pt reports that she has a history of iron deficiency but has never had a transfusion. Pt has had two pregnancies and has two children, ages 23 and 67. There was a concern for anemia with her second pregnancy so she was given an erythropoietin shot. She has had a copper IUD for about 2 years and her menstrual cycle has been slightly longer since getting the IUD. She has been taking 28 mg TID PO iron since June. Pt has no known medication allergies and has had a previous removal of Bartholin's gland cyst. Pt has had no known contact with chemicals either through work or hobbies. She has no history of asthma or heavy smoke/aerosol exposure.   Pt did not notice any cervical lymphadenopathy in June. In late July, early August she began to experience extreme fatigue and could begin to feel small lumps in her neck. Her fatigue is improved with taking her PO Iron TID. She has experienced a couple of episodes of night sweats, with the latest occurring in late August. Pt has also had <10 lbs loss in 2 months. She has not had any rashes but is experiencing itching in her palms, broader itching after hot showers.   Pt is currently working from home. She has had her seasonal flu shot. Pt is not planning on having more children and is not concerned about fertility. Pt's husband would like to take a drive to the [facility] next month. Pt would not want any blood product transfusions, as she is a Jehovah's Witness.  She is comfortable with IV iron infusions.   INTERVAL HISTORY:  Elizabeth Beasley is a 41 y.o. female is here for her scheduled follow-up for continued evaluation and management of Hodgkin's lymphoma.  She notes that she has been feeling well and notes no acute new symptoms. No fevers no chills no night sweats no unexpected weight loss.  No new lumps or bumps.  No new skin rashes or pruritus.  No chest pain or shortness of breath. No residual toxicities from previous chemotherapy. Labs done today were discussed in detail with the patient.  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   Pt is scheduled for surgery       SURGICAL HISTORY: Past Surgical History:  Procedure Laterality Date   CESAREAN SECTION  2010   INTERCOSTAL NERVE BLOCK  10/06/2020   Procedure: INTERCOSTAL NERVE BLOCK;  Surgeon: [name], MD;  Location: [place];  Service: Thoracic;;   IR REMOVAL TUN ACCESS W/ PORT W/O FL MOD SED  01/11/2021   LYMPH NODE BIOPSY Left 10/06/2020   Procedure: MEDIASTINAL LYMPH NODE BIOPSY;  Surgeon: [name], MD;  Location: [place];  Service: Thoracic;  Laterality: Left;  MASS EXCISION Right 05/21/2019   Procedure: EXCISION OF RIGHT DEEP NECK LYMPH NODE;  Surgeon: Fanny Skates, MD;  Location: [place];  Service: General;  Laterality: Right;   PORTACATH PLACEMENT Right 06/08/2019   Procedure: INSERTION PORT-A-CATH WITH ULTRASOUND GUIDANCE;  Surgeon: Fanny Skates, MD;  Location: [place];  Service: General;  Laterality: Right;   URETHRAL CYST REMOVAL N/A 08/30/2016   Procedure: BARTHOLIN'S GLAND CYST EXCISION;  Surgeon: Ardis Hughs, MD;  [facility];  Service: Urology;  Laterality: N/A;   v back     2012    SOCIAL HISTORY: Social History   Socioeconomic History   Marital status: Married    Spouse name: Derrick   Number of children: 2   Years of education: Assoc   Highest education level: Not on file  Occupational History   Occupation: CMA    Employer: [employer]  Tobacco Use   Smoking status: Never   Smokeless tobacco: Never  Vaping Use   Vaping Use: Never used  Substance and Sexual Activity   Alcohol use: Not Currently    Comment: ocassional   Drug use: No   Sexual activity: Yes    Partners: Male    Birth control/protection: I.U.D.  Other Topics Concern   Not on file  Social History Narrative   Regular exercise. No regular caffeine.   Social Determinants of Health   Financial Resource Strain: Not on file  Food Insecurity: Not on file  Transportation Needs: Not on file  Physical Activity: Not on file  Stress: Not on file  Social Connections: Not on file  Intimate Partner Violence: Not on file    FAMILY HISTORY: Family History  Problem Relation Age of Onset   Colon cancer Father 43   Colon polyps Mother    Hypertension Mother    Diabetes Paternal Grandmother    Stroke Paternal Grandmother    Alcohol abuse Paternal Uncle    Colon cancer Paternal Aunt 32   Esophageal cancer Neg Hx    Rectal cancer Neg Hx    Stomach cancer Neg Hx    On PMHx the pt reports C-section, Bartholin's Gland Cyst removal. On Social Hx the pt reports non smoker, no alcohol use. On Family Hx the pt reports father passed from colon cancer, mother has colon polyps.  ALLERGIES:  is allergic to other.  MEDICATIONS:  Current Outpatient Medications  Medication Sig Dispense Refill   b complex vitamins capsule Take 1 capsule by mouth daily.     cholecalciferol (VITAMIN D3) 25 MCG (1000 UT) tablet Take 2,000 Units by mouth daily.     citalopram (CELEXA) 10 MG tablet TAKE 1 TABLET BY MOUTH DAILY 30 tablet 1    fluticasone (FLONASE) 50 MCG/ACT nasal spray Place 2 sprays into both nostrils daily as needed for allergies or rhinitis.     iron polysaccharides (NIFEREX) 150 MG capsule Take 1 capsule (150 mg total) by mouth daily. 30 capsule 5   loratadine (CLARITIN) 10 MG tablet Take 10 mg by mouth daily.     Polyvinyl Alcohol-Povidone (REFRESH OP) Place 1 drop into both eyes daily as needed (dry eyes).     No current facility-administered medications for this visit.    REVIEW OF SYSTEMS:   10 Point review of Systems was done is negative except as noted above.  PHYSICAL EXAMINATION: ECOG FS:1 - Symptomatic but completely ambulatory  Vitals:   02/01/22 1013  BP: 121/74  Pulse: 94  Resp: 20  Temp: (!) 97.5 F (36.4 C)  SpO2: 100%   Wt Readings from Last 3 Encounters:  02/01/22 232 lb 11.2 oz (105.6 kg)  10/05/21 231 lb 14.4 oz (105.2 kg)  06/05/21 223 lb 8 oz (101.4 kg)   Body mass index is 39.94 kg/m.    GENERAL:alert, in no acute distress and comfortable SKIN: no acute rashes, no significant lesions EYES: conjunctiva are pink and non-injected, sclera anicteric OROPHARYNX: MMM, no exudates, no oropharyngeal erythema or ulceration NECK: supple, no JVD LYMPH:  no palpable lymphadenopathy in the cervical, axillary or inguinal regions LUNGS: clear to auscultation b/l with normal respiratory effort HEART: regular rate & rhythm ABDOMEN:  normoactive bowel sounds , non tender, not distended. Extremity: no pedal edema PSYCH: alert & oriented x 3 with fluent speech NEURO: no focal motor/sensory deficits   LABORATORY DATA:  I have reviewed the data as listed .    Latest Ref Rng & Units 02/01/2022    9:58 AM 10/05/2021   10:21 AM 06/05/2021    9:58 AM  CBC  WBC 4.0 - 10.5 K/uL 8.6   8.1   7.9    Hemoglobin 12.0 - 15.0 g/dL 13.8   12.7   13.0    Hematocrit 36.0 - 46.0 % 40.0   38.0   38.6    Platelets 150 - 400 K/uL 287   277   287      .    Latest Ref Rng & Units 02/01/2022    9:58  AM 10/05/2021   10:21 AM 06/05/2021    9:58 AM  CMP  Glucose 70 - 99 mg/dL 94   81   86    BUN 6 - 20 mg/dL '12   14   9    '$ Creatinine 0.44 - 1.00 mg/dL 0.78   0.69   0.77    Sodium 135 - 145 mmol/L 141   141   143    Potassium 3.5 - 5.1 mmol/L 3.7   4.1   4.1    Chloride 98 - 111 mmol/L 105   105   106    CO2 22 - 32 mmol/L '28   28   28    '$ Calcium 8.9 - 10.3 mg/dL 9.4   9.4   9.3    Total Protein 6.5 - 8.1 g/dL 7.9   7.5   7.4    Total Bilirubin 0.3 - 1.2 mg/dL 1.2   0.9   1.1    Alkaline Phos 38 - 126 U/L 51   51   59    AST 15 - 41 U/L '14   13   11    '$ ALT 0 - 44 U/L '13   13   14     '$ Sed rate 26 . Lab Results  Component Value Date   IRON 126 02/01/2022   TIBC 330 02/01/2022   IRONPCTSAT 38 (H) 02/01/2022   (Iron and TIBC)  Lab Results  Component Value Date   FERRITIN 22 02/01/2022    08/13/2019 NM PET Image Restag (PS) Skull Base To Thigh (Accession [id number])   05/21/2019 ([id number]) Lymph Node Biopsy    RADIOGRAPHIC STUDIES: I have personally reviewed the radiological images as listed and agreed with the findings in the report. No results found.  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 2) Microcytic Anemia due to anemia of chronic disease and Iron deficiency- hg improved  to 13 3) Jehovah's Witness   PLAN: -Patient has no clinical symptoms suggestive of Hodgkin's lymphoma progression at this time -Labs done today were discussed in detail CBC, CMP within normal limits Sed rate borderline elevated at 26 is quite nonspecific and we will monitor this on follow-up. -We will recommend patient continue her maintenance oral iron polysaccharide 150 mg p.o. daily. -Mild iron deficiency without anemia likely from menstrual losses.  Continue oral iron polysaccharide 150 mg p.o. daily. -No other significant residual toxicities related to her previous chemotherapy at this  time. -Patient is now 2 years out from completing her chemotherapy and we will switch to 74-monthfollow-ups FOLLOW UP: RTC with Dr KIrene Limbowith labs in 6 months  .The total time spent in the appointment was 22 minutes* .  All of the patient's questions were answered with apparent satisfaction. The patient knows to call the clinic with any problems, questions or concerns.   GSullivan LoneMD MS AAHIVMS SMer[facility] ServicesCCoral [facility] Surgery Center LLCHematology/Oncology Physician [facility]La[facility] .*Total Encounter Time as defined by the Centers for Medicare and Medicaid Services includes, in addition to the face-to-face time of a patient visit (documented in the note above) non-face-to-face time: obtaining and reviewing outside history, ordering and reviewing medications, tests or procedures, care coordination (communications with other health care professionals or caregivers) and documentation in the medical record.

## 2022-02-20 ENCOUNTER — Other Ambulatory Visit: Payer: Self-pay | Admitting: Oncology

## 2022-08-12 ENCOUNTER — Inpatient Hospital Stay: Payer: PRIVATE HEALTH INSURANCE | Attending: Hematology | Admitting: Hematology

## 2022-08-12 ENCOUNTER — Other Ambulatory Visit: Payer: Self-pay

## 2022-08-12 ENCOUNTER — Inpatient Hospital Stay: Payer: PRIVATE HEALTH INSURANCE

## 2022-08-12 VITALS — BP 121/73 | HR 83 | Temp 98.1°F | Resp 17 | Ht 64.0 in | Wt 231.9 lb

## 2022-08-12 DIAGNOSIS — D509 Iron deficiency anemia, unspecified: Secondary | ICD-10-CM | POA: Diagnosis not present

## 2022-08-12 DIAGNOSIS — Z800 Family history of malignant neoplasm of digestive organs: Secondary | ICD-10-CM | POA: Diagnosis not present

## 2022-08-12 DIAGNOSIS — C8111 Nodular sclerosis classical Hodgkin lymphoma, lymph nodes of head, face, and neck: Secondary | ICD-10-CM

## 2022-08-12 DIAGNOSIS — C8190 Hodgkin lymphoma, unspecified, unspecified site: Secondary | ICD-10-CM | POA: Diagnosis not present

## 2022-08-12 DIAGNOSIS — C8110 Nodular sclerosis classical Hodgkin lymphoma, unspecified site: Secondary | ICD-10-CM

## 2022-08-12 LAB — CMP (CANCER CENTER ONLY)
ALT: 14 U/L (ref 0–44)
AST: 12 U/L — ABNORMAL LOW (ref 15–41)
Albumin: 4.3 g/dL (ref 3.5–5.0)
Alkaline Phosphatase: 62 U/L (ref 38–126)
Anion gap: 7 (ref 5–15)
BUN: 14 mg/dL (ref 6–20)
CO2: 28 mmol/L (ref 22–32)
Calcium: 9.6 mg/dL (ref 8.9–10.3)
Chloride: 104 mmol/L (ref 98–111)
Creatinine: 0.71 mg/dL (ref 0.44–1.00)
GFR, Estimated: >60 mL/min (ref >60)
Glucose, Bld: 91 mg/dL (ref 70–99)
Potassium: 3.9 mmol/L (ref 3.5–5.1)
Sodium: 139 mmol/L (ref 135–145)
Total Bilirubin: 0.8 mg/dL (ref 0.3–1.2)
Total Protein: 7.6 g/dL (ref 6.5–8.1)

## 2022-08-12 LAB — CBC WITH DIFFERENTIAL (CANCER CENTER ONLY)
Abs Immature Granulocytes: 0.02 10*3/uL (ref 0.00–0.07)
Basophils Absolute: 0 10*3/uL (ref 0.0–0.1)
Basophils Relative: 0 %
Eosinophils Absolute: 0.2 10*3/uL (ref 0.0–0.5)
Eosinophils Relative: 2 %
HCT: 38.8 % (ref 36.0–46.0)
Hemoglobin: 13.1 g/dL (ref 12.0–15.0)
Immature Granulocytes: 0 %
Lymphocytes Relative: 42 %
Lymphs Abs: 3.8 10*3/uL (ref 0.7–4.0)
MCH: 32.1 pg (ref 26.0–34.0)
MCHC: 33.8 g/dL (ref 30.0–36.0)
MCV: 95.1 fL (ref 80.0–100.0)
Monocytes Absolute: 0.5 10*3/uL (ref 0.1–1.0)
Monocytes Relative: 5 %
Neutro Abs: 4.7 10*3/uL (ref 1.7–7.7)
Neutrophils Relative %: 51 %
Platelet Count: 296 10*3/uL (ref 150–400)
RBC: 4.08 MIL/uL (ref 3.87–5.11)
RDW: 13.1 % (ref 11.5–15.5)
WBC Count: 9.2 10*3/uL (ref 4.0–10.5)
nRBC: 0 % (ref 0.0–0.2)

## 2022-08-12 LAB — IRON AND IRON BINDING CAPACITY (CC-WL,HP ONLY)
Iron: 60 ug/dL (ref 28–170)
Saturation Ratios: 17 % (ref 10.4–31.8)
TIBC: 350 ug/dL (ref 250–450)
UIBC: 290 ug/dL (ref 148–442)

## 2022-08-12 LAB — SEDIMENTATION RATE: Sed Rate: 22 mm/hr (ref 0–22)

## 2022-08-12 LAB — FERRITIN: Ferritin: 16 ng/mL (ref 11–307)

## 2022-08-12 NOTE — Progress Notes (Signed)
HEMATOLOGY/ONCOLOGY CLINIC NOTE  Date of Service: 08/12/22  Patient Care Team: [name], MD as PCP - General (Family Medicine)  CHIEF COMPLAINTS/PURPOSE OF CONSULTATION:   Continued evaluation and management of Hodgkin's lymphoma  CURRENT TREATMENT: Active surveillance of classical hodgkins lymphoma   HISTORY OF PRESENTING ILLNESS:   Elizabeth Beasley is a wonderful 41 y.o. female who has been referred to [place] by Dr Madilyn Fireman for evaluation and management of microcytic anemia/cervical lymphadenopathy.The pt reports that she has a history of iron deficiency but has never had a transfusion. Pt has had two pregnancies and has two children, ages 22 and 70. There was a concern for anemia with her second pregnancy so she was given an erythropoietin shot. She has had a copper IUD for about 2 years and her menstrual cycle has been slightly longer since getting the IUD. She has been taking 28 mg TID PO iron since June. Pt has no known medication allergies and has had a previous removal of Bartholin's gland cyst. Pt has had no known contact with chemicals either through work or hobbies. She has no history of asthma or heavy smoke/aerosol exposure.   Pt did not notice any cervical lymphadenopathy in June. In late July, early August she began to experience extreme fatigue and could begin to feel small lumps in her neck. Her fatigue is improved with taking her PO Iron TID. She has experienced a couple of episodes of night sweats, with the latest occurring in late August. Pt has also had <10 lbs loss in 2 months. She has not had any rashes but is experiencing itching in her palms, broader itching after hot showers.   Pt is currently working from home. She has had her seasonal flu shot. Pt is not planning on having more children and is not concerned about fertility. Pt's husband would like to take a drive to the [facility] next month. Pt would not want any blood product transfusions, as  she is a Jehovah's Witness. She is comfortable with IV iron infusions.   INTERVAL HISTORY:  Elizabeth Beasley is a 41 y.o. female is here for her scheduled follow-up for continued evaluation and management of Hodgkin's lymphoma.   Patient was last seen by me on 02/01/2022 and was doing well overall without any medical concerns.   Patient notes she is doing well without any new medical concerns since pur last visit. She denies shortness of breath, fever, chills, night sweats, back pain, abdominal pain, loss of appetite, or leg swelling. She notes her menstrual periods are normal.   She is not taking iron supplements.   She has received influenza vaccine, but not COVID-19 Booster.   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;  Service: Thoracic;  Laterality: Left;   MASS EXCISION Right 05/21/2019   Procedure: EXCISION OF RIGHT DEEP NECK LYMPH NODE;  Surgeon: Fanny Skates, MD;  Location: [place];  Service: General;  Laterality: Right;   PORTACATH PLACEMENT Right 06/08/2019   Procedure: INSERTION PORT-A-CATH WITH ULTRASOUND GUIDANCE;  Surgeon: Fanny Skates, MD;  Location: [place];  Service: General;  Laterality:  Right;   URETHRAL CYST REMOVAL N/A 08/30/2016   Procedure: BARTHOLIN'S GLAND CYST EXCISION;  Surgeon: Ardis Hughs, MD;  Location[facility];  Service: Urology;  Laterality: N/A;   v back     2012    SOCIAL HISTORY: Social History   Socioeconomic History   Marital status: Married    Spouse name: Derrick   Number of children: 2   Years of education: Assoc   Highest education level: Not on file  Occupational History   Occupation: CMA    Employer: [employer]  Tobacco Use   Smoking status: Never   Smokeless tobacco: Never  Vaping Use   Vaping Use: Never used  Substance and Sexual Activity   Alcohol use: Not Currently    Comment: ocassional   Drug use: No   Sexual activity: Yes    Partners: Male    Birth control/protection: I.U.D.  Other Topics Concern   Not on file  Social History Narrative   Regular exercise. No regular caffeine.   Social Determinants of Health   Financial Resource Strain: Not on file  Food Insecurity: Not on file  Transportation Needs: Not on file  Physical Activity: Not on file  Stress: Not on file  Social Connections: Not on file  Intimate Partner Violence: Not on file    FAMILY HISTORY: Family History  Problem Relation Age of Onset   Colon cancer Father 41   Colon polyps Mother    Hypertension Mother    Diabetes Paternal Grandmother    Stroke Paternal Grandmother    Alcohol abuse Paternal Uncle    Colon cancer Paternal Aunt 60   Esophageal cancer Neg Hx    Rectal cancer Neg Hx    Stomach cancer Neg Hx    On PMHx the pt reports C-section, Bartholin's Gland Cyst removal. On Social Hx the pt reports non smoker, no alcohol use. On Family Hx the pt reports father passed from colon cancer, mother has colon polyps.  ALLERGIES:  is allergic to other.  MEDICATIONS:  Current Outpatient Medications  Medication Sig Dispense Refill   b complex vitamins capsule Take 1 capsule by mouth daily.     cholecalciferol (VITAMIN  D3) 25 MCG (1000 UT) tablet Take 2,000 Units by mouth daily.     citalopram (CELEXA) 10 MG tablet TAKE 1 TABLET BY MOUTH DAILY 30 tablet 1   fluticasone (FLONASE) 50 MCG/ACT nasal spray Place 2 sprays into both nostrils daily as needed for allergies or rhinitis.     iron polysaccharides (NIFEREX) 150 MG capsule Take 1 capsule (150 mg total) by mouth daily. 30 capsule 5   loratadine (CLARITIN) 10 MG tablet Take 10 mg by mouth daily.     Polyvinyl Alcohol-Povidone (REFRESH OP) Place 1 drop into both eyes daily as needed (dry eyes).     No current facility-administered medications for this visit.    REVIEW OF SYSTEMS:   10 Point review of Systems was done is negative except as noted above.  PHYSICAL EXAMINATION:  ECOG FS:1 - Symptomatic but completely ambulatory  Vitals:   08/12/22 1012  BP: 121/73  Pulse: 83  Resp: 17  Temp: 98.1 F (36.7 C)  SpO2: 97%    Wt Readings from Last 3 Encounters:  02/01/22 232 lb 11.2 oz (105.6 kg)  10/05/21 231 lb 14.4 oz (105.2 kg)  06/05/21 223 lb 8 oz (101.4 kg)   Body mass index is 39.81 kg/m.    GENERAL:alert, in no acute distress and comfortable SKIN: no acute rashes, no significant lesions EYES: conjunctiva are pink and non-injected, sclera anicteric OROPHARYNX: MMM, no exudates, no oropharyngeal erythema or ulceration NECK: supple, no JVD LYMPH:  no palpable lymphadenopathy in the cervical, axillary or inguinal regions LUNGS: clear to auscultation b/l with normal respiratory effort HEART: regular rate & rhythm ABDOMEN:  normoactive bowel sounds , non tender, not distended.no palpable hepatosplenomegaly Extremity: no pedal edema PSYCH: alert & oriented x 3 with fluent speech NEURO: no focal motor/sensory deficits   LABORATORY DATA:  I have reviewed the data as listed .    Latest Ref Rng & Units 08/12/2022   10:05 AM 02/01/2022    9:58 AM 10/05/2021   10:21 AM  CBC  WBC 4.0 - 10.5 K/uL 9.2  8.6  8.1   Hemoglobin 12.0 - 15.0 g/dL  13.1  13.8  12.7   Hematocrit 36.0 - 46.0 % 38.8  40.0  38.0   Platelets 150 - 400 K/uL 296  287  277     .    Latest Ref Rng & Units 08/12/2022   10:05 AM 02/01/2022    9:58 AM 10/05/2021   10:21 AM  CMP  Glucose 70 - 99 mg/dL 91  94  81   BUN 6 - 20 mg/dL '14  12  14   '$ Creatinine 0.44 - 1.00 mg/dL 0.71  0.78  0.69   Sodium 135 - 145 mmol/L 139  141  141   Potassium 3.5 - 5.1 mmol/L 3.9  3.7  4.1   Chloride 98 - 111 mmol/L 104  105  105   CO2 22 - 32 mmol/L '28  28  28   '$ Calcium 8.9 - 10.3 mg/dL 9.6  9.4  9.4   Total Protein 6.5 - 8.1 g/dL 7.6  7.9  7.5   Total Bilirubin 0.3 - 1.2 mg/dL 0.8  1.2  0.9   Alkaline Phos 38 - 126 U/L 62  51  51   AST 15 - 41 U/L '12  14  13   '$ ALT 0 - 44 U/L '14  13  13    '$ Sed rate 26 . Lab Results  Component Value Date   IRON 60 08/12/2022   TIBC 350 08/12/2022   IRONPCTSAT 17 08/12/2022   (Iron and TIBC)  Lab Results  Component Value Date   FERRITIN 16 08/12/2022    08/13/2019 NM PET Image Restag (PS) Skull Base To Thigh (Accession [id number])   05/21/2019 ([id number]) Lymph Node Biopsy    RADIOGRAPHIC STUDIES: I have personally reviewed the radiological images as listed and agreed with the findings in the report. No results found.  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 2) Microcytic Anemia due to anemia of chronic disease and Iron deficiency- hg improved to 13 3) Jehovah's Witness   PLAN: -Patient has no clinical symptoms suggestive of Hodgkin's lymphoma progression at this time -Labs done today were discussed in detail CBC,  CMP within normal limits Sed rate wnl at 22  -Mild iron deficiency without anemia likely from menstrual losses.  Continue oral iron polysaccharide 150 mg p.o. daily. -No other significant residual toxicities related to her previous chemotherapy at this time. -rtc with Dr Irene Limbo with labs in 6 6 months  FOLLOW  UP: RTC with Dr Irene Limbo with labs in 6 months  The total time spent in the appointment was 20 minutes* .  All of the patient's questions were answered with apparent satisfaction. The patient knows to call the clinic with any problems, questions or concerns.   Sullivan Lone MD MS AAHIVMS [facility] Hematology/Oncology Physician [facility]  .*Total Encounter Time as defined by the Centers for Medicare and Medicaid Services includes, in addition to the face-to-face time of a patient visit (documented in the note above) non-face-to-face time: obtaining and reviewing outside history, ordering and reviewing medications, tests or procedures, care coordination (communications with other health care professionals or caregivers) and documentation in the medical record.   I, Cleda Mccreedy, am acting as a Education administrator for Sullivan Lone, MD. .I have reviewed the above documentation for accuracy and completeness, and I agree with the above. Brunetta Genera MD

## 2022-10-04 ENCOUNTER — Other Ambulatory Visit (HOSPITAL_COMMUNITY): Payer: Self-pay

## 2023-02-06 ENCOUNTER — Other Ambulatory Visit: Payer: Self-pay

## 2023-02-06 DIAGNOSIS — C8110 Nodular sclerosis classical Hodgkin lymphoma, unspecified site: Secondary | ICD-10-CM

## 2023-02-11 ENCOUNTER — Inpatient Hospital Stay: Payer: PRIVATE HEALTH INSURANCE | Attending: Hematology

## 2023-02-11 ENCOUNTER — Inpatient Hospital Stay (HOSPITAL_BASED_OUTPATIENT_CLINIC_OR_DEPARTMENT_OTHER): Payer: PRIVATE HEALTH INSURANCE | Admitting: Hematology

## 2023-02-11 VITALS — BP 127/72 | HR 78 | Temp 98.1°F | Resp 17 | Wt 237.3 lb

## 2023-02-11 DIAGNOSIS — Z800 Family history of malignant neoplasm of digestive organs: Secondary | ICD-10-CM | POA: Insufficient documentation

## 2023-02-11 DIAGNOSIS — C8110 Nodular sclerosis classical Hodgkin lymphoma, unspecified site: Secondary | ICD-10-CM

## 2023-02-11 DIAGNOSIS — D509 Iron deficiency anemia, unspecified: Secondary | ICD-10-CM | POA: Diagnosis not present

## 2023-02-11 LAB — CBC WITH DIFFERENTIAL (CANCER CENTER ONLY)
Abs Immature Granulocytes: 0.02 10*3/uL (ref 0.00–0.07)
Basophils Absolute: 0 10*3/uL (ref 0.0–0.1)
Basophils Relative: 0 %
Eosinophils Absolute: 0.2 10*3/uL (ref 0.0–0.5)
Eosinophils Relative: 2 %
HCT: 39.5 % (ref 36.0–46.0)
Hemoglobin: 13.5 g/dL (ref 12.0–15.0)
Immature Granulocytes: 0 %
Lymphocytes Relative: 42 %
Lymphs Abs: 4.3 10*3/uL — ABNORMAL HIGH (ref 0.7–4.0)
MCH: 31.8 pg (ref 26.0–34.0)
MCHC: 34.2 g/dL (ref 30.0–36.0)
MCV: 92.9 fL (ref 80.0–100.0)
Monocytes Absolute: 0.5 10*3/uL (ref 0.1–1.0)
Monocytes Relative: 5 %
Neutro Abs: 5.3 10*3/uL (ref 1.7–7.7)
Neutrophils Relative %: 51 %
Platelet Count: 294 10*3/uL (ref 150–400)
RBC: 4.25 MIL/uL (ref 3.87–5.11)
RDW: 12.8 % (ref 11.5–15.5)
WBC Count: 10.4 10*3/uL (ref 4.0–10.5)
nRBC: 0 % (ref 0.0–0.2)

## 2023-02-11 LAB — CMP (CANCER CENTER ONLY)
ALT: 13 U/L (ref 0–44)
AST: 13 U/L — ABNORMAL LOW (ref 15–41)
Albumin: 4.3 g/dL (ref 3.5–5.0)
Alkaline Phosphatase: 51 U/L (ref 38–126)
Anion gap: 12 (ref 5–15)
BUN: 15 mg/dL (ref 6–20)
CO2: 23 mmol/L (ref 22–32)
Calcium: 9 mg/dL (ref 8.9–10.3)
Chloride: 102 mmol/L (ref 98–111)
Creatinine: 0.81 mg/dL (ref 0.44–1.00)
GFR, Estimated: >60 mL/min (ref >60)
Glucose, Bld: 90 mg/dL (ref 70–99)
Potassium: 3.6 mmol/L (ref 3.5–5.1)
Sodium: 137 mmol/L (ref 135–145)
Total Bilirubin: 0.6 mg/dL (ref 0.3–1.2)
Total Protein: 7.7 g/dL (ref 6.5–8.1)

## 2023-02-11 LAB — IRON AND IRON BINDING CAPACITY (CC-WL,HP ONLY)
Iron: 60 ug/dL (ref 28–170)
Saturation Ratios: 15 % (ref 10.4–31.8)
TIBC: 407 ug/dL (ref 250–450)
UIBC: 347 ug/dL (ref 148–442)

## 2023-02-11 LAB — SEDIMENTATION RATE: Sed Rate: 18 mm/hr (ref 0–22)

## 2023-02-11 LAB — FERRITIN: Ferritin: 9 ng/mL — ABNORMAL LOW (ref 11–307)

## 2023-02-11 NOTE — Progress Notes (Signed)
HEMATOLOGY/ONCOLOGY CLINIC NOTE  Date of Service: 02/11/23  Patient Care Team: Agapito Games, MD as PCP - General (Family Medicine)  CHIEF COMPLAINTS/PURPOSE OF CONSULTATION:   Continued evaluation and management of Hodgkin's lymphoma  CURRENT TREATMENT: Active surveillance of classical hodgkins lymphoma   HISTORY OF PRESENTING ILLNESS:   Elizabeth Beasley is a wonderful 42 y.o. female who has been referred to [place] by Dr Linford Arnold for evaluation and management of microcytic anemia/cervical lymphadenopathy.The pt reports that she has a history of iron deficiency but has never had a transfusion. Pt has had two pregnancies and has two children, ages 9 and 98. There was a concern for anemia with her second pregnancy so she was given an erythropoietin shot. She has had a copper IUD for about 2 years and her menstrual cycle has been slightly longer since getting the IUD. She has been taking 28 mg TID PO iron since June. Pt has no known medication allergies and has had a previous removal of Bartholin's gland cyst. Pt has had no known contact with chemicals either through work or hobbies. She has no history of asthma or heavy smoke/aerosol exposure.   Pt did not notice any cervical lymphadenopathy in June. In late July, early August she began to experience extreme fatigue and could begin to feel small lumps in her neck. Her fatigue is improved with taking her PO Iron TID. She has experienced a couple of episodes of night sweats, with the latest occurring in late August. Pt has also had <10 lbs loss in 2 months. She has not had any rashes but is experiencing itching in her palms, broader itching after hot showers.   Pt is currently working from home. She has had her seasonal flu shot. Pt is not planning on having more children and is not concerned about fertility. Pt's husband would like to take a drive to the [facility] next month. Pt would not want any blood product transfusions, as  she is a Jehovah's Witness. She is comfortable with IV iron infusions.   INTERVAL HISTORY:  Elizabeth Beasley is a 42 y.o. female is here for her scheduled follow-up for continued evaluation and management of Hodgkin's lymphoma.   Patient was last seen by me on 08/12/2022 and was doing well overall.   Patient notes she has been doing well overall without any severe medical concerns. She complains of mild lower back pain. She denies any infection issues, fever, chills, night sweats, abdominal pain, chest pain, back pain, unexpected weight loss, or leg swelling.   She notes her menstrual cycle have been normal.   She denies taking iron supplement.    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   Pt is scheduled for surgery       SURGICAL HISTORY: Past Surgical History:  Procedure Laterality Date   CESAREAN SECTION  2010   INTERCOSTAL NERVE BLOCK  10/06/2020   Procedure: INTERCOSTAL NERVE BLOCK;  Surgeon: Loreli Slot, MD;  Location: [facility] OR;  Service: Thoracic;;   IR REMOVAL TUN ACCESS W/ PORT W/O FL MOD SED  01/11/2021   LYMPH NODE BIOPSY Left 10/06/2020   Procedure: MEDIASTINAL LYMPH NODE  BIOPSY;  Surgeon: Loreli Slot, MD;  Location: [facility] OR;  Service: Thoracic;  Laterality: Left;   MASS EXCISION Right 05/21/2019   Procedure: EXCISION OF RIGHT DEEP NECK LYMPH NODE;  Surgeon: Claud Kelp, MD;  Location: [facility] OR;  Service: General;  Laterality: Right;   PORTACATH PLACEMENT Right 06/08/2019   Procedure: INSERTION PORT-A-CATH WITH ULTRASOUND GUIDANCE;  Surgeon: Claud Kelp, MD;  Location: [facility];  Service: General;  Laterality: Right;   URETHRAL CYST REMOVAL N/A  08/30/2016   Procedure: BARTHOLIN'S GLAND CYST EXCISION;  Surgeon: Crist Fat, MD;  Location: [facility];  Service: Urology;  Laterality: N/A;   v back     2012    SOCIAL HISTORY: Social History   Socioeconomic History   Marital status: Married    Spouse name: Derrick   Number of children: 2   Years of education: Assoc   Highest education level: Not on file  Occupational History   Occupation: CMA    Employer: [employer]  Tobacco Use   Smoking status: Never   Smokeless tobacco: Never  Vaping Use   Vaping Use: Never used  Substance and Sexual Activity   Alcohol use: Not Currently    Comment: ocassional   Drug use: No   Sexual activity: Yes    Partners: Male    Birth control/protection: I.U.D.  Other Topics Concern   Not on file  Social History Narrative   Regular exercise. No regular caffeine.   Social Determinants of Health   Financial Resource Strain: Not on file  Food Insecurity: Not on file  Transportation Needs: Not on file  Physical Activity: Not on file  Stress: Not on file  Social Connections: Not on file  Intimate Partner Violence: Not on file    FAMILY HISTORY: Family History  Problem Relation Age of Onset   Colon cancer Father 44   Colon polyps Mother    Hypertension Mother    Diabetes Paternal Grandmother    Stroke Paternal Grandmother    Alcohol abuse Paternal Uncle    Colon cancer Paternal Aunt 41   Esophageal cancer Neg Hx    Rectal cancer Neg Hx    Stomach cancer Neg Hx    On PMHx the pt reports C-section, Bartholin's Gland Cyst removal. On Social Hx the pt reports non smoker, no alcohol use. On Family Hx the pt reports father passed from colon cancer, mother has colon polyps.  ALLERGIES:  is allergic to other.  MEDICATIONS:  Current Outpatient Medications  Medication Sig Dispense Refill   b complex vitamins capsule Take 1 capsule by mouth daily.     cholecalciferol (VITAMIN D3) 25 MCG (1000 UT) tablet Take 2,000  Units by mouth daily.     citalopram (CELEXA) 10 MG tablet TAKE 1 TABLET BY MOUTH DAILY 30 tablet 1   escitalopram (LEXAPRO) 10 MG tablet Take 10 mg by mouth daily.     fluticasone (FLONASE) 50 MCG/ACT nasal spray Place 2 sprays into both nostrils daily as needed for allergies or rhinitis.     iron polysaccharides (NIFEREX) 150 MG capsule Take 1 capsule (150 mg total) by mouth daily. (Patient not taking: Reported on 08/12/2022) 30 capsule 5   loratadine (CLARITIN) 10 MG tablet Take 10 mg by mouth daily.     Polyvinyl Alcohol-Povidone (REFRESH OP) Place 1 drop into both eyes daily as needed (dry eyes).     No current facility-administered medications for this visit.    REVIEW OF SYSTEMS:  10 Point review of Systems was done is negative except as noted above.  PHYSICAL EXAMINATION: ECOG FS:1 - Symptomatic but completely ambulatory  Vitals:   02/11/23 0922  BP: 127/72  Pulse: 78  Resp: 17  Temp: 98.1 F (36.7 C)  SpO2: 100%     Wt Readings from Last 3 Encounters:  08/12/22 231 lb 14.4 oz (105.2 kg)  02/01/22 232 lb 11.2 oz (105.6 kg)  10/05/21 231 lb 14.4 oz (105.2 kg)   There is no height or weight on file to calculate BMI.    GENERAL:alert, in no acute distress and comfortable SKIN: no acute rashes, no significant lesions EYES: conjunctiva are pink and non-injected, sclera anicteric OROPHARYNX: MMM, no exudates, no oropharyngeal erythema or ulceration NECK: supple, no JVD LYMPH:  no palpable lymphadenopathy in the cervical, axillary or inguinal regions LUNGS: clear to auscultation b/l with normal respiratory effort HEART: regular rate & rhythm ABDOMEN:  normoactive bowel sounds , non tender, not distended.no palpable hepatosplenomegaly Extremity: no pedal edema PSYCH: alert & oriented x 3 with fluent speech NEURO: no focal motor/sensory deficits   LABORATORY DATA:  I have reviewed the data as listed .    Latest Ref Rng & Units 02/11/2023    8:46 AM 08/12/2022    10:05 AM 02/01/2022    9:58 AM  CBC  WBC 4.0 - 10.5 K/uL 10.4  9.2  8.6   Hemoglobin 12.0 - 15.0 g/dL 16.1  09.6  04.5   Hematocrit 36.0 - 46.0 % 39.5  38.8  40.0   Platelets 150 - 400 K/uL 294  296  287     .    Latest Ref Rng & Units 02/11/2023    8:46 AM 08/12/2022   10:05 AM 02/01/2022    9:58 AM  CMP  Glucose 70 - 99 mg/dL 90  91  94   BUN 6 - 20 mg/dL 15  14  12    Creatinine 0.44 - 1.00 mg/dL 4.09  8.11  9.14   Sodium 135 - 145 mmol/L 137  139  141   Potassium 3.5 - 5.1 mmol/L 3.6  3.9  3.7   Chloride 98 - 111 mmol/L 102  104  105   CO2 22 - 32 mmol/L 23  28  28    Calcium 8.9 - 10.3 mg/dL 9.0  9.6  9.4   Total Protein 6.5 - 8.1 g/dL 7.7  7.6  7.9   Total Bilirubin 0.3 - 1.2 mg/dL 0.6  0.8  1.2   Alkaline Phos 38 - 126 U/L 51  62  51   AST 15 - 41 U/L 13  12  14    ALT 0 - 44 U/L 13  14  13     Sed rate 26 . Lab Results  Component Value Date   IRON 60 02/11/2023   TIBC 407 02/11/2023   IRONPCTSAT 15 02/11/2023   (Iron and TIBC)  Lab Results  Component Value Date   FERRITIN 9 (L) 02/11/2023    08/13/2019 NM PET Image Restag (PS) Skull Base To Thigh (Accession [id number])   05/21/2019 ([id number]) Lymph Node Biopsy    RADIOGRAPHIC STUDIES: I have personally reviewed the radiological images as listed and agreed with the findings in the report. No results found.  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 2) Microcytic Anemia due to anemia of chronic disease and Iron deficiency- hg improved to 13 3) Jehovah's  Witness   PLAN: -Patient has no clinical symptoms suggestive of Hodgkin's lymphoma progression at this time -Discussed lab results from today, 02/11/2023, with the patient. CBC and CMP are stable. Sedimentation rate is pending.  -No other significant residual toxicities related to her previous chemotherapy at this time. -rtc with Dr Candise Che with labs in 6  months  FOLLOW UP: RTC with Dr Candise Che with labs in 6 months  The total time spent in the appointment was *** minutes* .  All of the patient's questions were answered with apparent satisfaction. The patient knows to call the clinic with any problems, questions or concerns.   Wyvonnia Lora MD MS AAHIVMS [facility] Hematology/Oncology Physician [facility]  .*Total Encounter Time as defined by the Centers for Medicare and Medicaid Services includes, in addition to the face-to-face time of a patient visit (documented in the note above) non-face-to-face time: obtaining and reviewing outside history, ordering and reviewing medications, tests or procedures, care coordination (communications with other health care professionals or caregivers) and documentation in the medical record.   Colonel Bald, am acting as a Neurosurgeon for Wyvonnia Lora, MD.

## 2023-02-12 ENCOUNTER — Telehealth: Payer: Self-pay | Admitting: Hematology

## 2023-02-17 NOTE — Progress Notes (Incomplete)
HEMATOLOGY/ONCOLOGY CLINIC NOTE  Date of Service: 02/11/23  Patient Care Team: Agapito Games, MD as PCP - General (Family Medicine)  CHIEF COMPLAINTS/PURPOSE OF CONSULTATION:   Continued evaluation and management of Hodgkin's lymphoma  CURRENT TREATMENT: Active surveillance of classical hodgkins lymphoma   HISTORY OF PRESENTING ILLNESS:   Elizabeth Beasley is a wonderful 42 y.o. female who has been referred to [place] by Dr Linford Arnold for evaluation and management of microcytic anemia/cervical lymphadenopathy.The pt reports that she has a history of iron deficiency but has never had a transfusion. Pt has had two pregnancies and has two children, ages 62 and 38. There was a concern for anemia with her second pregnancy so she was given an erythropoietin shot. She has had a copper IUD for about 2 years and her menstrual cycle has been slightly longer since getting the IUD. She has been taking 28 mg TID PO iron since June. Pt has no known medication allergies and has had a previous removal of Bartholin's gland cyst. Pt has had no known contact with chemicals either through work or hobbies. She has no history of asthma or heavy smoke/aerosol exposure.   Pt did not notice any cervical lymphadenopathy in June. In late July, early August she began to experience extreme fatigue and could begin to feel small lumps in her neck. Her fatigue is improved with taking her PO Iron TID. She has experienced a couple of episodes of night sweats, with the latest occurring in late August. Pt has also had <10 lbs loss in 2 months. She has not had any rashes but is experiencing itching in her palms, broader itching after hot showers.   Pt is currently working from home. She has had her seasonal flu shot. Pt is not planning on having more children and is not concerned about fertility. Pt's husband would like to take a drive to the [facility] next month. Pt would not want any blood product transfusions, as  she is a Jehovah's Witness. She is comfortable with IV iron infusions.   INTERVAL HISTORY:  Elizabeth Beasley is a 42 y.o. female is here for her scheduled follow-up for continued evaluation and management of Hodgkin's lymphoma.   Patient was last seen by me on 08/12/2022 and was doing well overall.   Patient notes she has been doing well overall without any severe medical concerns. She complains of mild lower back pain. She denies any infection issues, fever, chills, night sweats, abdominal pain, chest pain, back pain, unexpected weight loss, or leg swelling.   She notes her menstrual cycle have been normal.   She denies taking iron supplement.    MEDICAL HISTORY:  Past Medical History:  Diagnosis Date  . Anemia   . Bartholin gland cyst   . Headache   . Hodgkin's lymphoma (HCC)   . 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 Hx of seasonal allergies   . Lymphadenopathy, cervical 05/21/2019  . PONV (postoperative nausea and vomiting)   . 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 CESAREAN SECTION  2010  . INTERCOSTAL NERVE BLOCK  10/06/2020   Procedure: INTERCOSTAL NERVE BLOCK;  Surgeon: Loreli Slot, MD;  Location: [facility] OR;  Service: Thoracic;;  . IR REMOVAL TUN ACCESS W/ PORT W/O FL MOD SED  01/11/2021  . LYMPH NODE BIOPSY Left 10/06/2020   Procedure: MEDIASTINAL LYMPH NODE  BIOPSY;  Surgeon: Loreli Slot, MD;  Location: [facility] OR;  Service: Thoracic;  Laterality: Left;  Marland Kitchen MASS EXCISION Right 05/21/2019   Procedure: EXCISION OF RIGHT DEEP NECK LYMPH NODE;  Surgeon: Claud Kelp, MD;  Location: [facility] OR;  Service: General;  Laterality: Right;  . PORTACATH PLACEMENT Right 06/08/2019   Procedure: INSERTION PORT-A-CATH WITH ULTRASOUND GUIDANCE;  Surgeon: Claud Kelp, MD;  Location: [facility];  Service: General;  Laterality: Right;  . URETHRAL  CYST REMOVAL N/A 08/30/2016   Procedure: BARTHOLIN'S GLAND CYST EXCISION;  Surgeon: Crist Fat, MD;  Location: [facility];  Service: Urology;  Laterality: N/A;  . v back     2012    SOCIAL HISTORY: Social History   Socioeconomic History  . Marital status: Married    Spouse name: Ladene Artist  . Number of children: 2  . Years of education: Assoc  . Highest education level: Not on file  Occupational History  . Occupation: CMA    Employer: [employer]  Tobacco Use  . Smoking status: Never  . Smokeless tobacco: Never  Vaping Use  . Vaping Use: Never used  Substance and Sexual Activity  . Alcohol use: Not Currently    Comment: ocassional  . Drug use: No  . Sexual activity: Yes    Partners: Male    Birth control/protection: I.U.D.  Other Topics Concern  . Not on file  Social History Narrative   Regular exercise. No regular caffeine.   Social Determinants of Health   Financial Resource Strain: Not on file  Food Insecurity: Not on file  Transportation Needs: Not on file  Physical Activity: Not on file  Stress: Not on file  Social Connections: Not on file  Intimate Partner Violence: Not on file    FAMILY HISTORY: Family History  Problem Relation Age of Onset  . Colon cancer Father 23  . Colon polyps Mother   . Hypertension Mother   . Diabetes Paternal Grandmother   . Stroke Paternal Grandmother   . Alcohol abuse Paternal Uncle   . Colon cancer Paternal Aunt 2  . Esophageal cancer Neg Hx   . Rectal cancer Neg Hx   . Stomach cancer Neg Hx    On PMHx the pt reports C-section, Bartholin's Gland Cyst removal. On Social Hx the pt reports non smoker, no alcohol use. On Family Hx the pt reports father passed from colon cancer, mother has colon polyps.  ALLERGIES:  is allergic to other.  MEDICATIONS:  Current Outpatient Medications  Medication Sig Dispense Refill  . b complex vitamins capsule Take 1 capsule by mouth daily.    . cholecalciferol  (VITAMIN D3) 25 MCG (1000 UT) tablet Take 2,000 Units by mouth daily.    . citalopram (CELEXA) 10 MG tablet TAKE 1 TABLET BY MOUTH DAILY 30 tablet 1  . escitalopram (LEXAPRO) 10 MG tablet Take 10 mg by mouth daily.    . fluticasone (FLONASE) 50 MCG/ACT nasal spray Place 2 sprays into both nostrils daily as needed for allergies or rhinitis.    Marland Kitchen iron polysaccharides (NIFEREX) 150 MG capsule Take 1 capsule (150 mg total) by mouth daily. (Patient not taking: Reported on 08/12/2022) 30 capsule 5  . loratadine (CLARITIN) 10 MG tablet Take 10 mg by mouth daily.    . Polyvinyl Alcohol-Povidone (REFRESH OP) Place 1 drop into both eyes daily as needed (dry eyes).     No current facility-administered medications for this visit.    REVIEW OF SYSTEMS:  10 Point review of Systems was done is negative except as noted above.  PHYSICAL EXAMINATION: ECOG FS:1 - Symptomatic but completely ambulatory  Vitals:   02/11/23 0922  BP: 127/72  Pulse: 78  Resp: 17  Temp: 98.1 F (36.7 C)  SpO2: 100%     Wt Readings from Last 3 Encounters:  08/12/22 231 lb 14.4 oz (105.2 kg)  02/01/22 232 lb 11.2 oz (105.6 kg)  10/05/21 231 lb 14.4 oz (105.2 kg)   There is no height or weight on file to calculate BMI.    GENERAL:alert, in no acute distress and comfortable SKIN: no acute rashes, no significant lesions EYES: conjunctiva are pink and non-injected, sclera anicteric OROPHARYNX: MMM, no exudates, no oropharyngeal erythema or ulceration NECK: supple, no JVD LYMPH:  no palpable lymphadenopathy in the cervical, axillary or inguinal regions LUNGS: clear to auscultation b/l with normal respiratory effort HEART: regular rate & rhythm ABDOMEN:  normoactive bowel sounds , non tender, not distended.no palpable hepatosplenomegaly Extremity: no pedal edema PSYCH: alert & oriented x 3 with fluent speech NEURO: no focal motor/sensory deficits   LABORATORY DATA:  I have reviewed the data as listed .    Latest  Ref Rng & Units 02/11/2023    8:46 AM 08/12/2022   10:05 AM 02/01/2022    9:58 AM  CBC  WBC 4.0 - 10.5 K/uL 10.4  9.2  8.6   Hemoglobin 12.0 - 15.0 g/dL 16.1  09.6  04.5   Hematocrit 36.0 - 46.0 % 39.5  38.8  40.0   Platelets 150 - 400 K/uL 294  296  287     .    Latest Ref Rng & Units 02/11/2023    8:46 AM 08/12/2022   10:05 AM 02/01/2022    9:58 AM  CMP  Glucose 70 - 99 mg/dL 90  91  94   BUN 6 - 20 mg/dL 15  14  12    Creatinine 0.44 - 1.00 mg/dL 4.09  8.11  9.14   Sodium 135 - 145 mmol/L 137  139  141   Potassium 3.5 - 5.1 mmol/L 3.6  3.9  3.7   Chloride 98 - 111 mmol/L 102  104  105   CO2 22 - 32 mmol/L 23  28  28    Calcium 8.9 - 10.3 mg/dL 9.0  9.6  9.4   Total Protein 6.5 - 8.1 g/dL 7.7  7.6  7.9   Total Bilirubin 0.3 - 1.2 mg/dL 0.6  0.8  1.2   Alkaline Phos 38 - 126 U/L 51  62  51   AST 15 - 41 U/L 13  12  14    ALT 0 - 44 U/L 13  14  13     Sed rate 26 . Lab Results  Component Value Date   IRON 60 02/11/2023   TIBC 407 02/11/2023   IRONPCTSAT 15 02/11/2023   (Iron and TIBC)  Lab Results  Component Value Date   FERRITIN 9 (L) 02/11/2023    08/13/2019 NM PET Image Restag (PS) Skull Base To Thigh (Accession [id number])   05/21/2019 ([id number]) Lymph Node Biopsy    RADIOGRAPHIC STUDIES: I have personally reviewed the radiological images as listed and agreed with the findings in the report. No results found.  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 2) Microcytic Anemia due to anemia of chronic disease and Iron deficiency- hg improved to 13 3) Jehovah's  Witness   PLAN: -Patient has no clinical symptoms suggestive of Hodgkin's lymphoma progression at this time -Discussed lab results from today, 02/11/2023, with the patient. CBC and CMP are stable. Sedimentation rate is pending.  -No other significant residual toxicities related to her previous chemotherapy at this  time. -rtc with Dr Candise Che with labs in 6 months  FOLLOW UP: RTC with Dr Candise Che with labs in 6 months  The total time spent in the appointment was *** minutes* .  All of the patient's questions were answered with apparent satisfaction. The patient knows to call the clinic with any problems, questions or concerns.   Wyvonnia Lora MD MS AAHIVMS [facility] Hematology/Oncology Physician [facility]  .*Total Encounter Time as defined by the Centers for Medicare and Medicaid Services includes, in addition to the face-to-face time of a patient visit (documented in the note above) non-face-to-face time: obtaining and reviewing outside history, ordering and reviewing medications, tests or procedures, care coordination (communications with other health care professionals or caregivers) and documentation in the medical record.   Colonel Bald, am acting as a Neurosurgeon for Wyvonnia Lora, MD.

## 2023-03-21 ENCOUNTER — Telehealth: Payer: Self-pay

## 2023-03-21 NOTE — Telephone Encounter (Signed)
Attempted to contact pt on several occasions. Left message to : let Elizabeth Beasley know that iron levels are low and we would recommend continue po iron replacement.  Left pt contact information if she has questions.

## 2023-05-29 ENCOUNTER — Ambulatory Visit: Payer: PRIVATE HEALTH INSURANCE | Admitting: Obstetrics & Gynecology

## 2023-05-29 ENCOUNTER — Other Ambulatory Visit (HOSPITAL_COMMUNITY)
Admission: RE | Admit: 2023-05-29 | Discharge: 2023-05-29 | Disposition: A | Discharge status: Home / Self Care | Payer: PRIVATE HEALTH INSURANCE | Source: Ambulatory Visit | Attending: Obstetrics & Gynecology | Admitting: Obstetrics & Gynecology

## 2023-05-29 ENCOUNTER — Encounter: Payer: Self-pay | Admitting: Obstetrics & Gynecology

## 2023-05-29 VITALS — BP 130/72 | HR 86

## 2023-05-29 DIAGNOSIS — N920 Excessive and frequent menstruation with regular cycle: Secondary | ICD-10-CM | POA: Diagnosis present

## 2023-05-29 NOTE — Progress Notes (Signed)
RGYN pt here for problem visit having heavy cycles soaking heavy plus pad in 2 hrs and clots. Denies any pain.  Notes mild bleeding today.  Pt had labs drawn in 01/2023 pt states iron may be low because of heavy bleeding.   Pt also notes Hx of fibroids  Last pap: 04/26/20 WNL .

## 2023-05-29 NOTE — Progress Notes (Signed)
History:  [name] is a 42 y.o. [id number] who presents to clinic today for heavy menstrual cycles. She notes that she has always had heavy cycles but recently it has worsened and she is worried that it may be a contributing factor to her mildly low ferritin on labs in May. She has been taking 150 mg iron supplements since then. She denies any abnormal discharge, pain, intermenstrual spotting, chills, headache, dizziness, chest pain, shortness of breath.  The following portions of the patient's history were reviewed and updated as appropriate: allergies, current medications, family history, past medical history, social history, past surgical history and problem list.  Review of Systems:  ROS As stated above    Objective:  Physical Exam BP 130/72   Pulse 86   LMP 05/26/2023 (Exact Date)  Physical Exam Vitals reviewed. Exam conducted with a chaperone present.  Constitutional:      General: She is not in acute distress.    Appearance: She is not ill-appearing or diaphoretic.  HENT:     Head: Normocephalic and atraumatic.  Cardiovascular:     Pulses: Normal pulses.     Heart sounds: Normal heart sounds.  Pulmonary:     Effort: Pulmonary effort is normal.     Breath sounds: Normal breath sounds.  Genitourinary:    General: Normal vulva.     Pubic Area: No rash.      Comments: Pelvic exam: Normal appearing external genitalia and urethral meatus; normal appearing vaginal mucosa and cervix.  Blood noted from cervix and in the vault. Pap smear obtained. Musculoskeletal:     Cervical back: Normal range of motion.  Skin:    General: Skin is warm and dry.     Capillary Refill: Capillary refill takes less than 2 seconds.  Neurological:     Mental Status: She is alert and oriented to person, place, and time.       Labs and Imaging   Latest Reference Range & Units 02/11/23 08:46  Iron 28 - 170 ug/dL 60  UIBC 478 - 295 ug/dL 621  TIBC 308 - 657 ug/dL 846  Saturation  Ratios 10.4 - 31.8 % 15  Ferritin 11 - 307 ng/mL 9 (L)  WBC 4.0 - 10.5 K/uL 10.4  RBC 3.87 - 5.11 MIL/uL 4.25  Hemoglobin 12.0 - 15.0 g/dL 96.2  HCT 95.2 - 84.1 % 39.5  MCV 80.0 - 100.0 fL 92.9  MCH 26.0 - 34.0 pg 31.8  MCHC 30.0 - 36.0 g/dL 32.4  RDW 40.1 - 02.7 % 12.8  Platelets 150 - 400 K/uL 294  (L): Data is abnormally low   Health Maintenance Due  Topic Date Due   Hepatitis C Screening  Never done   INFLUENZA VACCINE  04/17/2023   COVID-19 Vaccine (4 - 2023-24 season) 05/18/2023   Labs, imaging and previous visits in Epic and Care Everywhere reviewed  Assessment & Plan:  Menorrhagia with regular cycle Given no additional symptoms and normal pelvic exam labs not as concerned for acute pathology at this moment. Ordered pelvic ultrasound and completed pap smear today. Given PMH of Hodgkins lymphoma patient is not a candidate for hormonal therapy. Will consider ablation if heavy bleeding continues after ultrasound results. Decreased ferritin likely corrected by po iron supplement, will recheck CBC and Ferritin today.  -     Ferritin -     CBC -     US PELVIC COMPLETE WITH TRANSVAGINAL; Future -     Cytology - PAP  Hancock, Mia  M, Medical Student 05/29/2023 4:49 PM   Attestation of Attending Supervision of Student:  I confirm that I have verified the information documented in the medical student's note and that I have also personally reperformed the history, physical exam and all medical decision making activities.  I have verified that all services and findings are accurately documented in this student's note; and I agree with management and plan as outlined in the documentation. I have also made any necessary editorial changes.   Jaynie Collins, MD, FACOG Attending Obstetrician & Gynecologist, [facility] for [employer], [facility]

## 2023-05-30 LAB — CBC
Hematocrit: 39.6 % (ref 34.0–46.6)
Hemoglobin: 12.9 g/dL (ref 11.1–15.9)
MCH: 31.4 pg (ref 26.6–33.0)
MCHC: 32.6 g/dL (ref 31.5–35.7)
MCV: 96 fL (ref 79–97)
Platelets: 292 10*3/uL (ref 150–450)
RBC: 4.11 x10E6/uL (ref 3.77–5.28)
RDW: 13 % (ref 11.7–15.4)
WBC: 9.2 10*3/uL (ref 3.4–10.8)

## 2023-05-30 LAB — FERRITIN: Ferritin: 47 ng/mL (ref 15–150)

## 2023-06-04 ENCOUNTER — Encounter: Payer: Self-pay | Admitting: Obstetrics & Gynecology

## 2023-06-05 ENCOUNTER — Ambulatory Visit (HOSPITAL_COMMUNITY): Payer: PRIVATE HEALTH INSURANCE

## 2023-06-10 LAB — CYTOLOGY - PAP
Comment: NEGATIVE
Diagnosis: NEGATIVE
High risk HPV: NEGATIVE

## 2023-08-04 ENCOUNTER — Other Ambulatory Visit: Payer: Self-pay

## 2023-08-04 DIAGNOSIS — C8110 Nodular sclerosis classical Hodgkin lymphoma, unspecified site: Secondary | ICD-10-CM

## 2023-08-05 ENCOUNTER — Inpatient Hospital Stay: Payer: PRIVATE HEALTH INSURANCE | Attending: Hematology

## 2023-08-05 ENCOUNTER — Inpatient Hospital Stay (HOSPITAL_BASED_OUTPATIENT_CLINIC_OR_DEPARTMENT_OTHER): Payer: PRIVATE HEALTH INSURANCE | Admitting: Hematology

## 2023-08-05 VITALS — BP 120/76 | HR 80 | Temp 97.7°F | Resp 14 | Ht 64.0 in | Wt 245.5 lb

## 2023-08-05 DIAGNOSIS — Z800 Family history of malignant neoplasm of digestive organs: Secondary | ICD-10-CM | POA: Insufficient documentation

## 2023-08-05 DIAGNOSIS — K5900 Constipation, unspecified: Secondary | ICD-10-CM | POA: Insufficient documentation

## 2023-08-05 DIAGNOSIS — C8110 Nodular sclerosis classical Hodgkin lymphoma, unspecified site: Secondary | ICD-10-CM

## 2023-08-05 DIAGNOSIS — D509 Iron deficiency anemia, unspecified: Secondary | ICD-10-CM | POA: Diagnosis not present

## 2023-08-05 DIAGNOSIS — R1031 Right lower quadrant pain: Secondary | ICD-10-CM | POA: Diagnosis not present

## 2023-08-05 DIAGNOSIS — C8170 Other classical Hodgkin lymphoma, unspecified site: Secondary | ICD-10-CM | POA: Diagnosis present

## 2023-08-05 LAB — CMP (CANCER CENTER ONLY)
ALT: 14 U/L (ref 0–44)
AST: 12 U/L — ABNORMAL LOW (ref 15–41)
Albumin: 4.1 g/dL (ref 3.5–5.0)
Alkaline Phosphatase: 53 U/L (ref 38–126)
Anion gap: 8 (ref 5–15)
BUN: 16 mg/dL (ref 6–20)
CO2: 23 mmol/L (ref 22–32)
Calcium: 8.9 mg/dL (ref 8.9–10.3)
Chloride: 106 mmol/L (ref 98–111)
Creatinine: 0.75 mg/dL (ref 0.44–1.00)
GFR, Estimated: >60 mL/min (ref >60)
Glucose, Bld: 82 mg/dL (ref 70–99)
Potassium: 3.8 mmol/L (ref 3.5–5.1)
Sodium: 137 mmol/L (ref 135–145)
Total Bilirubin: 0.7 mg/dL (ref <1.2)
Total Protein: 7.5 g/dL (ref 6.5–8.1)

## 2023-08-05 LAB — CBC WITH DIFFERENTIAL (CANCER CENTER ONLY)
Abs Immature Granulocytes: 0.02 10*3/uL (ref 0.00–0.07)
Basophils Absolute: 0 10*3/uL (ref 0.0–0.1)
Basophils Relative: 0 %
Eosinophils Absolute: 0.2 10*3/uL (ref 0.0–0.5)
Eosinophils Relative: 2 %
HCT: 40.4 % (ref 36.0–46.0)
Hemoglobin: 13.5 g/dL (ref 12.0–15.0)
Immature Granulocytes: 0 %
Lymphocytes Relative: 41 %
Lymphs Abs: 3.3 10*3/uL (ref 0.7–4.0)
MCH: 31.7 pg (ref 26.0–34.0)
MCHC: 33.4 g/dL (ref 30.0–36.0)
MCV: 94.8 fL (ref 80.0–100.0)
Monocytes Absolute: 0.4 10*3/uL (ref 0.1–1.0)
Monocytes Relative: 5 %
Neutro Abs: 4.2 10*3/uL (ref 1.7–7.7)
Neutrophils Relative %: 52 %
Platelet Count: 275 10*3/uL (ref 150–400)
RBC: 4.26 MIL/uL (ref 3.87–5.11)
RDW: 12.4 % (ref 11.5–15.5)
WBC Count: 8.1 10*3/uL (ref 4.0–10.5)
nRBC: 0 % (ref 0.0–0.2)

## 2023-08-05 LAB — IRON AND IRON BINDING CAPACITY (CC-WL,HP ONLY)
Iron: 87 ug/dL (ref 28–170)
Saturation Ratios: 24 % (ref 10.4–31.8)
TIBC: 357 ug/dL (ref 250–450)
UIBC: 270 ug/dL (ref 148–442)

## 2023-08-05 LAB — SEDIMENTATION RATE: Sed Rate: 16 mm/h (ref 0–22)

## 2023-08-05 LAB — FERRITIN: Ferritin: 18 ng/mL (ref 11–307)

## 2023-08-05 NOTE — Progress Notes (Signed)
HEMATOLOGY/ONCOLOGY CLINIC NOTE  Date of Service: 08/05/23  Patient Care Team: Verlon Au, MD as PCP - General (Family Medicine)  CHIEF COMPLAINTS/PURPOSE OF CONSULTATION:   Continued evaluation and management of Hodgkin's lymphoma  CURRENT TREATMENT: Active surveillance of classical hodgkins lymphoma   HISTORY OF PRESENTING ILLNESS:   Elizabeth Beasley is a wonderful 42 y.o. female who has been referred to [place] by Dr Linford Arnold for evaluation and management of microcytic anemia/cervical lymphadenopathy.The pt reports that she has a history of iron deficiency but has never had a transfusion. Pt has had two pregnancies and has two children, ages 56 and 2. There was a concern for anemia with her second pregnancy so she was given an erythropoietin shot. She has had a copper IUD for about 2 years and her menstrual cycle has been slightly longer since getting the IUD. She has been taking 28 mg TID PO iron since June. Pt has no known medication allergies and has had a previous removal of Bartholin's gland cyst. Pt has had no known contact with chemicals either through work or hobbies. She has no history of asthma or heavy smoke/aerosol exposure.   Pt did not notice any cervical lymphadenopathy in June. In late July, early August she began to experience extreme fatigue and could begin to feel small lumps in her neck. Her fatigue is improved with taking her PO Iron TID. She has experienced a couple of episodes of night sweats, with the latest occurring in late August. Pt has also had <10 lbs loss in 2 months. She has not had any rashes but is experiencing itching in her palms, broader itching after hot showers.   Pt is currently working from home. She has had her seasonal flu shot. Pt is not planning on having more children and is not concerned about fertility. Pt's husband would like to take a drive to the [facility] next month. Pt would not want any blood product transfusions, as  she is a Jehovah's Witness. She is comfortable with IV iron infusions.   INTERVAL HISTORY:  Elizabeth Beasley is a 42 y.o. female is here for her scheduled follow-up for continued evaluation and management of Hodgkin's lymphoma.   Patient was last seen by me on 02/11/2023 and she complained of mild lower back pain, but was doing well overall.   Patient is accompanied by her husband during this visit. Patient notes she has been doing well overall since our last visit. She denies any new infection issues, fever, chills, night sweats, unexpected weight loss, chest pain, abdominal pain, back pain, or leg swelling. She does complain of right lower abdominal pain, which she attributes it to fibroids.   She notes that her menstrual cycle last 7 days and they are heavy for the first 3 days. She has been following up with her OBGYN regarding heavy menstrual cycle.   She has been taking iron polysaccharides 150 mg, which has been causing her mild constipation.    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   Pt is scheduled for surgery       SURGICAL HISTORY: Past Surgical History:  Procedure Laterality Date   CESAREAN  SECTION  2010   INTERCOSTAL NERVE BLOCK  10/06/2020   Procedure: INTERCOSTAL NERVE BLOCK;  Surgeon: Loreli Slot, MD;  Location[facility];  Service: Thoracic;;   IR REMOVAL TUN ACCESS W/ PORT W/O FL MOD SED  01/11/2021   LYMPH NODE BIOPSY Left 10/06/2020   Procedure: MEDIASTINAL LYMPH NODE BIOPSY;  Surgeon: Loreli Slot, MD;  Location: [facility] OR;  Service: Thoracic;  Laterality: Left;   MASS EXCISION Right 05/21/2019   Procedure: EXCISION OF RIGHT DEEP NECK LYMPH NODE;  Surgeon:  Claud Kelp, MD;  Location: [facility] OR;  Service: General;  Laterality: Right;   PORTACATH PLACEMENT Right 06/08/2019   Procedure: INSERTION PORT-A-CATH WITH ULTRASOUND GUIDANCE;  Surgeon: Claud Kelp, MD;  Location: [facility];  Service: General;  Laterality: Right;   URETHRAL CYST REMOVAL N/A 08/30/2016   Procedure: BARTHOLIN'S GLAND CYST EXCISION;  Surgeon: Crist Fat, MD;  Location: [facility];  Service: Urology;  Laterality: N/A;   v back     2012    SOCIAL HISTORY: Social History   Socioeconomic History   Marital status: Married    Spouse name: Derrick   Number of children: 2   Years of education: Assoc   Highest education level: Not on file  Occupational History   Occupation: CMA    Employer: [employer]  Tobacco Use   Smoking status: Never   Smokeless tobacco: Never  Vaping Use   Vaping status: Never Used  Substance and Sexual Activity   Alcohol use: Not Currently    Comment: ocassional   Drug use: No   Sexual activity: Yes    Partners: Male    Birth control/protection: I.U.D.  Other Topics Concern   Not on file  Social History Narrative   Regular exercise. No regular caffeine.   Social Determinants of Health   Financial Resource Strain: Not on file  Food Insecurity: Not on file  Transportation Needs: Not on file  Physical Activity: Not on file  Stress: Not on file  Social Connections: Unknown (01/22/2022)   Received from [facility]   Social Network    Social Network: Not on file  Intimate Partner Violence: Unknown (12/19/2021)   [facility], [facility]   HITS    Physically Hurt: Not on file    Insult or Talk Down To: Not on file    Threaten Physical Harm: Not on file    Scream or Curse: Not on file    FAMILY HISTORY: Family History  Problem Relation Age of Onset   Colon cancer Father 48   Colon polyps Mother    Hypertension Mother    Diabetes Paternal Grandmother    Stroke  Paternal Grandmother    Alcohol abuse Paternal Uncle    Colon cancer Paternal Aunt 76   Esophageal cancer Neg Hx    Rectal cancer Neg Hx    Stomach cancer Neg Hx    On PMHx the pt reports C-section, Bartholin's Gland Cyst removal. On Social Hx the pt reports non smoker, no alcohol use. On Family Hx the pt reports father passed from colon cancer, mother has colon polyps.  ALLERGIES:  is allergic to other.  MEDICATIONS:  Current Outpatient Medications  Medication Sig Dispense Refill   b complex vitamins capsule Take 1 capsule by mouth daily.     cholecalciferol (VITAMIN D3) 25 MCG (1000 UT) tablet Take 2,000 Units by mouth daily.     citalopram (CELEXA) 10 MG tablet TAKE  1 TABLET BY MOUTH DAILY 30 tablet 1   escitalopram (LEXAPRO) 10 MG tablet Take 10 mg by mouth daily.     fluticasone (FLONASE) 50 MCG/ACT nasal spray Place 2 sprays into both nostrils daily as needed for allergies or rhinitis.     iron polysaccharides (NIFEREX) 150 MG capsule Take 1 capsule (150 mg total) by mouth daily. (Patient not taking: Reported on 08/12/2022) 30 capsule 5   loratadine (CLARITIN) 10 MG tablet Take 10 mg by mouth daily.     Polyvinyl Alcohol-Povidone (REFRESH OP) Place 1 drop into both eyes daily as needed (dry eyes).     No current facility-administered medications for this visit.    REVIEW OF SYSTEMS:   10 Point review of Systems was done is negative except as noted above.  PHYSICAL EXAMINATION: ECOG FS:1 - Symptomatic but completely ambulatory  Vitals:   08/05/23 0934  BP: 120/76  Pulse: 80  Resp: 14  Temp: 97.7 F (36.5 C)  SpO2: 99%   Wt Readings from Last 3 Encounters:  02/11/23 237 lb 4.8 oz (107.6 kg)  08/12/22 231 lb 14.4 oz (105.2 kg)  02/01/22 232 lb 11.2 oz (105.6 kg)   Body mass index is 42.14 kg/m.    GENERAL:alert, in no acute distress and comfortable SKIN: no acute rashes, no significant lesions EYES: conjunctiva are pink and non-injected, sclera  anicteric OROPHARYNX: MMM, no exudates, no oropharyngeal erythema or ulceration NECK: supple, no JVD LYMPH:  no palpable lymphadenopathy in the cervical, axillary or inguinal regions LUNGS: clear to auscultation b/l with normal respiratory effort HEART: regular rate & rhythm ABDOMEN:  normoactive bowel sounds , non tender, not distended.no palpable hepatosplenomegaly Extremity: no pedal edema PSYCH: alert & oriented x 3 with fluent speech NEURO: no focal motor/sensory deficits   LABORATORY DATA:  I have reviewed the data as listed .    Latest Ref Rng & Units 08/05/2023    9:14 AM 05/29/2023    4:40 PM 02/11/2023    8:46 AM  CBC  WBC 4.0 - 10.5 K/uL 8.1  9.2  10.4   Hemoglobin 12.0 - 15.0 g/dL 16.1  09.6  04.5   Hematocrit 36.0 - 46.0 % 40.4  39.6  39.5   Platelets 150 - 400 K/uL 275  292  294     .    Latest Ref Rng & Units 08/05/2023    9:14 AM 02/11/2023    8:46 AM 08/12/2022   10:05 AM  CMP  Glucose 70 - 99 mg/dL 82  90  91   BUN 6 - 20 mg/dL 16  15  14    Creatinine 0.44 - 1.00 mg/dL 4.09  8.11  9.14   Sodium 135 - 145 mmol/L 137  137  139   Potassium 3.5 - 5.1 mmol/L 3.8  3.6  3.9   Chloride 98 - 111 mmol/L 106  102  104   CO2 22 - 32 mmol/L 23  23  28    Calcium 8.9 - 10.3 mg/dL 8.9  9.0  9.6   Total Protein 6.5 - 8.1 g/dL 7.5  7.7  7.6   Total Bilirubin <1.2 mg/dL 0.7  0.6  0.8   Alkaline Phos 38 - 126 U/L 53  51  62   AST 15 - 41 U/L 12  13  12    ALT 0 - 44 U/L 14  13  14     Sed rate 26 . Lab Results  Component Value Date   IRON 87 08/05/2023  TIBC 357 08/05/2023   IRONPCTSAT 24 08/05/2023   (Iron and TIBC)  Lab Results  Component Value Date   FERRITIN 18 08/05/2023    08/13/2019 NM PET Image Restag (PS) Skull Base To Thigh (Accession [id number])   05/21/2019 ([id number]) Lymph Node Biopsy    RADIOGRAPHIC STUDIES: I have personally reviewed the radiological images as listed and agreed with the findings in the report. No results  found.  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 2) Microcytic Anemia due to anemia of chronic disease and Iron deficiency- hg improved to 13 3) Jehovah's Witness   PLAN: -Patient has no clinical symptoms suggestive of Hodgkin's lymphoma progression at this time -Discussed lab results from today, 08/05/2023, in detail with the patient. CBC is stable. CMP stable.  -No other significant residual toxicities related to her previous chemotherapy at this time. -continue Iron polysaccharide 150mg  po daily -recommended continued f/u with ObGyn to address her heavier periods. -rtc with Dr Candise Che with labs in 6 months  FOLLOW UP: RTC with Dr Candise Che with labs in 6 months  The total time spent in the appointment was 21 minutes* .  All of the patient's questions were answered with apparent satisfaction. The patient knows to call the clinic with any problems, questions or concerns.   Wyvonnia Lora MD MS AAHIVMS [facility] Hematology/Oncology Physician [facility]  .*Total Encounter Time as defined by the Centers for Medicare and Medicaid Services includes, in addition to the face-to-face time of a patient visit (documented in the note above) non-face-to-face time: obtaining and reviewing outside history, ordering and reviewing medications, tests or procedures, care coordination (communications with other health care professionals or caregivers) and documentation in the medical record.   I,Param Shah,acting as a Neurosurgeon for Wyvonnia Lora, MD.,have documented all relevant documentation on the behalf of Wyvonnia Lora, MD,as directed by  Wyvonnia Lora, MD while in the presence of Wyvonnia Lora, MD.  .I have reviewed the above documentation for accuracy and completeness, and I agree with the above. Johney Maine MD

## 2023-08-07 ENCOUNTER — Telehealth: Payer: Self-pay | Admitting: Hematology

## 2023-08-07 ENCOUNTER — Ambulatory Visit
Admission: RE | Admit: 2023-08-07 | Discharge: 2023-08-07 | Disposition: A | Discharge status: Home / Self Care | Payer: PRIVATE HEALTH INSURANCE | Source: Ambulatory Visit | Attending: Obstetrics & Gynecology | Admitting: Obstetrics & Gynecology

## 2023-08-07 DIAGNOSIS — N920 Excessive and frequent menstruation with regular cycle: Secondary | ICD-10-CM | POA: Insufficient documentation

## 2023-08-07 NOTE — Telephone Encounter (Signed)
 Left patient a message in regards to scheduled appointment times/dates

## 2024-02-03 ENCOUNTER — Other Ambulatory Visit: Payer: Self-pay | Admitting: *Deleted

## 2024-02-03 DIAGNOSIS — C8110 Nodular sclerosis classical Hodgkin lymphoma, unspecified site: Secondary | ICD-10-CM

## 2024-02-04 ENCOUNTER — Inpatient Hospital Stay: Payer: PRIVATE HEALTH INSURANCE | Attending: Hematology

## 2024-02-04 ENCOUNTER — Inpatient Hospital Stay (HOSPITAL_BASED_OUTPATIENT_CLINIC_OR_DEPARTMENT_OTHER): Payer: PRIVATE HEALTH INSURANCE | Admitting: Hematology

## 2024-02-04 VITALS — BP 116/75 | HR 77 | Temp 97.2°F | Resp 20 | Wt 232.1 lb

## 2024-02-04 DIAGNOSIS — C8110 Nodular sclerosis classical Hodgkin lymphoma, unspecified site: Secondary | ICD-10-CM

## 2024-02-04 DIAGNOSIS — C8170 Other classical Hodgkin lymphoma, unspecified site: Secondary | ICD-10-CM | POA: Insufficient documentation

## 2024-02-04 DIAGNOSIS — Z800 Family history of malignant neoplasm of digestive organs: Secondary | ICD-10-CM | POA: Diagnosis not present

## 2024-02-04 DIAGNOSIS — D509 Iron deficiency anemia, unspecified: Secondary | ICD-10-CM | POA: Diagnosis not present

## 2024-02-04 LAB — CBC WITH DIFFERENTIAL (CANCER CENTER ONLY)
Abs Immature Granulocytes: 0.01 10*3/uL (ref 0.00–0.07)
Basophils Absolute: 0.1 10*3/uL (ref 0.0–0.1)
Basophils Relative: 1 %
Eosinophils Absolute: 0.3 10*3/uL (ref 0.0–0.5)
Eosinophils Relative: 3 %
HCT: 37.6 % (ref 36.0–46.0)
Hemoglobin: 12.9 g/dL (ref 12.0–15.0)
Immature Granulocytes: 0 %
Lymphocytes Relative: 44 %
Lymphs Abs: 3.9 10*3/uL (ref 0.7–4.0)
MCH: 31.5 pg (ref 26.0–34.0)
MCHC: 34.3 g/dL (ref 30.0–36.0)
MCV: 91.9 fL (ref 80.0–100.0)
Monocytes Absolute: 0.5 10*3/uL (ref 0.1–1.0)
Monocytes Relative: 5 %
Neutro Abs: 4.2 10*3/uL (ref 1.7–7.7)
Neutrophils Relative %: 47 %
Platelet Count: 274 10*3/uL (ref 150–400)
RBC: 4.09 MIL/uL (ref 3.87–5.11)
RDW: 12.7 % (ref 11.5–15.5)
WBC Count: 8.8 10*3/uL (ref 4.0–10.5)
nRBC: 0 % (ref 0.0–0.2)

## 2024-02-04 LAB — IRON AND IRON BINDING CAPACITY (CC-WL,HP ONLY)
Iron: 42 ug/dL (ref 28–170)
Saturation Ratios: 12 % (ref 10.4–31.8)
TIBC: 343 ug/dL (ref 250–450)
UIBC: 301 ug/dL (ref 148–442)

## 2024-02-04 LAB — CMP (CANCER CENTER ONLY)
ALT: 13 U/L (ref 0–44)
AST: 12 U/L — ABNORMAL LOW (ref 15–41)
Albumin: 4.2 g/dL (ref 3.5–5.0)
Alkaline Phosphatase: 60 U/L (ref 38–126)
Anion gap: 8 (ref 5–15)
BUN: 12 mg/dL (ref 6–20)
CO2: 27 mmol/L (ref 22–32)
Calcium: 9.1 mg/dL (ref 8.9–10.3)
Chloride: 103 mmol/L (ref 98–111)
Creatinine: 0.71 mg/dL (ref 0.44–1.00)
GFR, Estimated: >60 mL/min (ref >60)
Glucose, Bld: 83 mg/dL (ref 70–99)
Potassium: 3.6 mmol/L (ref 3.5–5.1)
Sodium: 138 mmol/L (ref 135–145)
Total Bilirubin: 0.7 mg/dL (ref 0.0–1.2)
Total Protein: 7.3 g/dL (ref 6.5–8.1)

## 2024-02-04 LAB — SEDIMENTATION RATE: Sed Rate: 15 mm/h (ref 0–22)

## 2024-02-04 LAB — FERRITIN: Ferritin: 28 ng/mL (ref 11–307)

## 2024-02-04 NOTE — Progress Notes (Signed)
 HEMATOLOGY/ONCOLOGY CLINIC NOTE  Date of Service: 02/04/24  Patient Care Team: Jacqulyne Maxim, MD as PCP - General (Family Medicine)  CHIEF COMPLAINTS/PURPOSE OF CONSULTATION:   Continued evaluation and management of Hodgkin's lymphoma  CURRENT TREATMENT: Active surveillance of classical hodgkins lymphoma   HISTORY OF PRESENTING ILLNESS:   Elizabeth Beasley is a wonderful 43 y.o. female who has been referred to us  by Dr Greer Leak for evaluation and management of microcytic anemia/cervical lymphadenopathy.The pt reports that she has a history of iron  deficiency but has never had a transfusion. Pt has had two pregnancies and has two children, ages 49 and 31. There was a concern for anemia with her second pregnancy so she was given an erythropoietin shot. She has had a copper  IUD for about 2 years and her menstrual cycle has been slightly longer since getting the IUD. She has been taking 28 mg TID PO iron  since June. Pt has no known medication allergies and has had a previous removal of Bartholin's gland cyst. Pt has had no known contact with chemicals either through work or hobbies. She has no history of asthma or heavy smoke/aerosol exposure.   Pt did not notice any cervical lymphadenopathy in June. In late July, early August she began to experience extreme fatigue and could begin to feel small lumps in her neck. Her fatigue is improved with taking her PO Iron  TID. She has experienced a couple of episodes of night sweats, with the latest occurring in late August. Pt has also had <10 lbs loss in 2 months. She has not had any rashes but is experiencing itching in her palms, broader itching after hot showers.   Pt is currently working from home. She has had her seasonal flu shot. Pt is not planning on having more children and is not concerned about fertility. Pt's husband would like to take a drive to the [place] next month. Pt would not want any blood product transfusions, as  she is a Jehovah's Witness. She is comfortable with IV iron  infusions.   INTERVAL HISTORY:  Elizabeth Beasley is a 43 y.o. female here for her scheduled follow-up for continued evaluation and management of Hodgkin's lymphoma.   Patient was last seen by me on 08/05/2023 and complained of right lower abdominal pain, heaviness with menstrual cycles, and mild constipation from oral iron .  She reports that she has been doing well overall over the last 6 months with no new concerns. Patient denies any fever, chills, night sweats, new lumps/bumps, abdominal pain, chest pain, leg swelling, SOB, or other new localized symptoms. Her energy levels have been normal.  She reports that she takes 150 MG oral iron  only during her periods. She reports that oral iron  causes constipation, though symptoms are not significant. She does also take a multivitamin regularly, which does have iron  component.   Patient reports that the heaviness of her menstural cycles fluctuates. At times, her periods are light, and other times very heavy lasting several days.She denies any ice cravings, cramping or discomfort with menstrual cycles.   She continues to take vitamin B12  She is on Citalopram . Patient denies any new medications  She will be traveling to [place]  some time this year.   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   Pt is scheduled for surgery       SURGICAL HISTORY: Past Surgical History:  Procedure Laterality Date   CESAREAN SECTION  2010   INTERCOSTAL NERVE BLOCK  10/06/2020   Procedure: INTERCOSTAL NERVE BLOCK;  Surgeon: Zelphia Higashi, MD;  Location: [facility] OR;  Service: Thoracic;;   IR REMOVAL TUN ACCESS  W/ PORT W/O FL MOD SED  01/11/2021   LYMPH NODE BIOPSY Left 10/06/2020   Procedure: MEDIASTINAL LYMPH NODE BIOPSY;  Surgeon: Zelphia Higashi, MD;  Location: [facility] OR;  Service: Thoracic;  Laterality: Left;   MASS EXCISION Right 05/21/2019   Procedure: EXCISION OF RIGHT DEEP NECK LYMPH NODE;  Surgeon: Boyce Byes, MD;  Location: [facility] OR;  Service: General;  Laterality: Right;   PORTACATH PLACEMENT Right 06/08/2019   Procedure: INSERTION PORT-A-CATH WITH ULTRASOUND GUIDANCE;  Surgeon: Boyce Byes, MD;  Location: [facility];  Service: General;  Laterality: Right;   URETHRAL CYST REMOVAL N/A 08/30/2016   Procedure: BARTHOLIN'S GLAND CYST EXCISION;  Surgeon: Andrez Banker, MD;  Location[facility];  Service: Urology;  Laterality: N/A;   v back     2012    SOCIAL HISTORY: Social History   Socioeconomic History   Marital status: Married    Spouse name: Derrick   Number of children: 2   Years of education: Assoc   Highest education level: Not on file  Occupational History   Occupation: CMA    Employer: [employer]  Tobacco Use   Smoking status: Never   Smokeless tobacco: Never  Vaping Use   Vaping status: Never Used  Substance and Sexual Activity   Alcohol use: Not Currently    Comment: ocassional   Drug use: No   Sexual activity: Yes    Partners: Male    Birth control/protection: I.U.D.  Other Topics Concern   Not on file  Social History Narrative   Regular exercise. No regular caffeine.   Social Drivers of Corporate investment banker Strain: Not on file  Food Insecurity: Low Risk  (11/11/2023)   Received from [facility]   Hunger Vital Sign    Worried About Running Out of Food in the Last Year: Never true    Ran Out of Food in the Last Year: Never true  Transportation Needs: No Transportation Needs (11/11/2023)   Received from [facility]    In the past 12 months, has lack of reliable transportation kept you from  medical appointments, meetings, work or from getting things needed for daily living? : No  Physical Activity: Not on file  Stress: Not on file  Social Connections: Unknown (01/22/2022)   Received from [facility]   Social Network    Social Network: Not on file  Intimate Partner Violence: Unknown (12/19/2021)   Received from [facility]   HITS    Physically Hurt: Not on file    Insult or Talk Down To: Not on file    Threaten Physical Harm: Not on file    Scream or Curse: Not on file    FAMILY HISTORY: Family History  Problem Relation Age of Onset   Colon cancer Father 48   Colon polyps Mother    Hypertension Mother    Diabetes Paternal Grandmother    Stroke Paternal Grandmother    Alcohol abuse Paternal Uncle    Colon  cancer Paternal Aunt 75   Esophageal cancer Neg Hx    Rectal cancer Neg Hx    Stomach cancer Neg Hx    On PMHx the pt reports C-section, Bartholin's Gland Cyst removal. On Social Hx the pt reports non smoker, no alcohol use. On Family Hx the pt reports father passed from colon cancer, mother has colon polyps.  ALLERGIES:  is allergic to other.  MEDICATIONS:  Current Outpatient Medications  Medication Sig Dispense Refill   b complex vitamins capsule Take 1 capsule by mouth daily.     cholecalciferol (VITAMIN D3) 25 MCG (1000 UT) tablet Take 2,000 Units by mouth daily.     citalopram  (CELEXA ) 10 MG tablet TAKE 1 TABLET BY MOUTH DAILY 30 tablet 1   escitalopram (LEXAPRO) 10 MG tablet Take 10 mg by mouth daily.     fluticasone  (FLONASE ) 50 MCG/ACT nasal spray Place 2 sprays into both nostrils daily as needed for allergies or rhinitis.     iron  polysaccharides (NIFEREX) 150 MG capsule Take 1 capsule (150 mg total) by mouth daily. (Patient not taking: Reported on 08/12/2022) 30 capsule 5   loratadine  (CLARITIN ) 10 MG tablet Take 10 mg by mouth daily.     Polyvinyl Alcohol-Povidone (REFRESH OP) Place 1 drop into both eyes daily as  needed (dry eyes).     No current facility-administered medications for this visit.    REVIEW OF SYSTEMS:   10 Point review of Systems was done is negative except as noted above.  PHYSICAL EXAMINATION: ECOG FS:1 - Symptomatic but completely ambulatory  Vitals:   02/04/24 0945  BP: 116/75  Pulse: 77  Resp: 20  Temp: (!) 97.2 F (36.2 C)  SpO2: 99%    Wt Readings from Last 3 Encounters:  08/05/23 245 lb 8 oz (111.4 kg)  02/11/23 237 lb 4.8 oz (107.6 kg)  08/12/22 231 lb 14.4 oz (105.2 kg)   Body mass index is 39.84 kg/m.     GENERAL:alert, in no acute distress and comfortable SKIN: no acute rashes, no significant lesions EYES: conjunctiva are pink and non-injected, sclera anicteric OROPHARYNX: MMM, no exudates, no oropharyngeal erythema or ulceration NECK: supple, no JVD LYMPH:  no palpable lymphadenopathy in the cervical, axillary or inguinal regions LUNGS: clear to auscultation b/l with normal respiratory effort HEART: regular rate & rhythm ABDOMEN:  normoactive bowel sounds , non tender, not distended. Extremity: no pedal edema PSYCH: alert & oriented x 3 with fluent speech NEURO: no focal motor/sensory deficits   LABORATORY DATA:  I have reviewed the data as listed .    Latest Ref Rng & Units 02/04/2024    9:25 AM 08/05/2023    9:14 AM 05/29/2023    4:40 PM  CBC  WBC 4.0 - 10.5 K/uL 8.8  8.1  9.2   Hemoglobin 12.0 - 15.0 g/dL 56.2  13.0  86.5   Hematocrit 36.0 - 46.0 % 37.6  40.4  39.6   Platelets 150 - 400 K/uL 274  275  292     .    Latest Ref Rng & Units 02/04/2024    9:25 AM 08/05/2023    9:14 AM 02/11/2023    8:46 AM  CMP  Glucose 70 - 99 mg/dL 83  82  90   BUN 6 - 20 mg/dL 12  16  15    Creatinine 0.44 - 1.00 mg/dL 7.84  6.96  2.95   Sodium 135 - 145 mmol/L 138  137  137   Potassium 3.5 -  5.1 mmol/L 3.6  3.8  3.6   Chloride 98 - 111 mmol/L 103  106  102   CO2 22 - 32 mmol/L 27  23  23    Calcium 8.9 - 10.3 mg/dL 9.1  8.9  9.0   Total Protein  6.5 - 8.1 g/dL 7.3  7.5  7.7   Total Bilirubin 0.0 - 1.2 mg/dL 0.7  0.7  0.6   Alkaline Phos 38 - 126 U/L 60  53  51   AST 15 - 41 U/L 12  12  13    ALT 0 - 44 U/L 13  14  13     Sed rate 26 . Lab Results  Component Value Date   IRON  42 02/04/2024   TIBC 343 02/04/2024   IRONPCTSAT 12 02/04/2024   (Iron  and TIBC)  Lab Results  Component Value Date   FERRITIN 28 02/04/2024    08/13/2019 NM PET Image Restag (PS) Skull Base To Thigh (Accession [id number])   05/21/2019 ([id number]) Lymph Node Biopsy    RADIOGRAPHIC STUDIES: I have personally reviewed the radiological images as listed and agreed with the findings in the report. No results found.  10/06/2020 Surgical Pathology   ASSESSMENT & PLAN:   43 y.o. female with:  1) Classical Hodgkins lymphoma Likely stage IIIB based on imaging thus far. 2) Microcytic Anemia due to anemia of chronic disease and Iron  deficiency- hg improved to 13 3) Jehovah's Witness   PLAN:  -Discussed lab results on 02/04/24 in detail with patient. CBC normal, showed WBC of 8.8K, hemoglobin of 12.9, and platelets of 274K. -no anemia -iron  levels mildly low at time of last visit -last iron  labs showed ferritin 18, ferritin today up to 25 -she would prefer to continue oral iron  for replacement.  -continue Iron  polysaccharide 150mg    -discussed that she may need to take oral iron  3 days a week  -did not feel any enlarged lymph nodes during physical examination -continue to check iron  levels with PCP, let us  know if low. Discussed option of IV iron  down the line if needed -Patient has no clinical symptoms suggestive of Hodgkin's lymphoma progression at this time  -patient is more than 4 years out at this time and she shall return to clinic with us  once a year -continue to f/u with ObGyn to address her heavy periods  FOLLOW UP: RTC with Dr Salomon Cree with labs in 12 months  The total time spent in the appointment was 23 minutes* .  All of the  patient's questions were answered with apparent satisfaction. The patient knows to call the clinic with any problems, questions or concerns.   Jacquelyn Matt MD MS AAHIVMS [facility] Hematology/Oncology Physician [facility]  .*Total Encounter Time as defined by the Centers for Medicare and Medicaid Services includes, in addition to the face-to-face time of a patient visit (documented in the note above) non-face-to-face time: obtaining and reviewing outside history, ordering and reviewing medications, tests or procedures, care coordination (communications with other health care professionals or caregivers) and documentation in the medical record.    I,Mitra Faeizi,acting as a Neurosurgeon for Jacquelyn Matt, MD.,have documented all relevant documentation on the behalf of Jacquelyn Matt, MD,as directed by  Jacquelyn Matt, MD while in the presence of Jacquelyn Matt, MD.  .I have reviewed the above documentation for accuracy and completeness, and I agree with the above. .Punam Broussard Kishore Sevon Rotert MD

## 2024-04-17 ENCOUNTER — Encounter: Payer: Self-pay | Admitting: Gastroenterology

## 2025-02-09 ENCOUNTER — Ambulatory Visit: Payer: PRIVATE HEALTH INSURANCE | Admitting: Hematology

## 2025-02-09 ENCOUNTER — Other Ambulatory Visit: Payer: PRIVATE HEALTH INSURANCE
